# Patient Record
Sex: Female | Born: 1940 | Race: White | Hispanic: No | Marital: Married | Smoking: Former smoker
Health system: Southern US, Community
[De-identification: ages and names within clinical notes are randomized; demographics above are authoritative.]

## PROBLEM LIST (undated history)

## (undated) DIAGNOSIS — L723 Sebaceous cyst: Secondary | ICD-10-CM

## (undated) DIAGNOSIS — H353290 Exudative age-related macular degeneration, unspecified eye, stage unspecified: Secondary | ICD-10-CM

## (undated) DIAGNOSIS — K219 Gastro-esophageal reflux disease without esophagitis: Secondary | ICD-10-CM

## (undated) DIAGNOSIS — R32 Unspecified urinary incontinence: Secondary | ICD-10-CM

## (undated) DIAGNOSIS — J309 Allergic rhinitis, unspecified: Secondary | ICD-10-CM

## (undated) DIAGNOSIS — E559 Vitamin D deficiency, unspecified: Secondary | ICD-10-CM

## (undated) DIAGNOSIS — G4700 Insomnia, unspecified: Secondary | ICD-10-CM

## (undated) DIAGNOSIS — R010 Benign and innocent cardiac murmurs: Secondary | ICD-10-CM

## (undated) DIAGNOSIS — I10 Essential (primary) hypertension: Secondary | ICD-10-CM

## (undated) HISTORY — DX: Insomnia, unspecified: G47.00

## (undated) HISTORY — DX: Allergic rhinitis, unspecified: J30.9

## (undated) HISTORY — DX: Exudative age-related macular degeneration, unspecified eye, stage unspecified: H35.3290

## (undated) HISTORY — PX: CERVICAL SPINE SURGERY: SHX589

## (undated) HISTORY — DX: Unspecified urinary incontinence: R32

## (undated) HISTORY — PX: TUMOR REMOVAL: SHX12

## (undated) HISTORY — PX: APPENDECTOMY: SHX54

## (undated) HISTORY — DX: Gastro-esophageal reflux disease without esophagitis: K21.9

## (undated) HISTORY — DX: Essential (primary) hypertension: I10

## (undated) HISTORY — PX: BACK SURGERY: SHX140

## (undated) HISTORY — DX: Vitamin D deficiency, unspecified: E55.9

## (undated) HISTORY — DX: Benign and innocent cardiac murmurs: R01.0

## (undated) HISTORY — DX: Sebaceous cyst: L72.3

## (undated) HISTORY — PX: SHOULDER SURGERY: SHX246

---

## 1966-01-02 HISTORY — PX: PELVIC FRACTURE SURGERY: SHX119

## 1997-03-26 ENCOUNTER — Other Ambulatory Visit: Admission: RE | Admit: 1997-03-26 | Discharge: 1997-03-26 | Payer: Self-pay | Admitting: Gynecology

## 1998-05-03 ENCOUNTER — Other Ambulatory Visit: Admission: RE | Admit: 1998-05-03 | Discharge: 1998-05-03 | Payer: Self-pay | Admitting: Gynecology

## 1999-08-11 ENCOUNTER — Other Ambulatory Visit: Admission: RE | Admit: 1999-08-11 | Discharge: 1999-08-11 | Payer: Self-pay | Admitting: Gynecology

## 1999-12-06 ENCOUNTER — Other Ambulatory Visit: Admission: RE | Admit: 1999-12-06 | Discharge: 1999-12-06 | Payer: Self-pay | Admitting: Gynecology

## 2000-02-08 ENCOUNTER — Encounter: Admission: RE | Admit: 2000-02-08 | Discharge: 2000-03-02 | Payer: Self-pay | Admitting: Family Medicine

## 2000-09-10 ENCOUNTER — Other Ambulatory Visit: Admission: RE | Admit: 2000-09-10 | Discharge: 2000-09-10 | Payer: Self-pay | Admitting: Gynecology

## 2000-09-18 ENCOUNTER — Encounter: Admission: RE | Admit: 2000-09-18 | Discharge: 2000-09-18 | Payer: Self-pay | Admitting: Orthopaedic Surgery

## 2000-09-18 ENCOUNTER — Encounter: Payer: Self-pay | Admitting: Orthopaedic Surgery

## 2000-10-03 ENCOUNTER — Encounter: Admission: RE | Admit: 2000-10-03 | Discharge: 2000-10-03 | Payer: Self-pay | Admitting: Orthopaedic Surgery

## 2000-10-03 ENCOUNTER — Encounter: Payer: Self-pay | Admitting: Orthopaedic Surgery

## 2000-10-19 ENCOUNTER — Encounter: Payer: Self-pay | Admitting: Orthopaedic Surgery

## 2000-10-19 ENCOUNTER — Encounter: Admission: RE | Admit: 2000-10-19 | Discharge: 2000-10-19 | Payer: Self-pay | Admitting: Orthopaedic Surgery

## 2000-12-28 ENCOUNTER — Ambulatory Visit (HOSPITAL_COMMUNITY): Admission: RE | Admit: 2000-12-28 | Discharge: 2000-12-28 | Payer: Self-pay | Admitting: Neurological Surgery

## 2000-12-28 ENCOUNTER — Encounter: Payer: Self-pay | Admitting: Neurological Surgery

## 2001-01-08 ENCOUNTER — Encounter: Payer: Self-pay | Admitting: Neurological Surgery

## 2001-01-08 ENCOUNTER — Ambulatory Visit (HOSPITAL_COMMUNITY): Admission: RE | Admit: 2001-01-08 | Discharge: 2001-01-09 | Payer: Self-pay | Admitting: Neurological Surgery

## 2001-03-25 ENCOUNTER — Observation Stay (HOSPITAL_COMMUNITY): Admission: RE | Admit: 2001-03-25 | Discharge: 2001-03-26 | Payer: Self-pay | Admitting: Neurological Surgery

## 2001-03-25 ENCOUNTER — Encounter: Payer: Self-pay | Admitting: Neurological Surgery

## 2001-09-17 ENCOUNTER — Other Ambulatory Visit: Admission: RE | Admit: 2001-09-17 | Discharge: 2001-09-17 | Payer: Self-pay | Admitting: Gynecology

## 2002-03-21 ENCOUNTER — Encounter: Payer: Self-pay | Admitting: Orthopedic Surgery

## 2002-03-26 ENCOUNTER — Inpatient Hospital Stay (HOSPITAL_COMMUNITY): Admission: RE | Admit: 2002-03-26 | Discharge: 2002-03-29 | Payer: Self-pay | Admitting: Orthopedic Surgery

## 2002-11-17 ENCOUNTER — Other Ambulatory Visit: Admission: RE | Admit: 2002-11-17 | Discharge: 2002-11-17 | Payer: Self-pay | Admitting: Gynecology

## 2003-12-24 ENCOUNTER — Other Ambulatory Visit: Admission: RE | Admit: 2003-12-24 | Discharge: 2003-12-24 | Payer: Self-pay | Admitting: Gynecology

## 2004-08-12 ENCOUNTER — Ambulatory Visit (HOSPITAL_COMMUNITY): Admission: RE | Admit: 2004-08-12 | Discharge: 2004-08-12 | Payer: Self-pay | Admitting: *Deleted

## 2004-08-12 ENCOUNTER — Encounter (INDEPENDENT_AMBULATORY_CARE_PROVIDER_SITE_OTHER): Payer: Self-pay | Admitting: Specialist

## 2005-01-05 ENCOUNTER — Other Ambulatory Visit: Admission: RE | Admit: 2005-01-05 | Discharge: 2005-01-05 | Payer: Self-pay | Admitting: Gynecology

## 2006-02-05 ENCOUNTER — Other Ambulatory Visit: Admission: RE | Admit: 2006-02-05 | Discharge: 2006-02-05 | Payer: Self-pay | Admitting: Gynecology

## 2006-06-11 ENCOUNTER — Other Ambulatory Visit: Admission: RE | Admit: 2006-06-11 | Discharge: 2006-06-11 | Payer: Self-pay | Admitting: Gynecology

## 2006-11-26 ENCOUNTER — Other Ambulatory Visit: Admission: RE | Admit: 2006-11-26 | Discharge: 2006-11-26 | Payer: Self-pay | Admitting: Gynecology

## 2007-06-20 ENCOUNTER — Other Ambulatory Visit: Admission: RE | Admit: 2007-06-20 | Discharge: 2007-06-20 | Payer: Self-pay | Admitting: Gynecology

## 2008-02-03 ENCOUNTER — Inpatient Hospital Stay (HOSPITAL_COMMUNITY): Admission: EM | Admit: 2008-02-03 | Discharge: 2008-02-07 | Payer: Self-pay | Admitting: Emergency Medicine

## 2008-02-03 ENCOUNTER — Encounter: Admission: RE | Admit: 2008-02-03 | Discharge: 2008-02-03 | Payer: Self-pay | Admitting: Internal Medicine

## 2008-02-03 ENCOUNTER — Encounter (INDEPENDENT_AMBULATORY_CARE_PROVIDER_SITE_OTHER): Payer: Self-pay | Admitting: Surgery

## 2009-10-02 HISTORY — PX: COLONOSCOPY: SHX174

## 2010-02-02 HISTORY — PX: OTHER SURGICAL HISTORY: SHX169

## 2010-04-03 HISTORY — PX: DIAGNOSTIC MAMMOGRAM: HXRAD719

## 2010-04-19 LAB — COMPREHENSIVE METABOLIC PANEL
ALT: 15 U/L (ref 0–35)
AST: 23 U/L (ref 0–37)
Alkaline Phosphatase: 69 U/L (ref 39–117)
CO2: 27 mEq/L (ref 19–32)
Chloride: 92 mEq/L — ABNORMAL LOW (ref 96–112)
GFR calc Af Amer: >60 mL/min (ref >60)
GFR calc non Af Amer: >60 mL/min (ref >60)
Sodium: 131 mEq/L — ABNORMAL LOW (ref 135–145)
Total Bilirubin: 1.2 mg/dL (ref 0.3–1.2)

## 2010-04-19 LAB — CBC
Hemoglobin: 11.1 g/dL — ABNORMAL LOW (ref 12.0–15.0)
MCHC: 33.4 g/dL (ref 30.0–36.0)
MCV: 91.3 fL (ref 78.0–100.0)
Platelets: 136 10*3/uL — ABNORMAL LOW (ref 150–400)
RBC: 3.59 MIL/uL — ABNORMAL LOW (ref 3.87–5.11)
RBC: 4.55 MIL/uL (ref 3.87–5.11)
WBC: 12.1 10*3/uL — ABNORMAL HIGH (ref 4.0–10.5)
WBC: 7.7 10*3/uL (ref 4.0–10.5)
WBC: 8.6 10*3/uL (ref 4.0–10.5)

## 2010-04-19 LAB — DIFFERENTIAL
Basophils Absolute: 0 10*3/uL (ref 0.0–0.1)
Basophils Absolute: 0.1 10*3/uL (ref 0.0–0.1)
Eosinophils Absolute: 0 10*3/uL (ref 0.0–0.7)
Eosinophils Absolute: 0.1 10*3/uL (ref 0.0–0.7)
Eosinophils Relative: 1 % (ref 0–5)
Lymphocytes Relative: 10 % — ABNORMAL LOW (ref 12–46)
Lymphs Abs: 0.9 10*3/uL (ref 0.7–4.0)
Neutrophils Relative %: 84 % — ABNORMAL HIGH (ref 43–77)

## 2010-04-19 LAB — BASIC METABOLIC PANEL
CO2: 25 mEq/L (ref 19–32)
Calcium: 7.7 mg/dL — ABNORMAL LOW (ref 8.4–10.5)
GFR calc Af Amer: >60 mL/min (ref >60)
Sodium: 128 mEq/L — ABNORMAL LOW (ref 135–145)

## 2010-05-17 NOTE — H&P (Signed)
Brittney Stewart, Brittney Stewart             ACCOUNT NO.:  [id number]   MEDICAL RECORD NO.:  [id number]          PATIENT TYPE:  INP   LOCATION:  5118                         FACILITY:  [facility]   PHYSICIAN:  Thornton Park. Daphine Deutscher, MD  DATE OF BIRTH:  April 17, 1940   DATE OF ADMISSION:  02/03/2008  DATE OF DISCHARGE:                              HISTORY & PHYSICAL   ADMITTING PHYSICIAN:  Molli Hazard B. Daphine Deutscher, MD   PRIMARY CARE PHYSICIAN:  Dr. Ronne Binning.   CHIEF COMPLAINT:  Abdominal pain.   HISTORY OF PRESENT ILLNESS:  Brittney Stewart is a 67-year female patient with  significant past medical history for osteoarthritis and hypertension.  She began developing vague abdominal discomfort last Thursday.  By  Friday, she was having definite abdominal pain, diffuse in the  midabdomen, radiating out bilaterally.  On Friday evening, the patient  had a couple of episodes of nausea and vomiting and apparent high fever  greater than 101.  The patient thought she had a viral syndrome or food  poisoning, so did not seek immediate medical attention.  Unfortunately,  throughout the week, her pain increased in severity and she has  significant anorexia, although she was able to keep clear liquids down.  By this morning, her pain had moved more towards the right side, but it  was not definite right lower quadrant pain.  This prompted her to seek  attention from Dr. Ronne Binning who based on his clinical exam was concerned  she may have peritoneal signs and an acute abdomen, therefore sent her  for a CT scan.  This demonstrated an enlarged appendix, more centrally  located.  It did not appear to be perforated.  The patient was sent to  the ER.  Lab work revealed a white count of 12,100 without a left shift.  Surgical consultation has been requested for evaluate for acute  appendicitis.   REVIEW OF SYSTEMS:  As per the history of present illness, again the  patient's pain has been more centrally located in the abdomen.  She has  been able to keep down small amounts of clear liquids, no food.  She has  had significant anorexia and she had a couple of small BMs since the  onset of discomfort.   PAST MEDICAL HISTORY:  1. Hypertension.  2. Osteoarthritis.   PAST SURGICAL HISTORY:  1. Pelvic fracture with pinning in 1968 after she fell off a horse.  2. 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  3. Open lumbar laminectomy involving L5-L6.  4. Cervical laminectomy.  5. Humeral head replacement in 2000.   ALLERGIES:  PENICILLIN which causes hives.   CURRENT MEDICATIONS:  Benicar and Celebrex.   SOCIAL HISTORY:  No tobacco, limited social alcohol.  She continues to  work p.r.n. Psychologist, forensic.  She mainly helps care for her grandchild.   FAMILY MEDICAL HISTORY:  Noncontributory.   PHYSICAL EXAMINATION:  GENERAL:  A pleasant female patient complaining  of mid more localized today to the right side abdominal pain.  VITAL SIGNS:  Temperature 97.1, BP 110/67, pulse  76 and regular, and  respirations 16.  PSYCH:  The patient is alert and oriented x3.  Affect is appropriate to  current situation.  NEURO:  Cranial nerves II-XII are grossly intact.  She is moving all  extremities x4 without focal deficits.  Sclerae are noninjected,  nonicteric.  EARS, NOSE, AND THROAT:  Ears are symmetrical.  No otorrhea.  Nose is  midline.  No rhinorrhea.  Oral mucous membranes are pink, but dry.  No  palpable thyroid.  CHEST:  Bilateral lung sounds are clear to auscultation anteriorly.  Respiratory effort is nonlabored.  She is on room air with O2 sats 100%.  CARDIOVASCULAR:  Heart sounds are with a grade 1-2/6 systolic murmur,  best heard at the left sternal border, second intercostal space, does  not radiate up axilla.  Pulses are regular, not tachycardiac.  No JVD.  No peripheral edema.  ABDOMEN:  Soft.  Bowel sounds are present.  Abdomen is nondistended.  She is tender between the umbilicus  in the right lower quadrant.  She  has definite guarding with deep palpation.  No rebounding.  EXTREMITIES:  Symmetrical in appearance without cyanosis or clubbing.   LABORATORY DATA:  White count 12,100, hemoglobin 14.2, platelets  154,000; neutrophils 77%.   DIAGNOSTICS:  An EKG is pending.  Chest x-ray shows no acute process.  CT of the abdomen and pelvis as noted.   IMPRESSION:  Acute appendicitis.   PLAN:  1. Admit the patient, n.p.o. status, OR tonight with either Dr. Daphine Deutscher      or Dr. Ezzard Standing.  2. Treat symptoms and manage symptoms with IV fluid, IV morphine,      Phenergan, and Zofran.  3. Empiric antibiotics of Cipro and Flagyl due to the patient's      PENICILLIN allergy.  4. PAS hose, On call to OR for DVT prophylaxis.      Allison L. Gwyneth Sprout Daphine Deutscher, MD  Electronically Signed    ALE/MEDQ  D:  02/03/2008  T:  02/04/2008  Job:  [id number]   cc:   Dr. Ronne Binning

## 2010-05-17 NOTE — Op Note (Signed)
Brittney Stewart, Brittney Stewart             ACCOUNT NO.:  [id number]   MEDICAL RECORD NO.:  [id number]          PATIENT TYPE:  INP   LOCATION:  5118                         FACILITY:  [facility]   PHYSICIAN:  Sandria Bales. Ezzard Standing, M.D.  DATE OF BIRTH:  04/02/1940   DATE OF PROCEDURE:  02/03/2008  DATE OF DISCHARGE:                               OPERATIVE REPORT   Date of surgery ?   PREOPERATIVE DIAGNOSIS:  Acute appendicitis.   POSTOPERATIVE DIAGNOSIS:  Acute appendicitis with rupture and focal  abscess.   PROCEDURE:  Laparoscopic appendectomy.   SURGEON:  Sandria Bales. Ezzard Standing, MD   FIRST ASSISTANT:  None.   ANESTHESIA:  General endotracheal.   ESTIMATED BLOOD LOSS:  Minimal.   DESCRIPTION OF PROCEDURE:  Ms. Eblin is a 70 year old white female  patient of Dr. Thayer Headings, who presents with a 2-3-day history of  abdominal pain, which localized to her lower abdomen.  CT scan is  consistent with acute appendicitis.   I discussed with the patient about the indications and potential  complications of appendiceal surgery.  Potential complication include  bleeding, infection which I think she already has, the need for open  surgery, and possibility of bowel resection.   OPERATIVE NOTE:  The patient was placed in a supine position with a left  arm tucked, right arm out to side, Foley catheter in place, abdomen  prepped with CHG, and she was given cefoxitin at the initiation of the  procedure.   A time-out was held identifying the patient and procedure.  I accessed the abdominal cavity through an infraumbilical incision.  I  placed a 10-mm laparoscope through a 12-mm Hasson trocar and the Hasson  trocar was secured with a 0 Vicryl suture.  A 5-mm trocar was placed in  the right upper quadrant and an 11-mm trocar in the left lower quadrant  and abdominal exploration carried out.  Right and left lobes of liver  unremarkable.  Stomach unremarkable.  The bowel that I could see was  unremarkable  except in the right lower quadrant where she had an  inflamed mass.  I peeled omentum and small bowel back and stuck adjacent  to the right fallopian tube and ovary.  There was an inflammatory  mass/abscess, which was appendiceal abscess.   There was purulence around this.  I irrigated this area out.  I was able  to take the tip of the salpinx and tip of the mesentery down to the base  of the appendix.  I took a vascular load of the Endo-GIA 45 stapler  across the base of the appendix and divided the appendix.   I placed the appendix in the EndoCatch bag and delivered through the  umbilicus.  The midsegment of the appendix had ruptured.  There was a  phlebolith, which I found which was free floating.  I retrieved this  using the stone scooping device.   I then irrigated the abdomen with 2.5 L and tried to irrigate as much of  the purulence as I could as possible.  I saw no loculations.  Again, she  had a lot  of inflammation around her right tubo-ovarian complex and the  distal ileum.   I then removed the trocars.  In turn, the umbilical port was closed with  0 Vicryl suture.  The skin edge was closed with 5-0 Vicryl suture,  painted with tincture of benzoin and Steri-stripped.  The patient  tolerated the procedure well and was transported to recovery room in  good condition.  Sponge and needle counts were correct at the end of the  case.      Sandria Bales. Ezzard Standing, M.D.  Electronically Signed     DHN/MEDQ  D:  02/03/2008  T:  02/04/2008  Job:  [id number]   cc:   Thayer Headings, M.D.  Leatha Gilding. Mezer, M.D.  Courtney Paris, M.D.  Griffith Citron, M.D.

## 2010-05-17 NOTE — Discharge Summary (Signed)
Brittney Stewart, Brittney Stewart             ACCOUNT NO.:  [id number]   MEDICAL RECORD NO.:  [id number]          PATIENT TYPE:  INP   LOCATION:  5149                         FACILITY:  [facility]   PHYSICIAN:  Thornton Park. Daphine Deutscher, MD  DATE OF BIRTH:  05/11/1940   DATE OF ADMISSION:  02/03/2008  DATE OF DISCHARGE:  02/07/2008                               DISCHARGE SUMMARY   ADMITTING PHYSICIAN:  Sandria Bales. Ezzard Standing, MD   DISCHARGING PHYSICIAN:  Dr. Daphine Stewart.   CHIEF COMPLAINT AND REASON FOR ADMISSION:  Brittney Stewart is a 70 year old  female patient who began developing vague abdominal pain on Thursday  prior to presentation.  By the next day, she was having significant pain  associated with nausea and vomiting and fevers greater than 101.  The  patient told she had a viral syndrome, so did not seek immediate medical  attention.  Unfortunately, her symptoms had worsened and had become more  focalized to the right side.  By the date of admission, she saw her  family doctor, Dr. Gillermina Phy, who felt the patient may have an acute  abdomen plus appendicitis, so she was sent for an outpatient CT.  This  demonstrated an enlarged appendix, more centrally located, with a  phlegmonous change around it but did not appear to be perforated on the  CT scan.  Her white count was 12,100 from the doctor's office.  She was  sent to the ER for evaluation by Surgery for acute appendicitis.   On exam, the patient's vital signs were stable.  Her abdomen revealed a  soft abdomen that was nondistended with bowel sounds present, but she  was tender focally with guarding between the umbilicus and the right  lower quadrant.  No rebounding.   ADMITTING DIAGNOSIS:  Acute appendicitis.   HOSPITAL COURSE:  The patient was admitted, placed on n.p.o. status,  started on IV fluids, and given empiric Cipro and Flagyl due to having  the PENICILLIN allergy.  She was later evaluated by Dr. Ezzard Standing who  agreed that the patient had acute  appendicitis, and she was subsequently  taken to the OR on the day of admission where she underwent laparoscopic  appendectomy for ruptured appendicitis with focal abscess.  Pictures  were taken, are in the chart.  She was irrigated with 2-1/2 liters of  saline, tolerated the procedure well, and was sent back to the general  floor to recover.  Over the next several days, the patient progressed  nicely.  Her white count decreased to 8.6.  She was troubled initially  with a subtle ileus and high fevers up to 103 for the first 2 days, but  she did have bowel sounds present.  On postop day 1, she was actually  started on clear liquids and Toradol was added to help with her pain  management.  She was placed on cefoxitin in the postoperative period and  was continued up until discharge.   By postoperative day 3, the patient's diet was advanced to a solid.  She  was started on Vicodin and IV fluids were decreased.  Plans were to  possibly discharge home on postop day 4 with no problems.   On postop day 4, the patient was stable.  Vital signs were present.  She  had defervesced with no further fevers in over 24 hours.  The other  issue she had was, on postop day 3, she was complaining of a focal  papular rash that looked more like a small pimple/folliculitis on the  upper back.  This was very itchy in nature but did not appear to be  consistent with a drug reaction.  From a surgical standpoint, she was  tolerating a solid diet.  Her trocar sites were clean and dry.  There  was no redness, and plans were to discharge the patient home.   FINAL DISCHARGE DIAGNOSES:  1. Acute abdomen secondary to perforated appendicitis with      peritonitis.  2. Status post laparoscopic appendectomy on February 03, 2008, by Dr.      Ovidio Kin.  3. Mild upper back folliculitis.   DISCHARGE MEDICATIONS:  The patient will resume the following home  medications:  1. Benicar 40 mg daily.  2. Celebrex 200 mg  daily.  3. Aspirin 81 mg daily.  4. Allegra daily.  5. Vitamin C daily.  6. Vitamin D daily.  7. Vitamin B12 daily.  8. Calcium daily.  9. Zinc daily.  10.Co-Q10 daily.  11.Fish oil daily.  12.Simcor daily.   NEW MEDICATIONS:  1. Cipro 500 mg b.i.d. for the next 4 days.  2. Flagyl 500 mg t.i.d. for the next 4 days.  3. Vicodin 5/325 one to two tablets every 4 hours as needed for pain.  4. Over-the-counter ibuprofen 1-3 tablets every 8 hours as needed for      pain with instructions to not to take ibuprofen if she is taking      her Celebrex regularly.   ACTIVITY AND WOUND CARE:  Please refer to the home care instructions for  laparoscopic procedures from [facility].  She is to not return  to work and usual activities for 2 weeks.  No lifting greater than 10  pounds for 2 weeks.  She is to call the physician's office if she has  redness, drainage, or swelling in her wounds, temperature greater than  101 degrees Fahrenheit, or pain that is not relieved by prescribed  products.   FOLLOWUP:  She is to see [facility] at [facility] on February 18, 2008, at 3:15 p.m.  In addition, per Dr. Allene Pyo request, I have  given the patient a copy of her pathology report.  Her pathology shows  acute appendicitis without evidence of malignancy.      Brittney L. Gwyneth Sprout Daphine Deutscher, MD  Electronically Signed    ALE/MEDQ  D:  02/07/2008  T:  02/07/2008  Job:  [id number]   cc:   Sandria Bales. Ezzard Standing, M.D.  Dr. Gillermina Phy

## 2010-05-20 NOTE — Discharge Summary (Signed)
NAMEVESTAL, CRANDALL                         ACCOUNT NO.:  [id number]   MEDICAL RECORD NO.:  [id number]                   PATIENT TYPE:  INP   LOCATION:  5028                                 FACILITY:  [facility]   PHYSICIAN:  Dyke Brackett, M.D.                 DATE OF BIRTH:  Apr 07, 1940   DATE OF ADMISSION:  03/26/2002  DATE OF DISCHARGE:  03/29/2002                                 DISCHARGE SUMMARY   ADMISSION DIAGNOSES:  1. End-stage osteoarthritis, left shoulder.  2. Hypertension.  3. Hypercholesterolemia.   DISCHARGE DIAGNOSES:  1. Left glenohumeral hemiarthroplasty.  2. Left rotator cuff tear repair.  3. Hypertension.  4. Hypercholesterolemia.   HISTORY OF PRESENT ILLNESS:  A 70 year old white female who presents with  progressively worsening left shoulder pain over the past 10 years or so.  She does have a remote history of falling off a horse in 1968; however, she  is unsure if she injured her shoulder at that time.  She has had no other  surgery or injury to her left shoulder.   She describes her shoulder pain as a constant, dull sensation, located  mostly over the anterior aspect of the deltoid with radiation into the  pectoralis major, and the pain also radiates down into left arm.  The pain  is increased with activity and certain motion.  Also increased if she rides  in a car for a long period of time or if her shoulder __________.  Her pain  improves with analgesic rubbing cream to the shoulder and hot showers.  She  is currently taking Vicodin for the pain, and this gives her a moderate  amount of pain relief.  Left shoulder seems to be locking in certain  positions and popping and grinding.  The pain keeps her up at night.  She  has tried a cortisone injection in the past that provided her with moderate  relief; however, her last cortisone injection into the shoulder did not give  her any relief.   ALLERGIES:  PENICILLIN causes hives and edema.   CURRENT  MEDICATIONS:  1. Lotrel 5/10 mg one tab p.o. daily.  2. Maxzide 25 mg one tab p.o. daily.  3. Tri-Chlor 160 mg one tab p.o. daily.  4. Vioxx 25 mg one tab p.o. daily.  5. Allegra 180 mg one tab p.o. daily p.r.n.  6. Ambien 10 mg one tab p.o. q.h.s. p.r.n.  7. Vicodin 5/500 mg one tab p.o. q.h.s. p.r.n. pain.  8. Humibid L.A. 600 mg one tab p.o. daily p.r.n.  9. Flonase spray one squirt to each naris p.r.n. congestion.  10.      Vitamin B-100 Complex one tab p.o. daily.  11.      Vitamin C 1000 mg p.o. daily.  12.      Vitamin D 400 International Units p.o. daily.  13.      Vitamin E 1000  International Units p.o. daily.  14.      Glucosamine sulfate 15/1200 mg one tablet p.o. daily.  15.      Aspirin 81 mg one tablet p.o. daily.  16.      Calcium 600 mg one tab p.o. daily.  17.      Zinc 50 mg one tab p.o. daily.   SURGICAL PROCEDURE:  Patient was taken to the OR by Dr. Frederico Hamman,  assisted by Dr. Lewayne Bunting and Madilyn Fireman, P.A.-C.  Patient was placed  under general anesthesia, and a left glenohumeral hemiarthroplasty and  repair of rotator cuff tear were performed.  Patient tolerated the procedure  well, returned to the postoperative care unit in good, stable condition.   CONSULTS:  PT, OT, case management consults were all obtained during the  patient's hospital stay.   HOSPITAL COURSE:  Patient remained stable and afebrile postop.  She had no  complications during her stay.  She did develop postoperative anemia  secondary to surgery with H&H 9.3/27.8 on postoperative day one; however,  she was asymptomatic by the second postop day, her H&H was 9.8/28.7.  Patient did have a positive UA with 20 to 50 white blood cells and a  moderate amount of leukocyte esterase, and also positive for nitrites  preoperatively and was placed on antibiotics preoperatively.  UA was checked  while patient was in the hospital, and it came back negative.  On  postoperative day three, patient  was discharged home in good, stable  condition.   LABORATORY DATA:  CBC preoperatively, white blood count 5.2, hemoglobin  12.6, hematocrit 36.5, platelets 196.  Preoperative routine chemistries,  sodium 137, potassium 4.5, chloride 104, bicarb 27, glucose 95, BUN 24,  creatinine 0.8.  Preoperative PT was 13.1, INR was 0.9, and PTT was 29.   Preoperative UA on 03/21/2002 was positive for nitrites, leukocyte esterase  moderate, white blood cells 20 to 50.   Preoperative chest x-ray, no active disease, mild scarring noted at the  right lung base.  Preoperative EKG, sinus bradycardia; otherwise normal EKG, 56 beats per  minute, P-R intervals of 170 msec, P, R, and T axes of 71/61/55.   MEDICATIONS ON FLOOR:  1. Percocet 5 mg one to two tabs p.o. q.4-6h. p.r.n. pain.  2. Norvasc 5 mg p.o. daily.  3. Lotensin 10 mg p.o. daily.  4. Maxzide/Diazide one tab p.o. daily.  5. Tri-Chlor 160 mg p.o. q.18h.  6. Vioxx 25 mg p.o. daily.  7. Allegra 180 mg p.o. daily.  8. Guaifenesin 600 mg p.o. daily.  9. Cleocin 600 mg IV times three days.  10.      Flonase one squirt to each naris p.r.n. congestion.   DISCHARGE MEDICATIONS:  Patient is to resume home meds and to add the  following:  1. Aspirin 81 mg, enteric-coated, one daily.  2. OxyContin 10 mg SR one tablet every 12 hours as needed for pain.  3. Percocet 5 mg one to two tabs every four to six hours as needed for pain.   ACTIVITY:  No overhead movements with left arm.   DIET:  No restrictions.   CONDITION ON DISCHARGE:  Patient is discharged in good condition to home.   FOLLOW UP:  Patient is to follow up with Dr. Madelon Lips in approximately 10  days for staple removal; patient is to call office for appointment, [phone].   WOUND CARE:  1. Keep wound clean and dry and change dressings daily.  2.  Check wound daily for signs of infection.  Call Dr. Candise Bowens office if    temperature greater than 100.5, chills, pain not controlled with  meds,     foul-smelling discharge from the wound, or if swelling occurs.  3. Patient may shower after two days with no drainage from the wound site.   SPECIAL INSTRUCTIONS:  Patient is to perform pendulum exercises three times  per day.  She may take her arm out of the sling to move her elbow.     Richardean Canal, Arnetha Courser, M.D.    GC/MEDQ  D:  04/29/2002  T:  04/29/2002  Job:  [id number]

## 2010-05-20 NOTE — Op Note (Signed)
Brittney Stewart, Brittney Stewart                         ACCOUNT NO.:  [id number]   MEDICAL RECORD NO.:  [id number]                   PATIENT TYPE:  INP   LOCATION:  2899                                 FACILITY:  [facility]   PHYSICIAN:  Thera Flake., M.D.             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   POSTOPERATIVE DIAGNOSIS:  1. 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  2. Small tear, rotator cuff.   OPERATION PERFORMED:  1. Left glenohumeral hemiarthroplasty, global shoulder with 40 x 21 head and     size 10 stem.  2. Repair of rotator cuff tear.   SURGEON:  Dyke Brackett, M.D.   ASSISTANT:  Claude Manges. Cleophas Dunker, M.D.   ESTIMATED BLOOD LOSS:  Approximately 150 ml.   DESCRIPTION OF PROCEDURE:  Extended deltopectoral approach was made with  partial release and later repair of the pectoralis muscle.  0 degrees of  external rotation was present, extreme amount of osteophytes, particularly  along the anterior edge of the humerus.  A severe head deformity was present  as well.  Glenoid fortunately appeared to be in reasonably good shape with  only mild fibrillation of the posterior one third of the glenoid.  Decision  made at that point to proceed with a hemiarthroplasty.  Small rent in the  rotator cuff was repaired on closing the shoulder with nonabsorbable  sutures.  Again, a deltopectoral approach was made with splitting of the  clavipectoral fascia, retraction of conjoined tendon medially, dissection of  the capsule away from the subscapularis muscle with tagging of the  subscapularis.  The head was cut resecting a very minimal amount of bone,  given the severe head deformity, about 45 degrees relative to the shaft and  about 25 to 30 degrees of  retroversion.  This made for essentially an  anatomic position of the humeral head and once it was reduced with trial  reduction with the arm at neutral.  The canal was progressively sized with a  hand reamer up to a size 10 stem, followed by placement of broaches and the  trial of the same size.  Again the top of the head was slightly above the  edge of the cuff.  There was good bone fit relative to the head coverage of  the cut as well as appropriate version.  Final prosthesis was inserted.  Initially the middle thickness head was used.  In point of fact, the  prosthesis itself may have impacted slightly down compared to the trial.  For this reason, the 21 head had to be used instead of the 17 head; however,  with the arm at neutral, the head could be only 50% subluxed.  There was no  overstuffing  of the joint present.  The prosthesis did not dislocate  posteriorly with the arm internally rotated and with the subscapularis  muscle trial apposed to the bone, there was good anterior stability.  Prior  to placement of the prosthesis, loops of suture were placed through drill  holes in order  to later reattach the subscapularis muscle to bone holes as well as to  direct suture techniques to the tendon.  A small cuff tear was repaired and  the wound prior to this was irrigated.  2-0 Vicryl, 0 Vicryl in the  subcutaneous tissue, skin staples were used on the skin.  Marcaine without  epinephrine to the skin and shoulder immobilizer was applied.                                                Thera Flake., M.D.    WDC/MEDQ  D:  03/26/2002  T:  03/26/2002  Job:  [id number]

## 2010-05-20 NOTE — Op Note (Signed)
Brittney Stewart, COSTANTINO             ACCOUNT NO.:  [id number]   MEDICAL RECORD NO.:  [id number]          PATIENT TYPE:  AMB   LOCATION:  ENDO                         FACILITY:  [facility]   PHYSICIAN:  Georgiana Spinner, M.D.    DATE OF BIRTH:  12-19-1940   DATE OF PROCEDURE:  08/12/2004  DATE OF DISCHARGE:                                 OPERATIVE REPORT   PROCEDURE:  Upper endoscopy with biopsy.   INDICATIONS:  Gastroesophageal reflux disease.   ANESTHESIA:  Demerol 40, Versed 5 mg.   DESCRIPTION OF PROCEDURE:  With the patient mildly sedated in the left  lateral decubitus position, the Olympus videoscopic endoscope was inserted  in the mouth, passed under direct vision through the esophagus which  appeared normal except the question of Barrett's seen in the distal  esophagus which was photographed and subsequently biopsied. We then entered  into the stomach, fundus, body, antrum, duodenal bulb, second portion of  duodenum were visualized. From this point, the endoscope was slowly  withdrawn taking circumferential views of the duodenal mucosa until the  endoscope had been pulled back into the stomach, placed in retroflexion to  view the stomach from below. The endoscope was straightened and withdrawn  taking circumferential views of the remaining gastric and esophageal mucosa  stopping in the antrum to biopsy an erythematous folds. The patient's vital  signs and pulse oximeter remained stable. The patient tolerated the  procedure well without apparent complications.   FINDINGS:  Erythematous antral fold biopsied, question of Barrett's  esophagus with widely patent GE junction indicating laxity of the  gastroesophageal sphincter.   PLAN:  Await biopsy report. The patient will call me for results and follow-  up with me as an outpatient. Proceed to colonoscopy.       GMO/MEDQ  D:  08/12/2004  T:  08/12/2004  Job:  [id number]

## 2010-05-20 NOTE — Op Note (Signed)
[place]  Patient:    Brittney Stewart, Brittney Stewart Visit Number: [id number] MRN: [id number]          Service Type: DSU Location: 3000 3019 [id number] Attending Physician:  Jonne Ply Dictated by:   Brittney Stewart, M.D. Proc. 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  OPERATION PERFORMED:  Anterior cervical diskectomy and arthrodesis, C5-6, C6-7, structural allograft, Synthes plate fixation.  SURGEON:  Brittney Stewart, M.D.  ASSISTANT:  Dr. Newell Coral.  ANESTHESIA:  General endotracheal.  INDICATIONS FOR PROCEDURE:  The patient is a 70 year old individual who has had significant neck, shoulder and arm pain particularly with weakness in the left shoulder.  She has profound cord compression at the C5-6 level and she has weakness in the deltoid and biceps and supraspinatus muscle on that left side.  She is advised regarding surgical decompression at C5-6 and  C6-7 where spondylitic process was particularly severe.  DESCRIPTION OF PROCEDURE:  The patient was brought to the operating room and placed on the table in supine position.  After smooth induction of general endotracheal anesthesia she was placed in five pounds of halter traction.  The neck was prepped with DuraPrep and draped in a sterile fashion.  A transverse incision was made on the left side of the neck and carried down to the platysma.  The plane between the sternocleidomastoid and strap muscles was dissected bluntly until the prevertebral space was reached.  The first identifiable disk space was noted to be that of C5-6 on a radiograph. Dissection was then carried down to C6-7 and then by placing Caspar retractors under the longus  colli muscle, the C5-6 disk space was opened.  Ventral osteophytes were removed with a Leksell rongeur.  The disk space was evacuated of a significant quantity of markedly degenerated disk material.  The decompression was then obtained by removing large bony osteophytes from the inferior margin of the body of C5 and the uncinate process particularly on th4 left side at the C5-6 level.  Both sides were cleared and subligamentous disk space was cleared also.  Once the posterior longitudinal ligament was identified and hemostasis was achieved in this region, the space was checked for its smoothness on either surface and a 7 mm tricortical graft was placed with the cortical surface facing dorsally.  The ventral aspects of the bone graft were trimmed.  C6-7 was treated in a similar fashion.  Here significant osteophytic process was again noted more prominent on the left side than on the right side.  Once again, plates were cleaned completely.  Again, a 7 mm tricortical graft was placed in an inverted fashion with the cortical surface facing dorsally.  After ventral aspects were prepared, a 40 mm standard size Synthes plate was contoured to the appropriate size and shape and fixed with six locking 4 x 14 mm screws which were drilled and tapped individually. Hemostasis in the soft tissue was then obtained.  A localizing radiograph identified good position of the construct.  The area was checked again for hemostasis and the wound was closed with 3-0 Vicryl in the platysma and 3-0 Vicryl in the subcuticular tissues.  The patient  tolerated the procedure well and was returned to the recovery room in stable condition. Dictated by:   Brittney Stewart, M.D. Attending Physician:  Jonne Ply DD:  01/08/01 TD:  01/08/01 Job: [id number]

## 2010-05-20 NOTE — Op Note (Signed)
Brittney Stewart, Brittney Stewart             ACCOUNT NO.:  [id number]   MEDICAL RECORD NO.:  [id number]          PATIENT TYPE:  AMB   LOCATION:  ENDO                         [facility]ACILITY:  [facility]   PHYSICIAN:  Georgiana Spinner, M.D.    DATE OF BIRTH:  1940-10-25   DATE OF PROCEDURE:  08/12/2004  DATE OF DISCHARGE:                                 OPERATIVE REPORT   PROCEDURE:  Colonoscopy.   INDICATIONS:  Colon cancer screening.   ANESTHESIA:  Versed 2 mg.   DESCRIPTION OF PROCEDURE:  With the patient mildly sedated in the left  lateral decubitus position, the Olympus videoscopic colonoscope was inserted  in the rectum and passed under direct vision to cecum identified by the  ileocecal valve and appendiceal orifice both of which were photographed.  From this point, the colonoscope was slowly withdrawn taking circumferential  views of colonic mucosa after first entering into the terminal ileum which  was photographed and appeared normal. The endoscope was then as stated  withdrawn all the way to the rectum which appeared normal on direct and  retroflexed view. The endoscope was straightened and withdrawn. The  patient's vital signs and pulse oximeter remained stable. The patient  tolerated the procedure well without apparent complication.   FINDINGS:  Unremarkable examination including terminal ileum.   PLAN:  Consider repeat examination in five years       GMO/MEDQ  D:  08/12/2004  T:  08/12/2004  Job:  [id number]

## 2010-05-20 NOTE — H&P (Signed)
NAME:  BETSAIDA, [place]                       ACCOUNT NO.:  [id number]   MEDICAL RECORD NO.:  [id number]                   PATIENT TYPE:  INP   LOCATION:  NA                                   FACILITY:  [facility]   PHYSICIAN:  Marcie Mowers, M.D.             DATE OF BIRTH:  05/07/1940   DATE OF ADMISSION:  03/26/2002  DATE OF DISCHARGE:                                HISTORY & PHYSICAL   CHIEF COMPLAINT:  Left shoulder pain for the last ten years.   HISTORY OF PRESENT ILLNESS:  This 70 year old white female patient presents  to Dr. Madelon Lips with a history of gradual onset of progressively worsening  left shoulder pain over the last ten or so years.  She does have a remote  history of falling off of a horse in 1968, and she is not sure if she  injured her shoulder at that time, but she has had no other surgery or other  injury to her shoulder.   At this point, the pain in her shoulder is described as a constant dull  sensation located mostly over the anterior aspect of the deltoid with  radiation into the pectoris major and then also distally down her arm into  her left hand.  The pain increases with activities and certain motions and  then also if she rides in a car for a long period of time or if the shoulder  gets cold.  The pain does decrease with a hot shower and also rubbing  analgesic cream to the shoulder.  She is currently taking Vicodin for pain  and that provides a moderate amount of relief even though she is only using  that at bedtime.  She does complain of popping and grinding of the shoulder  and also of the shoulder locking in a certain position at times where she  has to move it to get it moving again.  The pain also keeps her up at night.  She has had cortisone injections in the past in that shoulder and they  provided a moderate amount of relief until the last time when it really did  not help at all.  She does play piano and she has had difficulty with that  because of the shoulder.   ALLERGIES:  PENICILLIN causes hives and edema.   CURRENT MEDICATIONS:  1. Lotrel 5/10 mg one tablet p.o. daily.  2. Maxzide 25 mg one tablet p.o. daily.  3. TriCor 160 mg one tablet p.o. daily.  4. Vioxx 25 mg one tablet p.o. daily.  5. Allegra 180 mg one tablet p.o. daily p.r.n.  6. Ambien 10 mg one tablet p.o. q. bedtime p.r.n.  7. Vicodin 5/500 mg one tablet p.o. q. bedtime p.r.n. pain.  8. Humibid LA 600 mg one tablet p.o. daily p.r.n.  9. Flonase nasal spray one squirt to each naris p.r.n. congestion.  10.      Vitamin B  100 complex one tablet p.o. daily.  11.      Vitamin C 1000 mg p.o. daily.  12.      Vitamin D 400 international units p.o. daily.  13.      Vitamin E 1000 international units p.o. daily.  14.      Chondroitin and glucosamine sulfate 1500/1200 mg one tablet p.o.     daily.  15.      Aspirin 81 mg one tablet p.o. daily.  Last dose prior to March 18, 2002.  16.      Calcium 600 mg one tablet p.o. daily.  17.      Zinc 50 mg one tablet p.o. daily.   PAST MEDICAL HISTORY:  She was diagnosed with hypertension 15 years ago.  She has mild hypercholesterolemia which has been improved with her  medications.  She denies any history of diabetes mellitus, thyroid disease,  hiatal hernia, peptic ulcer disease, reflux disease, asthma, or any other  chronic medical condition other than noted previously.   PAST SURGICAL HISTORY:  1. Tonsillectomy in 1950.  2. Extraction of wisdom teeth in 1967.  3. Open reduction of pelvic fractures in 1968, by Dr. Delford Field and Dr. Fannie Knee.  4. Natural childbirth in 1969, by Dr. Arletha Grippe.  5. 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     __________.  6. Repair of a deviated septum by Dr. Shon Hough in 1982.  7. C4-5 cervical fusion by Dr. Barnett Abu in January 2003.  8. Bilateral lumbar decompression of L4-5 and S1 by Dr. Barnett Abu.  9. Removal of left eye cataract with lens implant  in 1998, by Dr. Emmit Pomfret.  10.      Excision of a bone spur on the right toes by Dr. Charlsie Merles.   She denies any complications from the above-mentioned procedures.   SOCIAL HISTORY:  She has about a five pack-year history of cigarette smoking  which she quit in 1968.  She drinks alcohol about two glasses a week.  She  does not use any drugs.  She is married and has one son.  She and her  husband live in a one story house in [place] and a two story house in  [place] with a couple of steps into the main entrance.  She is a retired  Technical sales engineer and Engineer, agricultural.  Her medical doctor is Dr. Aggie Cosier, and his  phone number is [phone].   FAMILY HISTORY:  Her mother is alive at age 16 with osteoarthritis,  hypertension, and hypercholesterolemia.  Her father passed away at age 70  with a stroke and hypertension.  She has three sisters, ages 56, 75, and 37,  and they are healthy.  Her son is age 33 and he is healthy.   REVIEW OF SYSTEMS:  She did have a problem with blurred vision due to  Claritin.  She has seasonal allergies in the spring and fall.  She does have  a history of pneumonia but nothing recent.  She complains of occasional  constipation treated easily with over-the-counters.  She does complain of  occasional urinary frequency and stress incontinence.  She does wear  glasses.  She does not have a living will nor a power-of-attorney.  All  other systems are negative and noncontributory.   PHYSICAL EXAMINATION:  GENERAL:  This is a well-developed, well-nourished,  thin white female in no acute distress.  She talks easily with the examiner.  She walks with a normal gait.  Mood and affect are appropriate.  VITAL SIGNS;  Height 5 feet 8 inches, weight 170 pounds, body mass index 25.  Temperature 95.9 degrees Fahrenheit, pulse 64, respirations 18, and BP  114/52.  HEENT:  Normocephalic and atraumatic without frontal or maxillary sinus tenderness to palpation.  Conjunctivae pink.   Sclerae anicteric.  PERRLA.  EOMs intact.  No visible external ear deformities.  Hearing grossly intact.  Right ear canal occluded with cerumen.  Left tympanic membrane pearly gray.  Nose and nasal septum midline.  Nasal mucosa pink and moist without exudates  or polyps noted.  Buccal mucosa pink and moist.  Good dentition.  Pharynx  without erythema or exudates.  Tongue and uvula midline.  Tongue without  fasciculations.  Uvula rises equally with phonation.  NECK:  She has a well-healed, left-sided, very faint scar from the base of  her neck.  Trachea midline.  No palpable lymphadenopathy nor thyromegaly.  Carotids +2 bilaterally without bruits.  Full range of motion.  Nontender to  palpation along the cervical spine.  CARDIOVASCULAR:  Heart rate and rhythm regular.  S1 and S2 present without  rubs, clicks, or murmurs noted.  RESPIRATORY:  Respirations even and unlabored.  Breath sounds clear to  auscultation bilaterally without rales or wheezes noted.  ABDOMEN:  Rounded abdominal contour.  Bowel sounds present x4 quadrants.  Soft and nontender to palpation without hepatosplenomegaly or CVA  tenderness.  Femoral pulses +2 bilaterally.  BACK:  Nontender to palpation along the entire length of the vertebral  column.  BREASTS:  Deferred at this time.  GENITOURINARY:  Deferred at this time.  RECTAL:  Deferred at this time.  PELVIC:  Deferred at this time.  MUSCULOSKELETAL:  No obvious deformities of the bilateral upper extremities.  She has full range of motion of her right shoulder and arm without pain.  She has full range of motion of the left elbow, wrists, and fingers.  The  radial pulses are +2.  No pain with palpation about the shoulders  bilaterally.  She does have some crepitance with range of motion of the left  shoulder.  She only has active forward flexion of that left shoulder to 30  degrees.  Passively I can get it to 90.  Abduction stops on that left  shoulder at about 30  degrees and she has minimal to no internal or external  rotation.  It is intact over the shoulder and no real pain with palpation  about the shoulder at this time.  She has full range of motion of her hips,  knees, ankles, and toes bilaterally.  DP and PT pulses are +2.  No lower  extremity edema.  NEUROLOGICAL:  Alert and oriented x3.  Cranial nerves II-XII are grossly  intact.  Strength 5/5 in the bilateral upper and lower extremities.  Rapid  alternating movements intact.  Deep tendon reflexes 2+ in the bilateral  upper and lower extremities.  Sensation intact to light touch.   RADIOLOGICAL DATA:  X-rays taken in September 2002, of her left shoulder  showed considerable osteoarthritis of the humeral head with a large inferior  osteophyte and sclerosis in addition to flattening of the head.  Repeat  x-  ray and MRI in June 2003, showed an end-stage osteoarthritic shoulder with  large osteophytes and significant restriction of motion.  IMPRESSION:  1. End-stage osteoarthritis, left shoulder.  2. Hypertension.  3. Hypercholesterolemia.   PLAN:  The patient will  be admitted to [facility] on March 26, 2002, where she will undergo a left total versus hemishoulder arthroplasty  by Dr. Lacretia Nicks. Frederico Hamman.  She will undergo all of the routine preoperative  laboratory tests and studies prior to this procedure.     Legrand Pitts Duffy, P.A.-C                    Marcie Mowers, M.D.    KED/MEDQ  D:  03/18/2002  T:  03/18/2002  Job:  [id number]

## 2010-05-20 NOTE — Op Note (Signed)
[place]  Patient:    Brittney Stewart, Brittney Stewart Visit Number: [id number] MRN: [id number]          Service Type: SUR Location: 3000 3032 01 Attending Physician:  Jonne Ply Dictated by:   Stefani Dama, M.D. Proc. 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  PROCEDURE:  Lumbar laminectomy, bilateral foraminotomies with Met-Rx and microscope using microdissection technique, L4-L5.  SURGEON:  Stefani Dama, M.D.  FIRST ASSISTANT:  Kyle L. Franky Macho, M.D.  ANESTHESIA:  General endotracheal.  INDICATIONS:  The patient is a 70 year old individual who has had significant back and bilateral leg pain.  She has also had some incipient weakness of the lower extremities.  She has a very stenosis at the L4-L5 level.  She has been advised as to surgical intervention.  DESCRIPTION OF PROCEDURE:  The patient was brought to the operating room, supine on the stretcher.  After the onset of general endotracheal anesthesia, she was turned prone.  The back was shaved, prepped with DuraPrep, and draped in a sterile fashion.  Using fluoroscopic guidance, then L4-L5 was localized, first in the PA plane and then in the lateral plane.  The central area of the back was then infiltrated with 1% lidocaine with epinephrine for a total of 4 cc.  A linear incision was created in this region, measuring approximately 25 mm in length.  Subcutaneous dissection was then obtained, and hemostasis in the subcutaneous tissues was obtained.  Then by dissecting to the right side and then to the left side, the paraspinous musculature was opened, and a K-wire was placed down to the laminar arch of L4.  Using a winding technique, a series  of dilators were then placed, first on the left side at L4-5, and an 18 mm x 5 cm deep endoscopic cannula was placed and fixed to the operating table.  Microscope was then used with microdissection technique.  The laminar arch of L4 was cleared and using a high-speed air drill with 2.3 mm dissecting tool, the inferior arch of the lamina of L4 was removed out to the mesial wall of the facet.  Thickened, redundant, yellow ligament was encountered in this area, and this was taken up with a 2 mm and a 3 mm Kerrison punch.  Common dural tube was ultimately identified and then after removing further ligamentous overgrowth, the common dural tube was relieved of a significant epidural compression.  Lateral recess was identified, and the takeoff of the L5 nerve root was similarly identified and decompressed of thickened, overgrown, ilioligamentus material.  Once the dissection was completed both cephalad and inferiorly, the soft tissues were checked for hemostasis, endoscopic cannula was removed, and a similar procedure was then carried out on the right side.  Again, microdissection technique was used to remove the laminar arch, the redundant, yellow ligament.  This ligament was noted to be thicker and much more involved in causing compression on the right side than it was on the left side.  In the end, similar decompression was carried out. Once this was achieved, the endoscopic cannula was removed.  A singular 3-0 stitch was placed in each side of the fascia, and then the  subcutaneous tissue were closed with 3-0 Vicryl in interrupted fashion, and 3-0 Vicryl was used to close the subcuticular skin.  The patient tolerated the procedure well and was returned to the recovery room in stable condition. Dictated by:   Stefani Dama, M.D. Attending Physician:  Jonne Ply DD:  03/25/01 TD:  03/26/01 Job: [id number]

## 2011-11-27 ENCOUNTER — Encounter (INDEPENDENT_AMBULATORY_CARE_PROVIDER_SITE_OTHER): Payer: Self-pay | Admitting: General Surgery

## 2011-12-04 ENCOUNTER — Ambulatory Visit (INDEPENDENT_AMBULATORY_CARE_PROVIDER_SITE_OTHER): Payer: BC Managed Care – PPO | Admitting: General Surgery

## 2011-12-04 ENCOUNTER — Encounter (INDEPENDENT_AMBULATORY_CARE_PROVIDER_SITE_OTHER): Payer: Self-pay | Admitting: General Surgery

## 2011-12-04 VITALS — BP 136/70 | HR 60 | Temp 97.0°F | Resp 18 | Ht 67.0 in | Wt 180.8 lb

## 2011-12-04 DIAGNOSIS — J309 Allergic rhinitis, unspecified: Secondary | ICD-10-CM

## 2011-12-04 DIAGNOSIS — L723 Sebaceous cyst: Secondary | ICD-10-CM

## 2011-12-04 DIAGNOSIS — K219 Gastro-esophageal reflux disease without esophagitis: Secondary | ICD-10-CM

## 2011-12-04 DIAGNOSIS — E785 Hyperlipidemia, unspecified: Secondary | ICD-10-CM

## 2011-12-04 DIAGNOSIS — I10 Essential (primary) hypertension: Secondary | ICD-10-CM

## 2011-12-04 NOTE — Patient Instructions (Signed)
The small lump on your left shoulder is probably an epidermoid cyst or sebaceous cyst. It sounds like this has been intermittently infected.  You will be scheduled for excision of this area under local anesthesia in the near future.

## 2011-12-04 NOTE — Progress Notes (Signed)
Patient ID: Brittney Stewart, female   DOB: 1940-06-25, 71 y.o.   MRN: [id number]  Chief Complaint  Patient presents with  . Routine Post Op    Left shoulder cyst    HPI Brittney Stewart is a 71 y.o. female.  She is referred by Dr. Thayer Headings   at [facility] for evaluation and management of a chronically inflamed cyst of the left shoulder.  This very pleasant woman states that she has had a lump on her left shoulder for many years. This will intermittently become enlarged greater than 2 cm. Intermittently will drain fluid or sebaceous material and then subside.   She has never had any surgical procedure or drainage procedure.. It is not acutely infected today, but she would like to have something done because it is under her strap area.  Past history is significant for hypertension, hyperlipidemia, GERD, left neck fusion surgery, left shoulder reconstruction, laparoscopic appendectomy by Dr. Ezzard Standing HPI  Past Medical History  Diagnosis Date  . GERD (gastroesophageal reflux disease)   . Hypertension   . Vitamin D deficiency   . Urine incontinence   . Benign heart murmur   . Inflamed sebaceous cyst   . Allergic rhinitis, cause unspecified   . Insomnia, unspecified     Past Surgical History  Procedure Date  . Colonoscopy 10/11  . Diagnostic mammogram 04/2010  . Dexa scan 02/2010    No family history on file.  Social History History  Substance Use Topics  . Smoking status: Former Smoker    Quit date: 11/27/1967  . Smokeless tobacco: Not on file  . Alcohol Use:     Allergies  Allergen Reactions  . Penicillin V Potassium     Current Outpatient Prescriptions  Medication Sig Dispense Refill  . Ascorbic Acid (VITAMIN C PO) Take by mouth daily.      . Aspirin 81 MG EC tablet Take 81 mg by mouth daily.      Marland Kitchen CALCIUM PO Take by mouth daily.      . celecoxib (CELEBREX) 200 MG capsule Take 200 mg by mouth daily.      . Cholecalciferol (VITAMIN D3 SUPER STRENGTH) 2000  UNITS TABS Take by mouth daily.      . Cinnamon 500 MG capsule Take 500 mg by mouth daily.      . Coenzyme Q10 (CO Q 10 PO) Take by mouth daily.      . fexofenadine (ALLEGRA) 180 MG tablet Take 180 mg by mouth daily.      . fluticasone (FLONASE) 50 MCG/ACT nasal spray Place 2 sprays into the nose as needed.      . Glucosamine-Chondroit-Vit C-Mn (GLUCOSAMINE CHONDR 1500 COMPLX PO) Take by mouth 2 (two) times daily.       . Magnesium Oxide 250 MG TABS Take by mouth daily.      . niacin-simvastatin (SIMCOR) 500-20 MG 24 hr tablet Take 1 tablet by mouth at bedtime.      Marland Kitchen olmesartan-hydrochlorothiazide (BENICAR HCT) 40-12.5 MG per tablet Take 1 tablet by mouth daily.      . Omega-3 Fatty Acids (FISH OIL) 1200 MG CAPS Take by mouth 2 (two) times daily.      Marland Kitchen omeprazole (PRILOSEC) 20 MG capsule Take 20 mg by mouth 2 (two) times daily.      . potassium chloride (K-DUR,KLOR-CON) 10 MEQ tablet Take 10 mEq by mouth 2 (two) times daily.      . vitamin B-12 (CYANOCOBALAMIN) 1000 MCG tablet Take 1,000 mcg  by mouth daily.      Marland Kitchen zolpidem (AMBIEN) 10 MG tablet Take 10 mg by mouth at bedtime as needed. 1/2 Prn      . Multiple Vitamins-Minerals (ZINC PO) Take by mouth daily.        Review of Systems Review of Systems  Constitutional: Negative for fever, chills and unexpected weight change.  HENT: Negative for hearing loss, congestion, sore throat, trouble swallowing and voice change.   Eyes: Negative for visual disturbance.  Respiratory: Negative for cough and wheezing.   Cardiovascular: Negative for chest pain, palpitations and leg swelling.  Gastrointestinal: Negative for nausea, vomiting, abdominal pain, diarrhea, constipation, blood in stool, abdominal distention and anal bleeding.  Genitourinary: Negative for hematuria, vaginal bleeding and difficulty urinating.  Musculoskeletal: Negative for arthralgias.  Skin: Positive for wound. Negative for rash.  Neurological: Negative for seizures, syncope and  headaches.  Hematological: Negative for adenopathy. Does not bruise/bleed easily.  Psychiatric/Behavioral: Negative for confusion.    Blood pressure 136/70, pulse 60, temperature 97 F (36.1 C), temperature source Temporal, resp. rate 18, height 5\' 7"  (1.702 m), weight 180 lb 12.8 oz (82.01 kg).  Physical Exam Physical Exam  Constitutional: She is oriented to person, place, and time. She appears well-developed and well-nourished. No distress.  HENT:  Head: Normocephalic and atraumatic.  Eyes: Conjunctivae normal and EOM are normal. Pupils are equal, round, and reactive to light. Left eye exhibits no discharge. No scleral icterus.  Neck: Neck supple. No JVD present. No tracheal deviation present. No thyromegaly present.       Left anterior neck scar  Cardiovascular: Normal rate, regular rhythm, normal heart sounds and intact distal pulses.   No murmur heard. Pulmonary/Chest: Effort normal and breath sounds normal. No respiratory distress. She has no wheezes. She has no rales. She exhibits no tenderness.  Musculoskeletal: She exhibits no edema and no tenderness.       Scar anterior to the left shoulder and deltoid  area well healed.  Lymphadenopathy:    She has no cervical adenopathy.  Neurological: She is alert and oriented to person, place, and time. She exhibits normal muscle tone. Coordination normal.  Skin: Skin is warm. No rash noted. She is not diaphoretic. No erythema. No pallor.       1 cm subcutaneous mass left shoulder, superiorly located, chronic discoloration, nontender, no acute infection or inflammation noted.  Psychiatric: She has a normal mood and affect. Her behavior is normal. Judgment and thought content normal.    Data Reviewed Dr. Zebedee Iba office notes  Assessment    Chronic sebaceous cyst left shoulder. History of suggest intermittent bacterial infection. Elective excision of this soft tissue mass it is reasonable to high likelihood of recurrent infection in  the future and location under [employer]  Hypertension  Hyperlipidemia  GERD  History left shoulder rotator cuff repair, open  history left neck fusion surgery  History laparoscopic appendectomy    Plan    Scheduled for excision of soft tissue mass left shoulder, 1 cm, in the future. I think this can be done under local anesthesia. Possibly we'll be able to schedule this in the office  I discussed the indications, details, techniques, and numerous risks of the surgery with her. She understands the differential diagnosis. She understands all these issues. Her questions were answered. She agrees with this plan.       Angelia Mould. Derrell Lolling, M.D., [facility] General and Minimally invasive Surgery Breast and Colorectal Surgery Office:  [phone] Pager:   [phone]  12/04/2011, 10:22 AM

## 2012-01-09 ENCOUNTER — Other Ambulatory Visit (INDEPENDENT_AMBULATORY_CARE_PROVIDER_SITE_OTHER): Payer: Self-pay | Admitting: General Surgery

## 2012-01-09 ENCOUNTER — Encounter (INDEPENDENT_AMBULATORY_CARE_PROVIDER_SITE_OTHER): Payer: Self-pay | Admitting: General Surgery

## 2012-01-09 ENCOUNTER — Ambulatory Visit (INDEPENDENT_AMBULATORY_CARE_PROVIDER_SITE_OTHER): Payer: BC Managed Care – PPO | Admitting: General Surgery

## 2012-01-09 VITALS — BP 142/76 | HR 74 | Temp 98.1°F | Resp 18 | Ht 67.0 in | Wt 182.5 lb

## 2012-01-09 DIAGNOSIS — M7989 Other specified soft tissue disorders: Secondary | ICD-10-CM

## 2012-01-09 DIAGNOSIS — L723 Sebaceous cyst: Secondary | ICD-10-CM

## 2012-01-09 NOTE — Progress Notes (Signed)
Patient ID: Brittney Stewart, female   DOB: 05-25-1940, 72 y.o.   MRN: [id number]  The patient returns to the office for excision of the chronically inflamed cyst on the top of her left shoulder.  Procedure note: Patient taken to room 11. Surgical time out performed. Position on operating table. Left shoulder prepped and draped in sterile fashion. 1% Xylocaine with epinephrine used as local anesthetic. Transverse elliptical incision in Langer's lines performed. Excise skin and subcutaneous mass. Sent to pathology. Hemostasis excellent. Subcutaneous tissue closed with 3-0 Vicryl and skin closed with running subcuticular 4-0 Vicryl and Dermabond. Tolerated well.  Assessment: Chronically inflamed sebaceous cyst left shoulder, 1.5 cm  Plan: Excision performed today Wound care discussed and written instructions given Call in 3 days for pathology report. Expect benign.   call if signs of bleeding swelling or infection. Tylenol or Advil for pain. Otherwise RTO as needed.   Angelia Mould. Derrell Lolling, M.D., [facility] General and Minimally invasive Surgery Breast and Colorectal Surgery Office:   [phone] Pager:   [phone]

## 2012-01-09 NOTE — Patient Instructions (Signed)
Call Dr. Jacinto Halim office on Thursday to get the final pathology report.  You may shower in 24 hours. No swimming pool or hot tub for 3 weeks.  Avoid sports, impacts, or strenuous activities for about 2 weeks. You may otherwise perform  normal daily activities.  Call Dr. Derrell Lolling if there is any evidence of redness, drainage, swelling, bleeding, or infection.  Take Advil or Tylenol for the pain. The pain should not be bad.

## 2012-01-10 ENCOUNTER — Telehealth (INDEPENDENT_AMBULATORY_CARE_PROVIDER_SITE_OTHER): Payer: Self-pay | Admitting: General Surgery

## 2012-01-10 NOTE — Progress Notes (Signed)
Quick Note:  Inform patient of Pathology report,. "benign"... ______

## 2012-01-10 NOTE — Telephone Encounter (Signed)
Called patient per Dr. Derrell Lolling and advised the results were benign. Patient advised to please call if she displays any signs of infection or complications. Patient agreed.

## 2013-01-23 ENCOUNTER — Other Ambulatory Visit: Payer: Self-pay | Admitting: Rheumatology

## 2013-01-23 DIAGNOSIS — M549 Dorsalgia, unspecified: Secondary | ICD-10-CM

## 2013-01-27 ENCOUNTER — Ambulatory Visit
Admission: RE | Admit: 2013-01-27 | Discharge: 2013-01-27 | Disposition: A | Discharge status: Home / Self Care | Payer: BC Managed Care – PPO | Source: Ambulatory Visit | Attending: Rheumatology | Admitting: Rheumatology

## 2013-01-27 DIAGNOSIS — M549 Dorsalgia, unspecified: Secondary | ICD-10-CM

## 2013-01-27 MED ORDER — METHYLPREDNISOLONE ACETATE 40 MG/ML INJ SUSP (RADIOLOG
120.0000 mg | Freq: Once | INTRAMUSCULAR | Status: AC
Start: 2013-01-27 — End: 2013-01-27
  Administered 2013-01-27: 120 mg via INTRA_ARTICULAR

## 2013-01-27 MED ORDER — IOHEXOL 180 MG/ML  SOLN
1.0000 mL | Freq: Once | INTRAMUSCULAR | Status: AC | PRN
  Administered 2013-01-27: 1 mL via INTRA_ARTICULAR

## 2013-01-27 NOTE — Discharge Instructions (Signed)

## 2014-02-12 ENCOUNTER — Other Ambulatory Visit: Payer: Self-pay | Admitting: Gynecology

## 2014-02-16 LAB — CYTOLOGY - PAP

## 2014-10-21 ENCOUNTER — Emergency Department (HOSPITAL_COMMUNITY)
Admission: EM | Admit: 2014-10-21 | Discharge: 2014-10-21 | Disposition: A | Discharge status: Home / Self Care | Payer: BLUE CROSS/BLUE SHIELD | Attending: Emergency Medicine | Admitting: Emergency Medicine

## 2014-10-21 ENCOUNTER — Encounter (HOSPITAL_COMMUNITY): Payer: Self-pay | Admitting: *Deleted

## 2014-10-21 DIAGNOSIS — R011 Cardiac murmur, unspecified: Secondary | ICD-10-CM | POA: Insufficient documentation

## 2014-10-21 DIAGNOSIS — Z79899 Other long term (current) drug therapy: Secondary | ICD-10-CM | POA: Insufficient documentation

## 2014-10-21 DIAGNOSIS — Z7951 Long term (current) use of inhaled steroids: Secondary | ICD-10-CM | POA: Insufficient documentation

## 2014-10-21 DIAGNOSIS — Z7982 Long term (current) use of aspirin: Secondary | ICD-10-CM | POA: Diagnosis not present

## 2014-10-21 DIAGNOSIS — Z112 Encounter for screening for other bacterial diseases: Secondary | ICD-10-CM | POA: Insufficient documentation

## 2014-10-21 DIAGNOSIS — K219 Gastro-esophageal reflux disease without esophagitis: Secondary | ICD-10-CM | POA: Insufficient documentation

## 2014-10-21 DIAGNOSIS — Z87891 Personal history of nicotine dependence: Secondary | ICD-10-CM | POA: Diagnosis not present

## 2014-10-21 DIAGNOSIS — I10 Essential (primary) hypertension: Secondary | ICD-10-CM | POA: Insufficient documentation

## 2014-10-21 LAB — CBC WITH DIFFERENTIAL/PLATELET
BASOS ABS: 0 10*3/uL (ref 0.0–0.1)
Basophils Relative: 0 %
Eosinophils Absolute: 0.2 10*3/uL (ref 0.0–0.7)
Eosinophils Relative: 3 %
HEMATOCRIT: 43.3 % (ref 36.0–46.0)
Hemoglobin: 14.9 g/dL (ref 12.0–15.0)
LYMPHS PCT: 31 %
Lymphs Abs: 1.6 10*3/uL (ref 0.7–4.0)
MCH: 30.4 pg (ref 26.0–34.0)
MCHC: 34.4 g/dL (ref 30.0–36.0)
MCV: 88.4 fL (ref 78.0–100.0)
Monocytes Absolute: 0.4 10*3/uL (ref 0.1–1.0)
Monocytes Relative: 8 %
NEUTROS ABS: 3 10*3/uL (ref 1.7–7.7)
Neutrophils Relative %: 58 %
Platelets: 136 10*3/uL — ABNORMAL LOW (ref 150–400)
RBC: 4.9 MIL/uL (ref 3.87–5.11)
RDW: 12.4 % (ref 11.5–15.5)
WBC: 5.1 10*3/uL (ref 4.0–10.5)

## 2014-10-21 LAB — URINE MICROSCOPIC-ADD ON

## 2014-10-21 LAB — BASIC METABOLIC PANEL
ANION GAP: 8 (ref 5–15)
BUN: 14 mg/dL (ref 6–20)
CHLORIDE: 102 mmol/L (ref 101–111)
CO2: 28 mmol/L (ref 22–32)
Calcium: 9.3 mg/dL (ref 8.9–10.3)
Creatinine, Ser: 0.56 mg/dL (ref 0.44–1.00)
GFR calc Af Amer: >60 mL/min (ref >60)
GLUCOSE: 99 mg/dL (ref 65–99)
POTASSIUM: 3.6 mmol/L (ref 3.5–5.1)
SODIUM: 138 mmol/L (ref 135–145)

## 2014-10-21 LAB — URINALYSIS, ROUTINE W REFLEX MICROSCOPIC
Bilirubin Urine: NEGATIVE
Glucose, UA: NEGATIVE mg/dL
Hgb urine dipstick: NEGATIVE
Ketones, ur: NEGATIVE mg/dL
LEUKOCYTES UA: NEGATIVE
NITRITE: NEGATIVE
PH: 7 (ref 5.0–8.0)
Protein, ur: 30 mg/dL — AB
SPECIFIC GRAVITY, URINE: 1.005 (ref 1.005–1.030)
Urobilinogen, UA: 0.2 mg/dL (ref 0.0–1.0)

## 2014-10-21 MED ORDER — HYDRALAZINE HCL 20 MG/ML IJ SOLN
10.0000 mg | Freq: Once | INTRAMUSCULAR | Status: AC
  Administered 2014-10-21: 10 mg via INTRAVENOUS
  Filled 2014-10-21: qty 1

## 2014-10-21 MED ORDER — IRBESARTAN 300 MG PO TABS
300.0000 mg | ORAL_TABLET | Freq: Every day | ORAL | Status: DC
  Administered 2014-10-21: 300 mg via ORAL
  Filled 2014-10-21: qty 1

## 2014-10-21 MED ORDER — HYDROCHLOROTHIAZIDE 25 MG PO TABS
25.0000 mg | ORAL_TABLET | Freq: Every day | ORAL | Status: DC
Start: 2014-10-21 — End: 2014-10-21
  Administered 2014-10-21: 25 mg via ORAL
  Filled 2014-10-21: qty 1

## 2014-10-21 MED ORDER — HYDROCHLOROTHIAZIDE 12.5 MG PO TABS: 12.5000 mg | ORAL_TABLET | Freq: Every day | ORAL | Status: DC

## 2014-10-21 NOTE — ED Provider Notes (Signed)
CSN: [id number]     Arrival date & time 10/21/14  1257 History   First MD Initiated Contact with Patient 10/21/14 1259     Chief Complaint  Patient presents with  . Hypertension     (Consider location/radiation/quality/duration/timing/severity/associated sxs/prior Treatment) HPI Comments: Patient presents to the emergency department for evaluation of elevated blood pressure. Patient reports that she has noticed that she has been experiencing a slight headache for the last 4 or 5 days. She took her blood pressure today because of the headache and noted that it was very elevated. Patient reports that her blood pressure is normally well controlled on her blood pressure medication. She has not had any blurred vision. There is no chest pain, heart palpitations, shortness of breath.  Patient is a 74 y.o. female presenting with hypertension.  Hypertension Associated symptoms include headaches.    Past Medical History  Diagnosis Date  . GERD (gastroesophageal reflux disease)   . Hypertension   . Vitamin D deficiency   . Urine incontinence   . Benign heart murmur   . Inflamed sebaceous cyst   . Allergic rhinitis, cause unspecified   . Insomnia, unspecified    Past Surgical History  Procedure Laterality Date  . Colonoscopy  10/11  . Diagnostic mammogram  04/2010  . Dexa scan  02/2010   No family history on file. Social History  Substance Use Topics  . Smoking status: Former Smoker    Quit date: 11/27/1967  . Smokeless tobacco: None  . Alcohol Use: None   OB History    No data available     Review of Systems  Neurological: Positive for headaches.  All other systems reviewed and are negative.     Allergies  Penicillin v potassium  Home Medications   Prior to Admission medications   Medication Sig Start Date End Date Taking? Authorizing Provider  Ascorbic Acid (VITAMIN C PO) Take by mouth daily.   Yes Historical Provider, MD  Aspirin 81 MG EC tablet Take 81 mg by mouth  daily.   Yes Historical Provider, MD  CALCIUM PO Take 500 mg by mouth daily.    Yes Historical Provider, MD  celecoxib (CELEBREX) 200 MG capsule Take 200 mg by mouth daily.   Yes Historical Provider, MD  Cholecalciferol (VITAMIN D3 SUPER STRENGTH) 2000 UNITS TABS Take by mouth daily.   Yes Historical Provider, MD  Cinnamon 500 MG capsule Take 500 mg by mouth daily.   Yes Historical Provider, MD  Coenzyme Q10 (CO Q 10 PO) Take by mouth daily.   Yes Historical Provider, MD  cyclobenzaprine (FLEXERIL) 10 MG tablet Take 10 mg by mouth 3 (three) times daily as needed for muscle spasms.   Yes Historical Provider, MD  fexofenadine (ALLEGRA) 180 MG tablet Take 180 mg by mouth daily as needed for allergies.    Yes Historical Provider, MD  fluticasone (FLONASE) 50 MCG/ACT nasal spray Place 2 sprays into the nose as needed for allergies.    Yes Historical Provider, MD  Glucosamine-Chondroit-Vit C-Mn (GLUCOSAMINE CHONDR 1500 COMPLX PO) Take by mouth 2 (two) times daily.    Yes Historical Provider, MD  Magnesium Oxide 250 MG TABS Take 500 mg by mouth daily.    Yes Historical Provider, MD  montelukast (SINGULAIR) 10 MG tablet Take 10 mg by mouth at bedtime.   Yes Historical Provider, MD  Multiple Vitamins-Minerals (PRESERVISION AREDS 2 PO) Take 2 capsules by mouth daily.   Yes Historical Provider, MD  Multiple Vitamins-Minerals (ZINC PO) Take  by mouth daily.   Yes Historical Provider, MD  olmesartan-hydrochlorothiazide (BENICAR HCT) 40-12.5 MG per tablet Take 1 tablet by mouth daily.   Yes Historical Provider, MD  Omega-3 Fatty Acids (FISH OIL) 1200 MG CAPS Take 1 capsule by mouth 2 (two) times daily.    Yes Historical Provider, MD  omeprazole (PRILOSEC) 20 MG capsule Take 20 mg by mouth 2 (two) times daily.   Yes Historical Provider, MD  potassium chloride (K-DUR,KLOR-CON) 10 MEQ tablet Take 10 mEq by mouth daily.    Yes Historical Provider, MD  vitamin B-12 (CYANOCOBALAMIN) 1000 MCG tablet Take 1,000 mcg by  mouth daily.   Yes Historical Provider, MD  zolpidem (AMBIEN) 10 MG tablet Take 10 mg by mouth at bedtime as needed for sleep.    Yes Historical Provider, MD   BP 202/84 mmHg  Pulse 61  Temp(Src) 98.2 F (36.8 C) (Oral)  Resp 17  Ht 5\' 7"  (1.702 m)  Wt 174 lb (78.926 kg)  BMI 27.25 kg/m2  SpO2 98% Physical Exam  Constitutional: She is oriented to person, place, and time. She appears well-developed and well-nourished. No distress.  HENT:  Head: Normocephalic and atraumatic.  Right Ear: Hearing normal.  Left Ear: Hearing normal.  Nose: Nose normal.  Mouth/Throat: Oropharynx is clear and moist and mucous membranes are normal.  Eyes: Conjunctivae and EOM are normal. Pupils are equal, round, and reactive to light.  Neck: Normal range of motion. Neck supple.  Cardiovascular: Regular rhythm, S1 normal and S2 normal.  Exam reveals no gallop and no friction rub.   No murmur heard. Pulmonary/Chest: Effort normal and breath sounds normal. No respiratory distress. She exhibits no tenderness.  Abdominal: Soft. Normal appearance and bowel sounds are normal. There is no hepatosplenomegaly. There is no tenderness. There is no rebound, no guarding, no tenderness at McBurney's point and negative Murphy's sign. No hernia.  Musculoskeletal: Normal range of motion.  Neurological: She is alert and oriented to person, place, and time. She has normal strength. No cranial nerve deficit or sensory deficit. Coordination normal. GCS eye subscore is 4. GCS verbal subscore is 5. GCS motor subscore is 6.  Skin: Skin is warm, dry and intact. No rash noted. No cyanosis.  Psychiatric: She has a normal mood and affect. Her speech is normal and behavior is normal. Thought content normal.  Nursing note and vitals reviewed.   ED Course  Procedures (including critical care time) Labs Review Labs Reviewed  BASIC METABOLIC PANEL  CBC WITH DIFFERENTIAL/PLATELET  URINALYSIS, ROUTINE W REFLEX MICROSCOPIC (NOT AT [facility])     Imaging Review No results found. I have personally reviewed and evaluated these images and lab results as part of my medical decision-making.   EKG Interpretation None      MDM   Final diagnoses:  None   hypertension  Patient presented to the ER for evaluation of elevated blood pressure. Patient did have blood pressure of 238/89 at arrival to the ER. She has a normal neurologic exam. Basic labs ordered to evaluate for renal function. Workup has been unremarkable. Patient administered IV hydralazine with significant improvement of her blood pressure. Will refer her back to her primary care doctor for repeat blood pressure check. She is currently on Benicar HCT 40/12.5. Will add additional 12.5 mg of hydrochlorothiazide.    Orpah Greek, MD 10/21/14 [id number]

## 2014-10-21 NOTE — Discharge Instructions (Signed)
Hypertension Hypertension, commonly called high blood pressure, is when the force of blood pumping through your arteries is too strong. Your arteries are the blood vessels that carry blood from your heart throughout your body. A blood pressure reading consists of a higher number over a lower number, such as 110/72. The higher number (systolic) is the pressure inside your arteries when your heart pumps. The lower number (diastolic) is the pressure inside your arteries when your heart relaxes. Ideally you want your blood pressure below 120/80. Hypertension forces your heart to work harder to pump blood. Your arteries may become narrow or stiff. Having untreated or uncontrolled hypertension can cause heart attack, stroke, kidney disease, and other problems. RISK FACTORS Some risk factors for high blood pressure are controllable. Others are not.  Risk factors you cannot control include:   Race. You may be at higher risk if you are African American.  Age. Risk increases with age.  Gender. Men are at higher risk than women before age 45 years. After age 65, women are at higher risk than men. Risk factors you can control include:  Not getting enough exercise or physical activity.  Being overweight.  Getting too much fat, sugar, calories, or salt in your diet.  Drinking too much alcohol. SIGNS AND SYMPTOMS Hypertension does not usually cause signs or symptoms. Extremely high blood pressure (hypertensive crisis) may cause headache, anxiety, shortness of breath, and nosebleed. DIAGNOSIS To check if you have hypertension, your health care provider will measure your blood pressure while you are seated, with your arm held at the level of your heart. It should be measured at least twice using the same arm. Certain conditions can cause a difference in blood pressure between your right and left arms. A blood pressure reading that is higher than normal on one occasion does not mean that you need treatment. If  it is not clear whether you have high blood pressure, you may be asked to return on a different day to have your blood pressure checked again. Or, you may be asked to monitor your blood pressure at home for 1 or more weeks. TREATMENT Treating high blood pressure includes making lifestyle changes and possibly taking medicine. Living a healthy lifestyle can help lower high blood pressure. You may need to change some of your habits. Lifestyle changes may include:  Following the DASH diet. This diet is high in fruits, vegetables, and whole grains. It is low in salt, red meat, and added sugars.  Keep your sodium intake below 2,300 mg per day.  Getting at least 30-45 minutes of aerobic exercise at least 4 times per week.  Losing weight if necessary.  Not smoking.  Limiting alcoholic beverages.  Learning ways to reduce stress. Your health care provider may prescribe medicine if lifestyle changes are not enough to get your blood pressure under control, and if one of the following is true:  You are 18-59 years of age and your systolic blood pressure is above 140.  You are 60 years of age or older, and your systolic blood pressure is above 150.  Your diastolic blood pressure is above 90.  You have diabetes, and your systolic blood pressure is over 140 or your diastolic blood pressure is over 90.  You have kidney disease and your blood pressure is above 140/90.  You have heart disease and your blood pressure is above 140/90. Your personal target blood pressure may vary depending on your medical conditions, your age, and other factors. HOME CARE INSTRUCTIONS    Have your blood pressure rechecked as directed by your health care provider.   Take medicines only as directed by your health care provider. Follow the directions carefully. Blood pressure medicines must be taken as prescribed. The medicine does not work as well when you skip doses. Skipping doses also puts you at risk for  problems.  Do not smoke.   Monitor your blood pressure at home as directed by your health care provider. SEEK MEDICAL CARE IF:   You think you are having a reaction to medicines taken.  You have recurrent headaches or feel dizzy.  You have swelling in your ankles.  You have trouble with your vision. SEEK IMMEDIATE MEDICAL CARE IF:  You develop a severe headache or confusion.  You have unusual weakness, numbness, or feel faint.  You have severe chest or abdominal pain.  You vomit repeatedly.  You have trouble breathing. MAKE SURE YOU:   Understand these instructions.  Will watch your condition.  Will get help right away if you are not doing well or get worse.   This information is not intended to replace advice given to you by your health care provider. Make sure you discuss any questions you have with your health care provider.   Document Released: 12/19/2004 Document Revised: 05/05/2014 Document Reviewed: 10/11/2012 Elsevier Interactive Patient Education 2016 [employer]  

## 2014-10-21 NOTE — ED Notes (Signed)
Pt reports elevated BP and headache since Friday. Pt states that she is unsure if she has taken her medications today.

## 2014-12-25 ENCOUNTER — Encounter (HOSPITAL_COMMUNITY): Payer: Self-pay | Admitting: Emergency Medicine

## 2014-12-25 ENCOUNTER — Emergency Department (HOSPITAL_COMMUNITY)
Admission: EM | Admit: 2014-12-25 | Discharge: 2014-12-26 | Disposition: A | Discharge status: Home / Self Care | Payer: BLUE CROSS/BLUE SHIELD | Attending: Emergency Medicine | Admitting: Emergency Medicine

## 2014-12-25 DIAGNOSIS — G4700 Insomnia, unspecified: Secondary | ICD-10-CM | POA: Insufficient documentation

## 2014-12-25 DIAGNOSIS — Z872 Personal history of diseases of the skin and subcutaneous tissue: Secondary | ICD-10-CM | POA: Insufficient documentation

## 2014-12-25 DIAGNOSIS — R609 Edema, unspecified: Secondary | ICD-10-CM | POA: Insufficient documentation

## 2014-12-25 DIAGNOSIS — M25511 Pain in right shoulder: Secondary | ICD-10-CM | POA: Diagnosis not present

## 2014-12-25 DIAGNOSIS — E559 Vitamin D deficiency, unspecified: Secondary | ICD-10-CM | POA: Diagnosis not present

## 2014-12-25 DIAGNOSIS — Z79899 Other long term (current) drug therapy: Secondary | ICD-10-CM | POA: Insufficient documentation

## 2014-12-25 DIAGNOSIS — Z880 Allergy status to penicillin: Secondary | ICD-10-CM | POA: Insufficient documentation

## 2014-12-25 DIAGNOSIS — Z7982 Long term (current) use of aspirin: Secondary | ICD-10-CM | POA: Diagnosis not present

## 2014-12-25 DIAGNOSIS — Z8709 Personal history of other diseases of the respiratory system: Secondary | ICD-10-CM | POA: Insufficient documentation

## 2014-12-25 DIAGNOSIS — I10 Essential (primary) hypertension: Secondary | ICD-10-CM | POA: Insufficient documentation

## 2014-12-25 DIAGNOSIS — Z87891 Personal history of nicotine dependence: Secondary | ICD-10-CM | POA: Insufficient documentation

## 2014-12-25 DIAGNOSIS — R010 Benign and innocent cardiac murmurs: Secondary | ICD-10-CM | POA: Insufficient documentation

## 2014-12-25 DIAGNOSIS — K219 Gastro-esophageal reflux disease without esophagitis: Secondary | ICD-10-CM | POA: Insufficient documentation

## 2014-12-25 LAB — BASIC METABOLIC PANEL
Anion gap: 9 (ref 5–15)
BUN: 16 mg/dL (ref 6–20)
CO2: 30 mmol/L (ref 22–32)
Calcium: 10.1 mg/dL (ref 8.9–10.3)
Chloride: 102 mmol/L (ref 101–111)
Creatinine, Ser: 0.68 mg/dL (ref 0.44–1.00)
Glucose, Bld: 126 mg/dL — ABNORMAL HIGH (ref 65–99)
POTASSIUM: 4 mmol/L (ref 3.5–5.1)
SODIUM: 141 mmol/L (ref 135–145)

## 2014-12-25 LAB — CBC
HEMATOCRIT: 44.7 % (ref 36.0–46.0)
Hemoglobin: 15.1 g/dL — ABNORMAL HIGH (ref 12.0–15.0)
MCH: 30.5 pg (ref 26.0–34.0)
MCHC: 33.8 g/dL (ref 30.0–36.0)
MCV: 90.3 fL (ref 78.0–100.0)
Platelets: 150 10*3/uL (ref 150–400)
RBC: 4.95 MIL/uL (ref 3.87–5.11)
RDW: 12.8 % (ref 11.5–15.5)
WBC: 3.5 10*3/uL — AB (ref 4.0–10.5)

## 2014-12-25 LAB — I-STAT TROPONIN, ED: TROPONIN I, POC: 0 ng/mL (ref 0.00–0.08)

## 2014-12-25 MED ORDER — TRAMADOL HCL 50 MG PO TABS: 50.0000 mg | ORAL_TABLET | Freq: Two times a day (BID) | ORAL | Status: DC | PRN

## 2014-12-25 MED ORDER — TRAMADOL HCL 50 MG PO TABS
50.0000 mg | ORAL_TABLET | Freq: Once | ORAL | Status: AC
  Administered 2014-12-25: 50 mg via ORAL
  Filled 2014-12-25: qty 1

## 2014-12-25 NOTE — Discharge Instructions (Signed)
How to Take Your Blood Pressure Ms. Charette, your blood work, EKG today were normal.  Continue to take your pain medication and high blood pressure medication as instructed.  Take tramadol for severe pain.  See your doctor within 3-5 days for close follow up.  If symptoms worsen, come back to the ED immediately. Thank you. HOW DO I GET A BLOOD PRESSURE MACHINE?  You can buy an electronic home blood pressure machine at your local pharmacy. Insurance will sometimes cover the cost if you have a prescription.  Ask your doctor what type of machine is best for you. There are different machines for your arm and your wrist.  If you decide to buy a machine to check your blood pressure on your arm, first check the size of your arm so you can buy the right size cuff. To check the size of your arm:   Use a measuring tape that shows both inches and centimeters.   Wrap the measuring tape around the upper-middle part of your arm. You may need someone to help you measure.   Write down your arm measurement in both inches and centimeters.   To measure your blood pressure correctly, it is important to have the right size cuff.   If your arm is up to 13 inches (up to 34 centimeters), get an adult cuff size.  If your arm is 13 to 17 inches (35 to 44 centimeters), get a large adult cuff size.    If your arm is 17 to 20 inches (45 to 52 centimeters), get an adult thigh cuff.  WHAT DO THE NUMBERS MEAN?   There are two numbers that make up your blood pressure. For example: 120/80.  The first number (120 in our example) is called the "systolic pressure." It is a measure of the pressure in your blood vessels when your heart is pumping blood.  The second number (80 in our example) is called the "diastolic pressure." It is a measure of the pressure in your blood vessels when your heart is resting between beats.  Your doctor will tell you what your blood pressure should be. WHAT SHOULD I DO BEFORE I CHECK MY  BLOOD PRESSURE?   Try to rest or relax for at least 30 minutes before you check your blood pressure.  Do not smoke.  Do not have any drinks with caffeine, such as:  Soda.  Coffee.  Tea.  Check your blood pressure in a quiet room.  Sit down and stretch out your arm on a table. Keep your arm at about the level of your heart. Let your arm relax.  Make sure that your legs are not crossed. HOW DO I CHECK MY BLOOD PRESSURE?  Follow the directions that came with your machine.  Make sure you remove any tight-fitting clothing from your arm or wrist. Wrap the cuff around your upper arm or wrist. You should be able to fit a finger between the cuff and your arm. If you cannot fit a finger between the cuff and your arm, it is too tight and should be removed and rewrapped.  Some units require you to manually pump up the arm cuff.  Automatic units inflate the cuff when you press a button.  Cuff deflation is automatic in both models.  After the cuff is inflated, the unit measures your blood pressure and pulse. The readings are shown on a monitor. Hold still and breathe normally while the cuff is inflated.  Getting a reading takes less than  a minute.  Some models store readings in a memory. Some provide a printout of readings. If your machine does not store your readings, keep a written record.  Take readings with you to your next visit with your doctor.   This information is not intended to replace advice given to you by your health care provider. Make sure you discuss any questions you have with your health care provider.   Document Released: 12/02/2007 Document Revised: 01/09/2014 Document Reviewed: 02/13/2013 Elsevier Interactive Patient Education 2016 [employer] Joint Pain Joint pain can be caused by many things. The joint can be bruised, infected, weak from aging, or sore from exercise. The pain will probably go away if you follow your doctor's instructions for home care. If your  joint pain continues, more tests may be needed to help find the cause of your condition. HOME CARE Watch your condition for any changes. Follow these instructions as told to lessen the pain that you are feeling:  Take medicines only as told by your doctor.  Rest the sore joint for as long as told by your doctor. If your doctor tells you to, raise (elevate) the painful joint above the level of your heart while you are sitting or lying down.  Do not do things that cause pain or make the pain worse.  If told, put ice on the painful area:  Put ice in a plastic bag.  Place a towel between your skin and the bag.  Leave the ice on for 20 minutes, 2-3 times per day.  Wear an elastic bandage, splint, or sling as told by your doctor. Loosen the bandage or splint if your fingers or toes lose feeling (become numb) and tingle, or if they turn cold and blue.  Begin exercising or stretching the joint as told by your doctor. Ask your doctor what types of exercise are safe for you.  Keep all follow-up visits as told by your doctor. This is important. GET HELP IF:  Your pain gets worse and medicine does not help it.  Your joint pain does not get better in 3 days.  You have more bruising or swelling.  You have a fever.  You lose 10 pounds (4.5 kg) or more without trying. GET HELP RIGHT AWAY IF:  You are not able to move the joint.  Your fingers or toes become numb or they turn cold and blue.   This information is not intended to replace advice given to you by your health care provider. Make sure you discuss any questions you have with your health care provider.   Document Released: 12/07/2008 Document Revised: 01/09/2014 Document Reviewed: 09/30/2013 Elsevier Interactive Patient Education [employer].

## 2014-12-25 NOTE — ED Provider Notes (Signed)
CSN: [id number]     Arrival date & time 12/25/14  2138 History  By signing my name below, I, Brittney Stewart, attest that this documentation has been prepared under the direction and in the presence of [name], MD . Electronically Signed: [name], Scribe. 12/25/2014. 11:26 PM.    Chief Complaint  Patient presents with  . Hypertension    The history is provided by the patient. No language interpreter was used.   HPI Comments:  Brittney Stewart is a 74 y.o. female with a history of HTN, who presents to the Emergency Department complaining of elevated BP x 1 day. She states she's checked her BP multiple times today and it seemed to be continuously increasing. She reports a systolic pressure of [id number]; states she called the pharmacy who advised she take an extra dose of her bencar hctz ~ 1950. Pharmacist also advised pt to come to ED if BP did not improve. Pt denies CP, HA, and SOB. At this time her only complaint is right sided shoulder  pain; states she's she has taken cyclobenzaprine and tylenol for her pain with mild relief.   Pt has an appointment with PCP on 12/28/14  Past Medical History  Diagnosis Date  . GERD (gastroesophageal reflux disease)   . Hypertension   . Vitamin D deficiency   . Urine incontinence   . Benign heart murmur   . Inflamed sebaceous cyst   . Allergic rhinitis, cause unspecified   . Insomnia, unspecified    Past Surgical History  Procedure Laterality Date  . Colonoscopy  10/11  . Diagnostic mammogram  04/2010  . Dexa scan  02/2010  . Appendectomy    . Shoulder surgery     No family history on file. Social History  Substance Use Topics  . Smoking status: Former Smoker    Quit date: 11/27/1967  . Smokeless tobacco: None  . Alcohol Use: Yes   OB History    No data available     Review of Systems  10 systems reviewed and all are negative for acute change except as noted in the HPI.  Allergies  Penicillin v potassium  Home Medications    Prior to Admission medications   Medication Sig Start Date End Date Taking? Authorizing Provider  Ascorbic Acid (VITAMIN C PO) Take by mouth daily.   Yes Historical Provider, MD  Aspirin 81 MG EC tablet Take 81 mg by mouth daily.   Yes Historical Provider, MD  CALCIUM PO Take 500 mg by mouth daily.    Yes Historical Provider, MD  celecoxib (CELEBREX) 200 MG capsule Take 200 mg by mouth daily.   Yes Historical Provider, MD  Cholecalciferol (VITAMIN D3 SUPER STRENGTH) 2000 UNITS TABS Take by mouth daily.   Yes Historical Provider, MD  Cinnamon 500 MG capsule Take 500 mg by mouth daily.   Yes Historical Provider, MD  Coenzyme Q10 (CO Q 10 PO) Take by mouth daily.   Yes Historical Provider, MD  cyclobenzaprine (FLEXERIL) 10 MG tablet Take 10 mg by mouth 3 (three) times daily as needed for muscle spasms.   Yes Historical Provider, MD  fexofenadine (ALLEGRA) 180 MG tablet Take 180 mg by mouth daily as needed for allergies.    Yes Historical Provider, MD  fluticasone (FLONASE) 50 MCG/ACT nasal spray Place 2 sprays into the nose as needed for allergies.    Yes Historical Provider, MD  Glucosamine-Chondroit-Vit C-Mn (GLUCOSAMINE CHONDR 1500 COMPLX PO) Take by mouth 2 (two) times daily.  Yes Historical Provider, MD  Magnesium Oxide 250 MG TABS Take 500 mg by mouth daily.    Yes Historical Provider, MD  montelukast (SINGULAIR) 10 MG tablet Take 10 mg by mouth at bedtime.   Yes Historical Provider, MD  Multiple Vitamins-Minerals (PRESERVISION AREDS 2 PO) Take 2 capsules by mouth daily.   Yes Historical Provider, MD  Multiple Vitamins-Minerals (ZINC PO) Take by mouth daily.   Yes Historical Provider, MD  olmesartan-hydrochlorothiazide (BENICAR HCT) 40-12.5 MG per tablet Take 1 tablet by mouth daily.   Yes Historical Provider, MD  Omega-3 Fatty Acids (FISH OIL) 1200 MG CAPS Take 1 capsule by mouth 2 (two) times daily.    Yes Historical Provider, MD  omeprazole (PRILOSEC) 20 MG capsule Take 20 mg by mouth  2 (two) times daily.   Yes Historical Provider, MD  potassium chloride (K-DUR,KLOR-CON) 10 MEQ tablet Take 10 mEq by mouth daily.    Yes Historical Provider, MD  vitamin B-12 (CYANOCOBALAMIN) 1000 MCG tablet Take 1,000 mcg by mouth daily.   Yes Historical Provider, MD  zolpidem (AMBIEN) 10 MG tablet Take 10 mg by mouth at bedtime as needed for sleep.    Yes Historical Provider, MD   BP 171/74 mmHg  Pulse 65  Temp(Src) 98.1 F (36.7 C) (Oral)  Resp 14  SpO2 100% Physical Exam  Constitutional: She is oriented to person, place, and time. She appears well-developed and well-nourished. No distress.  HENT:  Head: Normocephalic and atraumatic.  Nose: Nose normal.  Mouth/Throat: Oropharynx is clear and moist. No oropharyngeal exudate.  Eyes: Conjunctivae and EOM are normal. Pupils are equal, round, and reactive to light. No scleral icterus.  Neck: Normal range of motion. Neck supple. No JVD present. No tracheal deviation present. No thyromegaly present.  Cardiovascular: Normal rate, regular rhythm and normal heart sounds.  Exam reveals no gallop and no friction rub.   No murmur heard. Pulmonary/Chest: Effort normal and breath sounds normal. No respiratory distress. She has no wheezes. She exhibits no tenderness.  Abdominal: Soft. Bowel sounds are normal. She exhibits no distension and no mass. There is no tenderness. There is no rebound and no guarding.  Musculoskeletal: Normal range of motion. She exhibits edema (1+, bilaterally). She exhibits no tenderness.  Lymphadenopathy:    She has no cervical adenopathy.  Neurological: She is alert and oriented to person, place, and time. No cranial nerve deficit. She exhibits normal muscle tone.  Skin: Skin is warm and dry. No rash noted. No erythema. No pallor.  Nursing note and vitals reviewed.   ED Course  Procedures   DIAGNOSTIC STUDIES:  Oxygen Saturation is 100% on RA, normal by my interpretation.    COORDINATION OF CARE:  11:14 PM  Discussed treatment plan with pt at bedside and pt agreed to plan.  Labs Review Labs Reviewed  BASIC METABOLIC PANEL - Abnormal; Notable for the following:    Glucose, Bld 126 (*)    All other components within normal limits  CBC - Abnormal; Notable for the following:    WBC 3.5 (*)    Hemoglobin 15.1 (*)    All other components within normal limits  I-STAT TROPOININ, ED    Imaging Review No results found. I have personally reviewed and evaluated these images and lab results as part of my medical decision-making.   EKG Interpretation   Date/Time:  Friday December 25 2014 22:01:08 EST Ventricular Rate:  70 PR Interval:  154 QRS Duration: 96 QT Interval:  376 QTC Calculation: 406  R Axis:   82 Text Interpretation:  Normal sinus rhythm Possible Left atrial enlargement  Borderline ECG No significant change since last tracing Confirmed by Glynn Octave [phone]) on 12/25/2014 10:59:30 PM      MDM   Final diagnoses:  Shoulder pain, acute, right  Essential hypertension     Patient presents for HTN.  Her BP in the room is now 139/87 without any intervention.  She states she was walking around prior to taking her BP at home, she was advised on how to take her BP accurately.  I think pain is also contributing, will start tramadol PRN.  Troponin not clinically indicated and was ordered by triage.  She appears well and in NAD.  She appears well and in NAD.  No evidence of end organ damage.  Patient is safe for DC with close follow up with PCP.    I personally performed the services described in this documentation, which was scribed in my presence. The recorded information has been reviewed and is accurate.      [name], MD 12/25/14 2328

## 2014-12-25 NOTE — ED Notes (Signed)
Pt checked BP today at home and it was elevated.  Called pharmacy and took extra BP medication.  After extra dose BP was still elevated- pt advised to come to ED if BP not lower after 2nd dose.  Reports R shoulder pain since last night that is worse with movement.  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

## 2015-06-02 ENCOUNTER — Other Ambulatory Visit: Payer: Self-pay | Admitting: Dermatology

## 2016-06-12 ENCOUNTER — Other Ambulatory Visit: Payer: Self-pay | Admitting: Orthopedic Surgery

## 2016-06-12 DIAGNOSIS — M25512 Pain in left shoulder: Secondary | ICD-10-CM

## 2016-06-14 ENCOUNTER — Ambulatory Visit
Admission: RE | Admit: 2016-06-14 | Discharge: 2016-06-14 | Disposition: A | Discharge status: Home / Self Care | Payer: BLUE CROSS/BLUE SHIELD | Source: Ambulatory Visit | Attending: Orthopedic Surgery | Admitting: Orthopedic Surgery

## 2016-06-14 DIAGNOSIS — M25512 Pain in left shoulder: Secondary | ICD-10-CM

## 2016-06-14 LAB — SYNOVIAL CELL COUNT + DIFF, W/ CRYSTALS
BASOPHILS, %: 0 %
EOSINOPHILS-SYNOVIAL: 0 % (ref 0–2)
Lymphocytes-Synovial Fld: 29 % (ref 0–74)
MONOCYTE/MACROPHAGE: 18 % (ref 0–69)
Neutrophil, Synovial: 53 % — ABNORMAL HIGH (ref 0–24)
SYNOVIOCYTES, %: 0 % (ref 0–15)
WBC, Synovial: 56 cells/uL (ref <150)

## 2016-06-15 ENCOUNTER — Encounter: Payer: Self-pay | Admitting: Rheumatology

## 2016-06-26 LAB — BODY FLUID CULTURE
Gram Stain: NONE SEEN
Gram Stain: NONE SEEN
ORGANISM ID, BACTERIA: NO GROWTH

## 2016-06-27 LAB — ANAEROBIC CULTURE
Gram Stain: NONE SEEN
Gram Stain: NONE SEEN

## 2016-07-02 ENCOUNTER — Encounter (HOSPITAL_COMMUNITY): Payer: Self-pay | Admitting: *Deleted

## 2016-07-02 ENCOUNTER — Emergency Department (HOSPITAL_COMMUNITY)
Admission: EM | Admit: 2016-07-02 | Discharge: 2016-07-02 | Disposition: A | Discharge status: Home / Self Care | Payer: BLUE CROSS/BLUE SHIELD | Attending: Emergency Medicine | Admitting: Emergency Medicine

## 2016-07-02 DIAGNOSIS — Z87891 Personal history of nicotine dependence: Secondary | ICD-10-CM | POA: Diagnosis not present

## 2016-07-02 DIAGNOSIS — I10 Essential (primary) hypertension: Secondary | ICD-10-CM

## 2016-07-02 LAB — BASIC METABOLIC PANEL
ANION GAP: 7 (ref 5–15)
BUN: 18 mg/dL (ref 6–20)
CALCIUM: 9.6 mg/dL (ref 8.9–10.3)
CHLORIDE: 102 mmol/L (ref 101–111)
CO2: 28 mmol/L (ref 22–32)
Creatinine, Ser: 0.79 mg/dL (ref 0.44–1.00)
GFR calc non Af Amer: >60 mL/min (ref >60)
GLUCOSE: 98 mg/dL (ref 65–99)
Potassium: 4.8 mmol/L (ref 3.5–5.1)
Sodium: 137 mmol/L (ref 135–145)

## 2016-07-02 LAB — URINALYSIS, ROUTINE W REFLEX MICROSCOPIC
BILIRUBIN URINE: NEGATIVE
GLUCOSE, UA: NEGATIVE mg/dL
HGB URINE DIPSTICK: NEGATIVE
KETONES UR: NEGATIVE mg/dL
Leukocytes, UA: NEGATIVE
Nitrite: NEGATIVE
PH: 7 (ref 5.0–8.0)
Protein, ur: NEGATIVE mg/dL
SPECIFIC GRAVITY, URINE: 1.006 (ref 1.005–1.030)

## 2016-07-02 LAB — CBC WITH DIFFERENTIAL/PLATELET
Basophils Absolute: 0 10*3/uL (ref 0.0–0.1)
Basophils Relative: 0 %
Eosinophils Absolute: 0.2 10*3/uL (ref 0.0–0.7)
Eosinophils Relative: 4 %
HEMATOCRIT: 41.6 % (ref 36.0–46.0)
HEMOGLOBIN: 13.6 g/dL (ref 12.0–15.0)
LYMPHS ABS: 1.6 10*3/uL (ref 0.7–4.0)
Lymphocytes Relative: 34 %
MCH: 29.9 pg (ref 26.0–34.0)
MCHC: 32.7 g/dL (ref 30.0–36.0)
MCV: 91.4 fL (ref 78.0–100.0)
MONO ABS: 0.3 10*3/uL (ref 0.1–1.0)
Monocytes Relative: 6 %
NEUTROS ABS: 2.6 10*3/uL (ref 1.7–7.7)
NEUTROS PCT: 56 %
Platelets: 147 10*3/uL — ABNORMAL LOW (ref 150–400)
RBC: 4.55 MIL/uL (ref 3.87–5.11)
RDW: 12.9 % (ref 11.5–15.5)
WBC: 4.6 10*3/uL (ref 4.0–10.5)

## 2016-07-02 NOTE — Discharge Instructions (Signed)

## 2016-07-02 NOTE — ED Notes (Signed)
Family at bedside and patient resting.

## 2016-07-02 NOTE — ED Notes (Signed)
Patient is resting comfortably. 

## 2016-07-02 NOTE — ED Triage Notes (Signed)
Pt reports having a cyst removed from left shoulder on Thursday. Has been taking tramadol for pain. States that her bp was elevated today despite taking her bp meds. bp is 181/87 at triage.

## 2016-07-02 NOTE — ED Provider Notes (Signed)
Emergency Department Provider Note   I have reviewed the triage vital signs and the nursing notes.   HISTORY  Chief Complaint Hypertension   HPI Brittney Stewart is a 76 y.o. female with PMH of HTN and GERD presents to the emergency department for evaluation of elevated blood pressures. Patient had a cyst removed on her left shoulder last week and since that time has had elevated blood pressures at home. She denies any changes to her blood pressure medication. She's been compliant with this medication. No chest pain or difficulty breathing. She initially had significant discomfort with her cyst removal but took tramadol and improved. She describes her pain currently is mild. No blurry vision, numbness, weakness.    Past Medical History:  Diagnosis Date  . Allergic rhinitis, cause unspecified   . Benign heart murmur   . GERD (gastroesophageal reflux disease)   . Hypertension   . Inflamed sebaceous cyst   . Insomnia, unspecified   . Urine incontinence   . Vitamin D deficiency     Patient Active Problem List   Diagnosis Date Noted  . Inflamed sebaceous cyst 12/04/2011  . Hypertension 12/04/2011  . GERD (gastroesophageal reflux disease) 12/04/2011  . Allergic rhinitis 12/04/2011  . Hyperlipidemia 12/04/2011    Past Surgical History:  Procedure Laterality Date  . APPENDECTOMY    . COLONOSCOPY  10/11  . dexa scan  02/2010  . DIAGNOSTIC MAMMOGRAM  04/2010  . SHOULDER SURGERY      Current Outpatient Rx  . Order #: [id number] Class: Historical Med  . Order #: [id number] Class: Historical Med  . Order #: [id number] Class: Historical Med  . Order #: [id number] Class: Historical Med  . Order #: [id number] Class: Historical Med  . Order #: [id number] Class: Historical Med  . Order #: [id number] Class: Historical Med  . Order #: [id number] Class: Historical Med  . Order #: [id number] Class: Historical Med  . Order #: [id number] Class: Historical Med  . Order #: [id number] Class: Historical Med  . Order  #: [id number] Class: Historical Med  . Order #: [id number] Class: Historical Med  . Order #: [id number] Class: Historical Med  . Order #: [id number] Class: Print  . Order #: [id number] Class: Historical Med  . Order #: [id number] Class: Historical Med    Allergies Penicillin v potassium  History reviewed. No pertinent family history.  Social History Social History  Substance Use Topics  . Smoking status: Former Smoker    Quit date: 11/27/1967  . Smokeless tobacco: Not on file  . Alcohol use Yes    Review of Systems  Constitutional: No fever/chills Eyes: No visual changes. ENT: No sore throat. Cardiovascular: Denies chest pain. Positive HTN.  Respiratory: Denies shortness of breath. Gastrointestinal: No abdominal pain.  No nausea, no vomiting.  No diarrhea.  No constipation. Genitourinary: Negative for dysuria. Musculoskeletal: Negative for back pain. Skin: Negative for rash. Mild pain over cyst removal site.  Neurological: Negative for headaches, focal weakness or numbness.  10-point ROS otherwise negative.  ____________________________________________   PHYSICAL EXAM:  VITAL SIGNS: ED Triage Vitals  Enc Vitals Group     BP 07/02/16 0921 (!) 181/87     Pulse Rate 07/02/16 0921 (!) 55     Resp 07/02/16 0921 18     Temp 07/02/16 0921 98.1 F (36.7 C)     Temp Source 07/02/16 0921 Oral     SpO2 07/02/16 0921 98 %     Pain Score 07/02/16 0919 0   Constitutional: Alert and oriented. Well appearing and in no acute distress. Eyes: Conjunctivae are normal.  Head: Atraumatic. Nose: No congestion/rhinnorhea. Mouth/Throat: Mucous membranes are moist.  Neck: No stridor.   Cardiovascular: Normal rate, regular rhythm. Good peripheral circulation. Grossly normal heart sounds.   Respiratory: Normal respiratory effort.  No retractions. Lungs CTAB. Gastrointestinal: Soft and nontender. No distention.  Musculoskeletal: No lower extremity tenderness nor edema. No gross deformities of  extremities. Neurologic:  Normal speech and language. No gross focal neurologic deficits are appreciated.  Skin:  Skin is warm and dry. Clean and dry dressing over left shoulder with no surrounding erythema or induration/fluctuance.    ____________________________________________   LABS (all labs ordered are listed, but only abnormal results are displayed)  Labs Reviewed  CBC WITH DIFFERENTIAL/PLATELET - Abnormal; Notable for the following:       Result Value   Platelets 147 (*)    All other components within normal limits  URINALYSIS, ROUTINE W REFLEX MICROSCOPIC - Abnormal; Notable for the following:    Color, Urine STRAW (*)    All other components within normal limits  BASIC METABOLIC PANEL   ____________________________________________  RADIOLOGY  None ____________________________________________   PROCEDURES  Procedure(s) performed:   Procedures  None ____________________________________________   INITIAL IMPRESSION / ASSESSMENT AND PLAN / ED COURSE  Pertinent labs & imaging results that were available during my care of the patient were reviewed by me and considered in my medical decision making (see chart for details).  Patient resents to the emergency room in for evaluation of asymptomatic elevated blood pressure in the setting of recent outpatient cyst removal. No significant pain currently. The incision is well-appearing with no erythema or drainage. She has no exam findings consistent with stroke. No historical details consistent with hypertension emergency.   Labs are unremarkable. Advised continue use of Tylenol for pain control with Tramadol for breakthrough pain. Will continue HTN medication and follow with PCP regarding any changes. No changes made in the ED.   At this time, I do not feel there is any life-threatening condition present. I have reviewed and discussed all results (EKG, imaging, lab, urine as appropriate), exam findings with patient. I have  reviewed nursing notes and appropriate previous records.  I feel the patient is safe to be discharged home without further emergent workup. Discussed usual and customary return precautions. Patient and family (if present) verbalize understanding and are comfortable with this plan.  Patient will follow-up with their primary care provider. If they do not have a primary care provider, information for follow-up has been provided to them. All questions have been answered.  ____________________________________________  FINAL CLINICAL IMPRESSION(S) / ED DIAGNOSES  Final diagnoses:  Essential hypertension     MEDICATIONS GIVEN DURING THIS VISIT:  None  NEW OUTPATIENT MEDICATIONS STARTED DURING THIS VISIT:  None   Note:  This document was prepared using Dragon voice recognition software and may include unintentional dictation errors.  Nanda Quinton, MD Emergency Medicine   Jhon Mallozzi, [facility], MD 07/03/16 [phone]

## 2016-07-10 ENCOUNTER — Other Ambulatory Visit: Payer: Self-pay | Admitting: Cardiology

## 2016-07-10 DIAGNOSIS — R079 Chest pain, unspecified: Secondary | ICD-10-CM

## 2016-07-14 ENCOUNTER — Encounter (HOSPITAL_COMMUNITY)
Admission: RE | Admit: 2016-07-14 | Discharge: 2016-07-14 | Disposition: A | Discharge status: Home / Self Care | Payer: BLUE CROSS/BLUE SHIELD | Source: Ambulatory Visit | Attending: Cardiology | Admitting: Cardiology

## 2016-07-14 DIAGNOSIS — R079 Chest pain, unspecified: Secondary | ICD-10-CM | POA: Diagnosis present

## 2016-07-14 MED ORDER — REGADENOSON 0.4 MG/5ML IV SOLN
INTRAVENOUS | Status: AC
  Administered 2016-07-14: 0.4 mg
  Filled 2016-07-14: qty 5

## 2016-07-14 MED ORDER — TECHNETIUM TC 99M TETROFOSMIN IV KIT
10.0000 | PACK | Freq: Once | INTRAVENOUS | Status: AC | PRN
  Administered 2016-07-14: 10 via INTRAVENOUS

## 2016-07-14 MED ORDER — TECHNETIUM TC 99M TETROFOSMIN IV KIT
30.0000 | PACK | Freq: Once | INTRAVENOUS | Status: AC | PRN
  Administered 2016-07-14: 30 via INTRAVENOUS

## 2016-07-14 NOTE — Progress Notes (Signed)
Pre Lexiscan VS

## 2016-07-14 NOTE — Progress Notes (Signed)
Post lexiscan

## 2016-09-05 ENCOUNTER — Other Ambulatory Visit: Payer: Self-pay | Admitting: Dermatology

## 2017-01-04 DIAGNOSIS — Z86010 Personal history of colonic polyps: Secondary | ICD-10-CM | POA: Diagnosis not present

## 2017-01-04 DIAGNOSIS — R0789 Other chest pain: Secondary | ICD-10-CM | POA: Diagnosis not present

## 2017-01-04 DIAGNOSIS — K219 Gastro-esophageal reflux disease without esophagitis: Secondary | ICD-10-CM | POA: Diagnosis not present

## 2017-01-17 DIAGNOSIS — E782 Mixed hyperlipidemia: Secondary | ICD-10-CM | POA: Diagnosis not present

## 2017-01-17 DIAGNOSIS — I10 Essential (primary) hypertension: Secondary | ICD-10-CM | POA: Diagnosis not present

## 2017-01-17 DIAGNOSIS — J302 Other seasonal allergic rhinitis: Secondary | ICD-10-CM | POA: Diagnosis not present

## 2017-02-05 DIAGNOSIS — I10 Essential (primary) hypertension: Secondary | ICD-10-CM | POA: Diagnosis not present

## 2017-02-05 DIAGNOSIS — R7309 Other abnormal glucose: Secondary | ICD-10-CM | POA: Diagnosis not present

## 2017-02-05 DIAGNOSIS — M1990 Unspecified osteoarthritis, unspecified site: Secondary | ICD-10-CM | POA: Diagnosis not present

## 2017-02-05 DIAGNOSIS — K219 Gastro-esophageal reflux disease without esophagitis: Secondary | ICD-10-CM | POA: Diagnosis not present

## 2017-02-05 DIAGNOSIS — R0789 Other chest pain: Secondary | ICD-10-CM | POA: Diagnosis not present

## 2017-02-05 DIAGNOSIS — E785 Hyperlipidemia, unspecified: Secondary | ICD-10-CM | POA: Diagnosis not present

## 2017-02-14 ENCOUNTER — Other Ambulatory Visit: Payer: Self-pay | Admitting: Dermatology

## 2017-02-14 DIAGNOSIS — D485 Neoplasm of uncertain behavior of skin: Secondary | ICD-10-CM | POA: Diagnosis not present

## 2017-02-14 DIAGNOSIS — D229 Melanocytic nevi, unspecified: Secondary | ICD-10-CM | POA: Diagnosis not present

## 2017-02-14 DIAGNOSIS — D2239 Melanocytic nevi of other parts of face: Secondary | ICD-10-CM | POA: Diagnosis not present

## 2017-02-14 DIAGNOSIS — L720 Epidermal cyst: Secondary | ICD-10-CM | POA: Diagnosis not present

## 2017-03-28 DIAGNOSIS — J00 Acute nasopharyngitis [common cold]: Secondary | ICD-10-CM | POA: Diagnosis not present

## 2017-03-28 DIAGNOSIS — R070 Pain in throat: Secondary | ICD-10-CM | POA: Diagnosis not present

## 2017-03-28 DIAGNOSIS — E782 Mixed hyperlipidemia: Secondary | ICD-10-CM | POA: Diagnosis not present

## 2017-05-14 DIAGNOSIS — I10 Essential (primary) hypertension: Secondary | ICD-10-CM | POA: Diagnosis not present

## 2017-05-14 DIAGNOSIS — F5101 Primary insomnia: Secondary | ICD-10-CM | POA: Diagnosis not present

## 2017-05-14 DIAGNOSIS — E782 Mixed hyperlipidemia: Secondary | ICD-10-CM | POA: Diagnosis not present

## 2017-05-15 ENCOUNTER — Encounter: Payer: Self-pay | Admitting: Rheumatology

## 2017-06-04 DIAGNOSIS — K219 Gastro-esophageal reflux disease without esophagitis: Secondary | ICD-10-CM | POA: Diagnosis not present

## 2017-06-04 DIAGNOSIS — M1990 Unspecified osteoarthritis, unspecified site: Secondary | ICD-10-CM | POA: Diagnosis not present

## 2017-06-04 DIAGNOSIS — R7309 Other abnormal glucose: Secondary | ICD-10-CM | POA: Diagnosis not present

## 2017-06-04 DIAGNOSIS — I10 Essential (primary) hypertension: Secondary | ICD-10-CM | POA: Diagnosis not present

## 2017-06-04 DIAGNOSIS — E785 Hyperlipidemia, unspecified: Secondary | ICD-10-CM | POA: Diagnosis not present

## 2017-06-06 DIAGNOSIS — Z961 Presence of intraocular lens: Secondary | ICD-10-CM | POA: Diagnosis not present

## 2017-06-06 DIAGNOSIS — H353131 Nonexudative age-related macular degeneration, bilateral, early dry stage: Secondary | ICD-10-CM | POA: Diagnosis not present

## 2017-06-20 DIAGNOSIS — J00 Acute nasopharyngitis [common cold]: Secondary | ICD-10-CM | POA: Diagnosis not present

## 2017-06-20 DIAGNOSIS — J019 Acute sinusitis, unspecified: Secondary | ICD-10-CM | POA: Diagnosis not present

## 2017-06-20 DIAGNOSIS — E782 Mixed hyperlipidemia: Secondary | ICD-10-CM | POA: Diagnosis not present

## 2017-08-13 DIAGNOSIS — I10 Essential (primary) hypertension: Secondary | ICD-10-CM | POA: Diagnosis not present

## 2017-08-13 DIAGNOSIS — M1990 Unspecified osteoarthritis, unspecified site: Secondary | ICD-10-CM | POA: Diagnosis not present

## 2017-08-13 DIAGNOSIS — E559 Vitamin D deficiency, unspecified: Secondary | ICD-10-CM | POA: Diagnosis not present

## 2017-08-13 DIAGNOSIS — Z23 Encounter for immunization: Secondary | ICD-10-CM | POA: Diagnosis not present

## 2017-08-13 DIAGNOSIS — E782 Mixed hyperlipidemia: Secondary | ICD-10-CM | POA: Diagnosis not present

## 2017-09-18 DIAGNOSIS — E559 Vitamin D deficiency, unspecified: Secondary | ICD-10-CM | POA: Diagnosis not present

## 2017-09-18 DIAGNOSIS — E782 Mixed hyperlipidemia: Secondary | ICD-10-CM | POA: Diagnosis not present

## 2017-09-21 ENCOUNTER — Encounter: Payer: Self-pay | Admitting: Rheumatology

## 2017-09-21 ENCOUNTER — Ambulatory Visit (INDEPENDENT_AMBULATORY_CARE_PROVIDER_SITE_OTHER): Payer: Medicare Other | Admitting: Rheumatology

## 2017-09-21 ENCOUNTER — Ambulatory Visit (INDEPENDENT_AMBULATORY_CARE_PROVIDER_SITE_OTHER): Payer: Medicare Other

## 2017-09-21 ENCOUNTER — Ambulatory Visit (INDEPENDENT_AMBULATORY_CARE_PROVIDER_SITE_OTHER): Payer: Self-pay

## 2017-09-21 VITALS — BP 128/80 | HR 70 | Resp 14 | Ht 67.0 in | Wt 179.0 lb

## 2017-09-21 DIAGNOSIS — M5030 Other cervical disc degeneration, unspecified cervical region: Secondary | ICD-10-CM

## 2017-09-21 DIAGNOSIS — Z8639 Personal history of other endocrine, nutritional and metabolic disease: Secondary | ICD-10-CM

## 2017-09-21 DIAGNOSIS — Z8719 Personal history of other diseases of the digestive system: Secondary | ICD-10-CM | POA: Diagnosis not present

## 2017-09-21 DIAGNOSIS — G8929 Other chronic pain: Secondary | ICD-10-CM

## 2017-09-21 DIAGNOSIS — M25562 Pain in left knee: Secondary | ICD-10-CM

## 2017-09-21 DIAGNOSIS — M19041 Primary osteoarthritis, right hand: Secondary | ICD-10-CM | POA: Diagnosis not present

## 2017-09-21 DIAGNOSIS — Z889 Allergy status to unspecified drugs, medicaments and biological substances status: Secondary | ICD-10-CM | POA: Diagnosis not present

## 2017-09-21 DIAGNOSIS — M51369 Other intervertebral disc degeneration, lumbar region without mention of lumbar back pain or lower extremity pain: Secondary | ICD-10-CM | POA: Insufficient documentation

## 2017-09-21 DIAGNOSIS — M720 Palmar fascial fibromatosis [Dupuytren]: Secondary | ICD-10-CM

## 2017-09-21 DIAGNOSIS — Z96611 Presence of right artificial shoulder joint: Secondary | ICD-10-CM

## 2017-09-21 DIAGNOSIS — M19042 Primary osteoarthritis, left hand: Secondary | ICD-10-CM

## 2017-09-21 DIAGNOSIS — M2141 Flat foot [pes planus] (acquired), right foot: Secondary | ICD-10-CM

## 2017-09-21 DIAGNOSIS — Z96612 Presence of left artificial shoulder joint: Secondary | ICD-10-CM

## 2017-09-21 DIAGNOSIS — M5136 Other intervertebral disc degeneration, lumbar region: Secondary | ICD-10-CM | POA: Insufficient documentation

## 2017-09-21 DIAGNOSIS — Z8679 Personal history of other diseases of the circulatory system: Secondary | ICD-10-CM

## 2017-09-21 DIAGNOSIS — M25561 Pain in right knee: Secondary | ICD-10-CM

## 2017-09-21 DIAGNOSIS — M2142 Flat foot [pes planus] (acquired), left foot: Secondary | ICD-10-CM

## 2017-09-21 MED ORDER — TRIAMCINOLONE ACETONIDE 40 MG/ML IJ SUSP
40.0000 mg | INTRAMUSCULAR | Status: AC | PRN
  Administered 2017-09-21: 40 mg via INTRA_ARTICULAR

## 2017-09-21 MED ORDER — LIDOCAINE HCL 1 % IJ SOLN
1.5000 mL | INTRAMUSCULAR | Status: AC | PRN
  Administered 2017-09-21: 1.5 mL

## 2017-09-21 MED ORDER — DICLOFENAC SODIUM 1 % TD GEL: TRANSDERMAL | 3 refills | Status: DC

## 2017-09-21 NOTE — Patient Instructions (Signed)

## 2017-09-21 NOTE — Progress Notes (Addendum)
Office Visit Note  Patient: Brittney Stewart             Date of Birth: [birth year]           MRN: [id number]             PCP: Audley Hose, MD Referring: Juanita Craver, MD Visit Date: 09/21/2017 Occupation: @GUAROCC @  Subjective:  Pain in both knees.   History of Present Illness: Brittney Stewart is a 77 y.o. female self-referred herself for evaluation of her symptoms.  According to patient she has had pain in her bilateral knee joints for at least last 12 years.  She notices increased pain and stiffness after prolonged sitting.  She has been also having difficulty climbing the stairs.  She has never had cortisone injections to her knees.  She states Dr. Mardelle Matte did x-ray of her bilateral knee joints about 2 years ago but did not recommend any surgery.  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  She also had left partial shoulder replacement 17 years ago by Dr. French Ana.  She complains of pain in her bilateral hands especially in her [facility] joints for which she is seeing Dr. Amedeo Plenty.  She was also told that she has right fourth Dupuytren's contracture and no surgery was offered.  She occasionally had discomfort in her feet especially when she is wearing improper shoes.  She has nocturnal pain in her feet at times.  She denies any history of joint swelling.  There is no family history of autoimmune disease.  Activities of Daily Living:  Patient reports morning stiffness for 10 minutes.   Patient Reports nocturnal pain.  Difficulty dressing/grooming: Denies Difficulty climbing stairs: Reports Difficulty getting out of chair: Reports Difficulty using hands for taps, buttons, cutlery, and/or writing: Denies  Review of Systems  Constitutional: Negative for fatigue, night sweats, weight gain and weight loss.  HENT: Negative for mouth sores, trouble swallowing, trouble swallowing, mouth dryness and nose dryness.   Eyes: Negative for pain, redness, visual  disturbance and dryness.  Respiratory: Negative for cough, shortness of breath and difficulty breathing.   Cardiovascular: Negative for chest pain, palpitations, hypertension, irregular heartbeat and swelling in legs/feet.  Gastrointestinal: Negative for blood in stool, constipation and diarrhea.  Endocrine: Negative for increased urination.  Genitourinary: Negative for vaginal dryness.  Musculoskeletal: Positive for arthralgias, joint pain and morning stiffness. Negative for joint swelling, myalgias, muscle weakness, muscle tenderness and myalgias.  Skin: Negative for color change, rash, hair loss, skin tightness, ulcers and sensitivity to sunlight.  Allergic/Immunologic: Negative for susceptible to infections.  Neurological: Negative for dizziness, memory loss, night sweats and weakness.  Hematological: Negative for swollen glands.  Psychiatric/Behavioral: Negative for depressed mood and sleep disturbance. The patient is not nervous/anxious.     PMFS History:  Patient Active Problem List   Diagnosis Date Noted  . DDD (degenerative disc disease), cervical 09/21/2017  . DDD (degenerative disc disease), lumbar 09/21/2017  . Inflamed sebaceous cyst 12/04/2011  . Hypertension 12/04/2011  . GERD (gastroesophageal reflux disease) 12/04/2011  . Allergic rhinitis 12/04/2011  . Hyperlipidemia 12/04/2011    Past Medical History:  Diagnosis Date  . Allergic rhinitis, cause unspecified   . Benign heart murmur   . GERD (gastroesophageal reflux disease)   . Hypertension   . Inflamed sebaceous cyst   . Insomnia, unspecified   . Urine incontinence   . Vitamin D deficiency     Family History  Problem Relation Age of Onset  .  Lung cancer Mother   . Arthritis Mother   . Hypertension Mother   . Hypertension Father   . Stroke Father   . Diabetes Father   . Diabetes Sister   . Hypertension Sister   . Hypertension Son    Past Surgical History:  Procedure Laterality Date  . APPENDECTOMY      . BACK SURGERY    . CERVICAL SPINE SURGERY    . COLONOSCOPY  10/11  . dexa scan  02/2010  . DIAGNOSTIC MAMMOGRAM  04/2010  . PELVIC FRACTURE SURGERY  1968  . SHOULDER SURGERY     complete right, partial left  . TUMOR REMOVAL     Social History   Social History Narrative  . Not on file    Objective: Vital Signs: BP 128/80 (BP Location: Right Arm, Patient Position: Sitting, Cuff Size: Normal)   Pulse 70   Resp 14   Ht 5\' 7"  (1.702 m)   Wt 179 lb (81.2 kg)   BMI 28.04 kg/m    Physical Exam  Constitutional: She is oriented to person, place, and time. She appears well-developed and well-nourished.  HENT:  Head: Normocephalic and atraumatic.  Eyes: Conjunctivae and EOM are normal.  Neck: Normal range of motion.  Cardiovascular: Normal rate, regular rhythm, normal heart sounds and intact distal pulses.  Pulmonary/Chest: Effort normal and breath sounds normal.  Abdominal: Soft. Bowel sounds are normal.  Lymphadenopathy:    She has no cervical adenopathy.  Neurological: She is alert and oriented to person, place, and time.  Skin: Skin is warm and dry. Capillary refill takes less than 2 seconds.  Psychiatric: She has a normal mood and affect. Her behavior is normal.  Nursing note and vitals reviewed.    Musculoskeletal Exam: C-spine limited range of motion.  Lumbar spine good range of motion with's postsurgical scar.  Right shoulder joint which is totally replaced at good range of motion.  Left shoulder joint has some limitation of range of motion and discomfort which is partially replaced.  Elbow joints were in good range of motion.  She had bilateral CMC arthritis more prominent in her right hand.  She has right fourth Dupuytren's contracture.  Hip joints were in good range of motion.  She has some warmth on palpation of her right knee joint.  She had discomfort range of motion of bilateral knee joints.  Ankle joints were in good range of motion.  She has pes planus bilaterally.  No  warmth swelling or inflammation was noted in her feet.  CDAI Exam: CDAI Score: Not documented Patient Global Assessment: Not documented; Provider Global Assessment: Not documented Swollen: Not documented; Tender: Not documented Joint Exam   Not documented   There is currently no information documented on the homunculus. Go to the Rheumatology activity and complete the homunculus joint exam.  Investigation: Findings:  December 18, 2017 CBC normal, CMP normal, lipid panel triglycerides 194, LDL 70, vitamin D 29.7 low, magnesium 1.8 normal   Imaging: Xr Knee 3 View Left  Result Date: 09/21/2017 Mild medial compartment narrowing was noted.  No chondrocalcinosis was noted.  Intercondylar osteophytes were noted.  Moderate patellofemoral narrowing was noted. Impression: These findings are consistent with mild osteoarthritis and moderate chondromalacia patella.  Xr Knee 3 View Right  Result Date: 09/21/2017 Moderate medial compartment narrowing was noted.  Intercondylar osteophytes was noted.  Moderate patellofemoral narrowing was noted. Impression: These findings are consistent with moderate osteoarthritis and moderate chondromalacia patella.   Recent Labs: Lab Results  Component Value Date   WBC 4.6 07/02/2016   HGB 13.6 07/02/2016   PLT 147 (L) 07/02/2016   NA 137 07/02/2016   K 4.8 07/02/2016   CL 102 07/02/2016   CO2 28 07/02/2016   GLUCOSE 98 07/02/2016   BUN 18 07/02/2016   CREATININE 0.79 07/02/2016   BILITOT 1.2 02/03/2008   ALKPHOS 69 02/03/2008   AST 23 02/03/2008   ALT 15 02/03/2008   PROT 7.3 02/03/2008   ALBUMIN 3.7 02/03/2008   CALCIUM 9.6 07/02/2016   GFRAA >60 07/02/2016    Speciality Comments: No specialty comments available.  Procedures:  Large Joint Inj: R knee on 09/21/2017 9:05 AM Indications: pain Details: 27 G 1.5 in needle, medial approach  Arthrogram: No  Medications: 40 mg triamcinolone acetonide 40 MG/ML; 1.5 mL lidocaine 1 % Aspirate: 0  mL Outcome: tolerated well, no immediate complications Procedure, treatment alternatives, risks and benefits explained, specific risks discussed. Consent was given by the patient. Immediately prior to procedure a time out was called to verify the correct patient, procedure, equipment, support staff and site/side marked as required. Patient was prepped and draped in the usual sterile fashion.     Allergies: Penicillin v potassium   Assessment / Plan:     Visit Diagnoses: Chronic pain of both knees -patient has been experiencing pain in her bilateral knee joints more so on her right knee.  She has some warmth on palpation of her right knee today.  Plan: XR KNEE 3 VIEW RIGHT, XR KNEE 3 VIEW LEFT.  The x-rays were consistent with mild to moderate osteoarthritis and moderate chondromalacia patella.  A handout on knee joint exercises was given.  A prescription for diclofenac gel was given.  Side effects were reviewed.  A list of natural anti-inflammatories was given.  Weight loss diet and exercise was encouraged.  H/O total shoulder replacement, right - 2017 by Dr. Mardelle Matte.  Doing well she has good range of motion.  History of left shoulder replacement partial 2003 by Dr. French Ana.  She states she has some discomfort.  She has some limitation of range of motion.  She would like to have revision in future.  Primary osteoarthritis of both hands-arthritis is mostly in her CMC joints.  She has right CMC discomfort.  She is a Therapist, nutritional and plays multiple instruments.  I have given her prescription for right CMC brace.  Dupuytren's contracture of right hand-currently she has good range of motion without any interference.  Pes planus of both feet-she has chronic discomfort in her feet without any synovitis.  DDD (degenerative disc disease), cervical - Status post fusion.  She has fairly good range of motion.  DDD (degenerative disc disease), lumbar - Status post discectomy she had good range of motion without  much discomfort.  History of hypertension-her blood pressure is well controlled.   History of hyperlipidemia-triglycerides are still elevated.  History of gastroesophageal reflux (GERD)  History of seasonal allergies   Orders: Orders Placed This Encounter  Procedures  . Large Joint Inj  . XR KNEE 3 VIEW RIGHT  . XR KNEE 3 VIEW LEFT  . Rheumatoid factor  . Sedimentation rate  . Cyclic citrul peptide antibody, IgG  . Uric acid   Meds ordered this encounter  Medications  . diclofenac sodium (VOLTAREN) 1 % GEL    Sig: 3 grams to 3 large joints up to 3 times daily    Dispense:  3 Tube    Refill:  3    Face-to-face  time spent with patient was 50 minutes. Greater than 50% of time was spent in counseling and coordination of care.  Follow-Up Instructions: Return in about 6 months (around 03/22/2018) for Osteoarthritis.   Bo Merino, MD  Note - This record has been created using Editor, commissioning.  Chart creation errors have been sought, but may not always  have been located. Such creation errors do not reflect on  the standard of medical care.

## 2017-09-24 ENCOUNTER — Telehealth: Payer: Self-pay | Admitting: Rheumatology

## 2017-09-24 LAB — URIC ACID: Uric Acid, Serum: 5.7 mg/dL (ref 2.5–7.0)

## 2017-09-24 LAB — SEDIMENTATION RATE: Sed Rate: 17 mm/h (ref 0–30)

## 2017-09-24 LAB — CYCLIC CITRUL PEPTIDE ANTIBODY, IGG

## 2017-09-24 LAB — RHEUMATOID FACTOR: Rhuematoid fact SerPl-aCnc: 57 IU/mL — ABNORMAL HIGH (ref <14)

## 2017-09-24 NOTE — Telephone Encounter (Signed)
Patient advised RF is elevated. Patient advised to notify the office if she develops any swelling. Patient advised we will monitor for now. Patient verbalized understanding.

## 2017-09-24 NOTE — Telephone Encounter (Signed)
Patient called stating she was returning a call to our office regarding her labwork.

## 2017-09-25 NOTE — Progress Notes (Signed)
I discussed lab results with patient.  At this point she does not have any joint swelling.  She had good response to cortisone injection.  If she develops any new symptoms or joint swelling she will notify me.

## 2017-10-03 ENCOUNTER — Telehealth: Payer: Self-pay | Admitting: Rheumatology

## 2017-10-03 NOTE — Telephone Encounter (Signed)
Patient advised a prior authorization has not been received from the pharmacy. Advised patient I have completed the prior authorization once I received her message today. Submitted via cover my meds. Prior Authorization has been approved and patient advised. Patient will contact pharmacy.

## 2017-10-03 NOTE — Telephone Encounter (Signed)
Patient called stating the pharmacist at [facility] in [place] told her she needs a prior authorization before they will be able to fill the prescription of Diclofenac Sodium 1% Gel.

## 2017-10-08 DIAGNOSIS — I10 Essential (primary) hypertension: Secondary | ICD-10-CM | POA: Diagnosis not present

## 2017-10-08 DIAGNOSIS — K219 Gastro-esophageal reflux disease without esophagitis: Secondary | ICD-10-CM | POA: Diagnosis not present

## 2017-10-08 DIAGNOSIS — E559 Vitamin D deficiency, unspecified: Secondary | ICD-10-CM | POA: Diagnosis not present

## 2017-10-08 DIAGNOSIS — M1990 Unspecified osteoarthritis, unspecified site: Secondary | ICD-10-CM | POA: Diagnosis not present

## 2017-10-08 DIAGNOSIS — R7303 Prediabetes: Secondary | ICD-10-CM | POA: Diagnosis not present

## 2017-10-08 DIAGNOSIS — E785 Hyperlipidemia, unspecified: Secondary | ICD-10-CM | POA: Diagnosis not present

## 2017-10-30 DIAGNOSIS — R5381 Other malaise: Secondary | ICD-10-CM | POA: Diagnosis not present

## 2017-10-30 DIAGNOSIS — E782 Mixed hyperlipidemia: Secondary | ICD-10-CM | POA: Diagnosis not present

## 2017-10-30 DIAGNOSIS — I10 Essential (primary) hypertension: Secondary | ICD-10-CM | POA: Diagnosis not present

## 2017-10-30 DIAGNOSIS — B349 Viral infection, unspecified: Secondary | ICD-10-CM | POA: Diagnosis not present

## 2017-12-03 DIAGNOSIS — Z1239 Encounter for other screening for malignant neoplasm of breast: Secondary | ICD-10-CM | POA: Diagnosis not present

## 2017-12-03 DIAGNOSIS — E782 Mixed hyperlipidemia: Secondary | ICD-10-CM | POA: Diagnosis not present

## 2017-12-03 DIAGNOSIS — Z1211 Encounter for screening for malignant neoplasm of colon: Secondary | ICD-10-CM | POA: Diagnosis not present

## 2017-12-03 DIAGNOSIS — M1990 Unspecified osteoarthritis, unspecified site: Secondary | ICD-10-CM | POA: Diagnosis not present

## 2017-12-03 DIAGNOSIS — Z0001 Encounter for general adult medical examination with abnormal findings: Secondary | ICD-10-CM | POA: Diagnosis not present

## 2017-12-03 DIAGNOSIS — Z13820 Encounter for screening for osteoporosis: Secondary | ICD-10-CM | POA: Diagnosis not present

## 2017-12-03 DIAGNOSIS — Z23 Encounter for immunization: Secondary | ICD-10-CM | POA: Diagnosis not present

## 2017-12-03 DIAGNOSIS — J309 Allergic rhinitis, unspecified: Secondary | ICD-10-CM | POA: Diagnosis not present

## 2017-12-03 DIAGNOSIS — I10 Essential (primary) hypertension: Secondary | ICD-10-CM | POA: Diagnosis not present

## 2017-12-28 DIAGNOSIS — Z23 Encounter for immunization: Secondary | ICD-10-CM | POA: Diagnosis not present

## 2018-01-11 DIAGNOSIS — E782 Mixed hyperlipidemia: Secondary | ICD-10-CM | POA: Diagnosis not present

## 2018-01-11 DIAGNOSIS — I10 Essential (primary) hypertension: Secondary | ICD-10-CM | POA: Diagnosis not present

## 2018-01-11 DIAGNOSIS — E559 Vitamin D deficiency, unspecified: Secondary | ICD-10-CM | POA: Diagnosis not present

## 2018-01-14 DIAGNOSIS — N3281 Overactive bladder: Secondary | ICD-10-CM | POA: Diagnosis not present

## 2018-01-14 DIAGNOSIS — Z6827 Body mass index (BMI) 27.0-27.9, adult: Secondary | ICD-10-CM | POA: Diagnosis not present

## 2018-01-14 DIAGNOSIS — Z01419 Encounter for gynecological examination (general) (routine) without abnormal findings: Secondary | ICD-10-CM | POA: Diagnosis not present

## 2018-01-14 DIAGNOSIS — Z1231 Encounter for screening mammogram for malignant neoplasm of breast: Secondary | ICD-10-CM | POA: Diagnosis not present

## 2018-02-04 DIAGNOSIS — E782 Mixed hyperlipidemia: Secondary | ICD-10-CM | POA: Diagnosis not present

## 2018-02-04 DIAGNOSIS — H00019 Hordeolum externum unspecified eye, unspecified eyelid: Secondary | ICD-10-CM | POA: Diagnosis not present

## 2018-02-11 DIAGNOSIS — K219 Gastro-esophageal reflux disease without esophagitis: Secondary | ICD-10-CM | POA: Diagnosis not present

## 2018-02-11 DIAGNOSIS — I10 Essential (primary) hypertension: Secondary | ICD-10-CM | POA: Diagnosis not present

## 2018-02-11 DIAGNOSIS — R7303 Prediabetes: Secondary | ICD-10-CM | POA: Diagnosis not present

## 2018-02-11 DIAGNOSIS — M1990 Unspecified osteoarthritis, unspecified site: Secondary | ICD-10-CM | POA: Diagnosis not present

## 2018-02-11 DIAGNOSIS — E559 Vitamin D deficiency, unspecified: Secondary | ICD-10-CM | POA: Diagnosis not present

## 2018-02-11 DIAGNOSIS — E785 Hyperlipidemia, unspecified: Secondary | ICD-10-CM | POA: Diagnosis not present

## 2018-02-27 DIAGNOSIS — M791 Myalgia, unspecified site: Secondary | ICD-10-CM | POA: Diagnosis not present

## 2018-03-06 ENCOUNTER — Ambulatory Visit
Admission: RE | Admit: 2018-03-06 | Discharge: 2018-03-06 | Disposition: A | Discharge status: Home / Self Care | Payer: Medicare Other | Source: Ambulatory Visit | Attending: Internal Medicine | Admitting: Internal Medicine

## 2018-03-06 ENCOUNTER — Other Ambulatory Visit: Payer: Self-pay | Admitting: Internal Medicine

## 2018-03-06 DIAGNOSIS — M545 Low back pain, unspecified: Secondary | ICD-10-CM

## 2018-03-06 DIAGNOSIS — S3992XA Unspecified injury of lower back, initial encounter: Secondary | ICD-10-CM | POA: Diagnosis not present

## 2018-03-08 NOTE — Progress Notes (Deleted)
Office Visit Note  Patient: Brittney Stewart             Date of Birth: [birth year]           MRN: [id number]             PCP: Audley Hose, MD Referring: Audley Hose, MD Visit Date: 03/22/2018 Occupation: @GUAROCC @  Subjective:  No chief complaint on file.   History of Present Illness: Brittney Stewart is a 78 y.o. female ***   Activities of Daily Living:  Patient reports morning stiffness for *** {minute/hour:19697}.   Patient {ACTIONS;DENIES/REPORTS:21021675::"Denies"} nocturnal pain.  Difficulty dressing/grooming: {ACTIONS;DENIES/REPORTS:21021675::"Denies"} Difficulty climbing stairs: {ACTIONS;DENIES/REPORTS:21021675::"Denies"} Difficulty getting out of chair: {ACTIONS;DENIES/REPORTS:21021675::"Denies"} Difficulty using hands for taps, buttons, cutlery, and/or writing: {ACTIONS;DENIES/REPORTS:21021675::"Denies"}  No Rheumatology ROS completed.   PMFS History:  Patient Active Problem List   Diagnosis Date Noted  . DDD (degenerative disc disease), cervical 09/21/2017  . DDD (degenerative disc disease), lumbar 09/21/2017  . Inflamed sebaceous cyst 12/04/2011  . Hypertension 12/04/2011  . GERD (gastroesophageal reflux disease) 12/04/2011  . Allergic rhinitis 12/04/2011  . Hyperlipidemia 12/04/2011    Past Medical History:  Diagnosis Date  . Allergic rhinitis, cause unspecified   . Benign heart murmur   . GERD (gastroesophageal reflux disease)   . Hypertension   . Inflamed sebaceous cyst   . Insomnia, unspecified   . Urine incontinence   . Vitamin D deficiency     Family History  Problem Relation Age of Onset  . Lung cancer Mother   . Arthritis Mother   . Hypertension Mother   . Hypertension Father   . Stroke Father   . Diabetes Father   . Diabetes Sister   . Hypertension Sister   . Hypertension Son    Past Surgical History:  Procedure Laterality Date  . APPENDECTOMY    . BACK SURGERY    . CERVICAL SPINE SURGERY    . COLONOSCOPY  10/11  .  dexa scan  02/2010  . DIAGNOSTIC MAMMOGRAM  04/2010  . PELVIC FRACTURE SURGERY  1968  . SHOULDER SURGERY     complete right, partial left  . TUMOR REMOVAL     Social History   Social History Narrative  . Not on file    There is no immunization history on file for this patient.   Objective: Vital Signs: There were no vitals taken for this visit.   Physical Exam   Musculoskeletal Exam: ***  CDAI Exam: CDAI Score: Not documented Patient Global Assessment: Not documented; Provider Global Assessment: Not documented Swollen: Not documented; Tender: Not documented Joint Exam   Not documented   There is currently no information documented on the homunculus. Go to the Rheumatology activity and complete the homunculus joint exam.  Investigation: No additional findings.  Imaging: Dg Thoracolumabar Spine  Result Date: 03/06/2018 CLINICAL DATA:  Mid to lower back pain following a fall 1-2 weeks ago, greater on the right. EXAM: THORACOLUMBAR SPINE 1V COMPARISON:  None. FINDINGS: AP and lateral views of the lumbar and lower thoracic spine demonstrate mild levoconvex lumbar rotary scoliosis. There is mild to moderate lateral spur formation at multiple levels of the lumbar lower thoracic spine. There is marked disc space narrowing on the right at the L2-3 level with discogenic sclerosis and lateral spur formation. There are minimal superior endplate compression deformities of the L2 and L3 vertebral bodies with no visible acute fracture lines or bony retropulsion. Facet degenerative changes and marked disc space narrowing with discogenic sclerosis  are demonstrated at the L4-5 level, with associated grade 2 anterolisthesis. No visible pars defects. L5-S1 facet degenerative changes are also noted. Atheromatous arterial calcifications. IMPRESSION: 1. Minimal superior endplate compression deformities of the L2 and L3 vertebral bodies, age indeterminate. 2. Degenerative changes, as described above.  Electronically Signed   By: Claudie Revering M.D.   On: 03/06/2018 22:09    Recent Labs: Lab Results  Component Value Date   WBC 4.6 07/02/2016   HGB 13.6 07/02/2016   PLT 147 (L) 07/02/2016   NA 137 07/02/2016   K 4.8 07/02/2016   CL 102 07/02/2016   CO2 28 07/02/2016   GLUCOSE 98 07/02/2016   BUN 18 07/02/2016   CREATININE 0.79 07/02/2016   BILITOT 1.2 02/03/2008   ALKPHOS 69 02/03/2008   AST 23 02/03/2008   ALT 15 02/03/2008   PROT 7.3 02/03/2008   ALBUMIN 3.7 02/03/2008   CALCIUM 9.6 07/02/2016   GFRAA >60 07/02/2016    Speciality Comments: No specialty comments available.  Procedures:  No procedures performed Allergies: Penicillin v potassium   Assessment / Plan:     Visit Diagnoses: No diagnosis found.   Orders: No orders of the defined types were placed in this encounter.  No orders of the defined types were placed in this encounter.   Face-to-face time spent with patient was *** minutes. Greater than 50% of time was spent in counseling and coordination of care.  Follow-Up Instructions: No follow-ups on file.   Earnestine Mealing, CMA  Note - This record has been created using Editor, commissioning.  Chart creation errors have been sought, but may not always  have been located. Such creation errors do not reflect on  the standard of medical care.

## 2018-03-22 ENCOUNTER — Ambulatory Visit: Payer: Medicare Other | Admitting: Rheumatology

## 2018-04-24 ENCOUNTER — Ambulatory Visit: Payer: Medicare Other | Admitting: Rheumatology

## 2018-06-10 DIAGNOSIS — K219 Gastro-esophageal reflux disease without esophagitis: Secondary | ICD-10-CM | POA: Diagnosis not present

## 2018-06-10 DIAGNOSIS — E559 Vitamin D deficiency, unspecified: Secondary | ICD-10-CM | POA: Diagnosis not present

## 2018-06-10 DIAGNOSIS — R7303 Prediabetes: Secondary | ICD-10-CM | POA: Diagnosis not present

## 2018-06-10 DIAGNOSIS — E785 Hyperlipidemia, unspecified: Secondary | ICD-10-CM | POA: Diagnosis not present

## 2018-06-10 DIAGNOSIS — I10 Essential (primary) hypertension: Secondary | ICD-10-CM | POA: Diagnosis not present

## 2018-06-10 DIAGNOSIS — M1990 Unspecified osteoarthritis, unspecified site: Secondary | ICD-10-CM | POA: Diagnosis not present

## 2018-06-14 DIAGNOSIS — U071 COVID-19: Secondary | ICD-10-CM | POA: Diagnosis not present

## 2018-06-19 NOTE — Progress Notes (Signed)
Office Visit Note  Patient: Brittney Stewart             Date of Birth: Feb 06, 1940           MRN: [id number]             PCP: Audley Hose, MD Referring: Audley Hose, MD Visit Date: 07/02/2018 Occupation: @GUAROCC @  Subjective:  Lower back pain    History of Present Illness: Brittney Stewart is a 78 y.o. female with history of osteoarthritis, rheumatoid factor positive, and DDD.  She denies any joint swelling.  She continues to have intermittent pain in both hands.  She states about 3 months ago she fell and landed on her back. She states she had a x-ray on 03/06/18 which did not reveal any acute findings or fractures.  She continues to work on stretching and back exercises.   She has some discomfort in the left 2nd toe.  She denies any joint swelling.  She has tried using a toe spacer and has tried wearing different shoes.  Loss of fat pad on plantar aspect of both feet.  Her PCP has been monitoring her DEXA.   Activities of Daily Living:  Patient reports morning stiffness for 30 minutes.   Patient Reports nocturnal pain.  Difficulty dressing/grooming: Denies Difficulty climbing stairs: Denies Difficulty getting out of chair: Denies Difficulty using hands for taps, buttons, cutlery, and/or writing: Reports  Review of Systems  Constitutional: Negative for fatigue.  HENT: Negative for mouth sores, mouth dryness and nose dryness.   Eyes: Negative for pain, itching, visual disturbance and dryness.  Respiratory: Negative for cough, hemoptysis, shortness of breath and difficulty breathing.   Cardiovascular: Negative for chest pain, palpitations, hypertension and swelling in legs/feet.  Gastrointestinal: Negative for blood in stool, constipation and diarrhea.  Endocrine: Negative for increased urination.  Genitourinary: Negative for painful urination and pelvic pain.  Musculoskeletal: Positive for arthralgias, joint pain, joint swelling and morning stiffness. Negative for  myalgias, muscle weakness, muscle tenderness and myalgias.  Skin: Negative for color change, pallor, rash, hair loss, nodules/bumps, redness, skin tightness, ulcers and sensitivity to sunlight.  Allergic/Immunologic: Negative for susceptible to infections.  Neurological: Negative for dizziness, light-headedness, numbness, headaches, memory loss and weakness.  Hematological: Negative for swollen glands.  Psychiatric/Behavioral: Negative for depressed mood, confusion and sleep disturbance. The patient is not nervous/anxious.     PMFS History:  Patient Active Problem List   Diagnosis Date Noted   DDD (degenerative disc disease), cervical 09/21/2017   DDD (degenerative disc disease), lumbar 09/21/2017   Inflamed sebaceous cyst 12/04/2011   Hypertension 12/04/2011   GERD (gastroesophageal reflux disease) 12/04/2011   Allergic rhinitis 12/04/2011   Hyperlipidemia 12/04/2011    Past Medical History:  Diagnosis Date   Allergic rhinitis, cause unspecified    Benign heart murmur    GERD (gastroesophageal reflux disease)    Hypertension    Inflamed sebaceous cyst    Insomnia, unspecified    Urine incontinence    Vitamin D deficiency     Family History  Problem Relation Age of Onset   Lung cancer Mother    Arthritis Mother    Hypertension Mother    Hypertension Father    Stroke Father    Diabetes Father    Diabetes Sister    Hypertension Sister    Hypertension Son    Past Surgical History:  Procedure Laterality Date   APPENDECTOMY     BACK SURGERY     CERVICAL SPINE SURGERY  COLONOSCOPY  10/11   dexa scan  02/2010   DIAGNOSTIC MAMMOGRAM  04/2010   PELVIC FRACTURE SURGERY  1968   SHOULDER SURGERY     complete right, partial left   TUMOR REMOVAL     Social History   Social History Narrative   Not on file    There is no immunization history on file for this patient.   Objective: Vital Signs: BP 116/68 (BP Location: Left Arm, Patient  Position: Sitting, Cuff Size: Normal)    Pulse 64    Resp 12    Ht 5' 6.5" (1.689 m)    Wt 176 lb 6.4 oz (80 kg)    BMI 28.05 kg/m    Physical Exam Vitals signs and nursing note reviewed.  Constitutional:      Appearance: She is well-developed.  HENT:     Head: Normocephalic and atraumatic.  Eyes:     Conjunctiva/sclera: Conjunctivae normal.  Neck:     Musculoskeletal: Normal range of motion.  Cardiovascular:     Rate and Rhythm: Normal rate and regular rhythm.     Heart sounds: Normal heart sounds.  Pulmonary:     Effort: Pulmonary effort is normal.     Breath sounds: Normal breath sounds.  Abdominal:     General: Bowel sounds are normal.     Palpations: Abdomen is soft.  Lymphadenopathy:     Cervical: No cervical adenopathy.  Skin:    General: Skin is warm and dry.     Capillary Refill: Capillary refill takes less than 2 seconds.  Neurological:     Mental Status: She is alert and oriented to person, place, and time.  Psychiatric:        Behavior: Behavior normal.      Musculoskeletal Exam: C-spine, thoracic spine, and lumbar spine good ROM.  No midline spinal tenderness.  No SI joint tenderness.  Shoulder joints, elbow joints, wrist joints, MCPs, PIPs, and DIPs good ROM with no synovitis. CMC joint synovial thickening bilaterally.  PIP and DIP synovial thickening consistent with osteoarthritis.  Dupuytren's contracture of right ring finger.  Hip joints, knee joints, ankle joints, MTPs, PIPs, and DIPs good ROM with no synovitis.  No warmth or effusion of knee joints.  No tenderness or swelling of ankle joints.    CDAI Exam: CDAI Score: -- Patient Global: --; Provider Global: -- Swollen: --; Tender: -- Joint Exam   No joint exam has been documented for this visit   There is currently no information documented on the homunculus. Go to the Rheumatology activity and complete the homunculus joint exam.  Investigation: No additional findings.  Imaging: No results  found.  Recent Labs: Lab Results  Component Value Date   WBC 4.6 07/02/2016   HGB 13.6 07/02/2016   PLT 147 (L) 07/02/2016   NA 137 07/02/2016   K 4.8 07/02/2016   CL 102 07/02/2016   CO2 28 07/02/2016   GLUCOSE 98 07/02/2016   BUN 18 07/02/2016   CREATININE 0.79 07/02/2016   BILITOT 1.2 02/03/2008   ALKPHOS 69 02/03/2008   AST 23 02/03/2008   ALT 15 02/03/2008   PROT 7.3 02/03/2008   ALBUMIN 3.7 02/03/2008   CALCIUM 9.6 07/02/2016   GFRAA >60 07/02/2016    Speciality Comments: No specialty comments available.  Procedures:  No procedures performed Allergies: Penicillin v potassium   Assessment / Plan:     Visit Diagnoses: Primary osteoarthritis of both hands - RF+-She has PIP and DIP synovial thickening consistent with  osteoarthritis.  Bilateral CMC joint synovial thickening.  No synovitis or tenderness noted. Complete fist formation bilaterally.  Joint protection and muscle strengthening were discussed. She has no difficulty with ADLs.  She was advised to notify us if she develops increased joint pain or joint swelling.  She will follow up in 1 year.   Rheumatoid factor positive - She has no synovitis on exam.  She was advised to notify us if she develops increased joint pain or joint swelling.   Dupuytren's contracture of right hand - Chronic. She has no tenderness at this time. She continues to play the piano and has no difficulty with ADLs.   H/O total shoulder replacement, right - Doing well.  She has no discomfort at this time.   History of left shoulder replacement - Doing well.  She has good ROM with no discomfort.   Pes planus of both feet - Loss of fat pad on plantar surface of both feet.  We discussed the importance of wearing proper fitting shoes with padded socks.  She has been having discomfort in the left 2nd toe but no tenderness or synovitis was noted.  We discussed trying to wear toe spacers.   DDD (degenerative disc disease), cervical - She has good ROM  with no discomfort.  She has no symptoms of radiculopathy at this time.   DDD (degenerative disc disease), lumbar - She fell on her back about 3 months ago and landed on her back. She has had discomfort and stiffness intermittently since then.  She had a thoracolumbar spine x-ray on 03/06/18 that did not reveal any acute findings.  X-ray revealed minimal superior endplate compression deformities of L2 and L3 vertebral bodies.  Degenerative changes noted.  She declined physical therapy at this time.  She was given a handout of back exercises.  She was encouraged to continue to perform stretching exercises daily.   Other medical conditions are listed as follows:   History of hypertension   History of hyperlipidemia   History of gastroesophageal reflux (GERD)   History of seasonal allergies   Orders: No orders of the defined types were placed in this encounter.  No orders of the defined types were placed in this encounter.   Follow-Up Instructions: Return in 1 year (on 07/02/2019) for Osteoarthritis, DDD.   Ofilia Neas, PA-C   I examined and evaluated the patient with Hazel Sams PA.  Patient had no synovitis on my examination.  She continues to have some discomfort due to underlying osteoarthritis.  She has been also experiencing some lower back pain.  Have given her a handout on back exercises.  The plan of care was discussed as noted above.  Bo Merino, MD  Note - This record has been created using Editor, commissioning.  Chart creation errors have been sought, but may not always  have been located. Such creation errors do not reflect on  the standard of medical care.

## 2018-07-02 ENCOUNTER — Encounter: Payer: Self-pay | Admitting: Rheumatology

## 2018-07-02 ENCOUNTER — Other Ambulatory Visit: Payer: Self-pay

## 2018-07-02 ENCOUNTER — Ambulatory Visit (INDEPENDENT_AMBULATORY_CARE_PROVIDER_SITE_OTHER): Payer: Medicare Other | Admitting: Rheumatology

## 2018-07-02 VITALS — BP 116/68 | HR 64 | Resp 12 | Ht 66.5 in | Wt 176.4 lb

## 2018-07-02 DIAGNOSIS — Z889 Allergy status to unspecified drugs, medicaments and biological substances status: Secondary | ICD-10-CM | POA: Diagnosis not present

## 2018-07-02 DIAGNOSIS — Z8679 Personal history of other diseases of the circulatory system: Secondary | ICD-10-CM | POA: Diagnosis not present

## 2018-07-02 DIAGNOSIS — Z8719 Personal history of other diseases of the digestive system: Secondary | ICD-10-CM

## 2018-07-02 DIAGNOSIS — M5136 Other intervertebral disc degeneration, lumbar region: Secondary | ICD-10-CM

## 2018-07-02 DIAGNOSIS — Z96612 Presence of left artificial shoulder joint: Secondary | ICD-10-CM | POA: Diagnosis not present

## 2018-07-02 DIAGNOSIS — M5030 Other cervical disc degeneration, unspecified cervical region: Secondary | ICD-10-CM

## 2018-07-02 DIAGNOSIS — M2141 Flat foot [pes planus] (acquired), right foot: Secondary | ICD-10-CM

## 2018-07-02 DIAGNOSIS — M19041 Primary osteoarthritis, right hand: Secondary | ICD-10-CM

## 2018-07-02 DIAGNOSIS — R768 Other specified abnormal immunological findings in serum: Secondary | ICD-10-CM | POA: Diagnosis not present

## 2018-07-02 DIAGNOSIS — Z96611 Presence of right artificial shoulder joint: Secondary | ICD-10-CM | POA: Diagnosis not present

## 2018-07-02 DIAGNOSIS — Z8639 Personal history of other endocrine, nutritional and metabolic disease: Secondary | ICD-10-CM | POA: Diagnosis not present

## 2018-07-02 DIAGNOSIS — M2142 Flat foot [pes planus] (acquired), left foot: Secondary | ICD-10-CM

## 2018-07-02 DIAGNOSIS — M19042 Primary osteoarthritis, left hand: Secondary | ICD-10-CM

## 2018-07-02 DIAGNOSIS — M51369 Other intervertebral disc degeneration, lumbar region without mention of lumbar back pain or lower extremity pain: Secondary | ICD-10-CM

## 2018-07-02 DIAGNOSIS — M720 Palmar fascial fibromatosis [Dupuytren]: Secondary | ICD-10-CM

## 2018-07-02 NOTE — Patient Instructions (Signed)

## 2018-07-29 DIAGNOSIS — Z961 Presence of intraocular lens: Secondary | ICD-10-CM | POA: Diagnosis not present

## 2018-07-30 DIAGNOSIS — M9901 Segmental and somatic dysfunction of cervical region: Secondary | ICD-10-CM | POA: Diagnosis not present

## 2018-07-30 DIAGNOSIS — M62830 Muscle spasm of back: Secondary | ICD-10-CM | POA: Diagnosis not present

## 2018-07-30 DIAGNOSIS — M47812 Spondylosis without myelopathy or radiculopathy, cervical region: Secondary | ICD-10-CM | POA: Diagnosis not present

## 2018-07-30 DIAGNOSIS — M47816 Spondylosis without myelopathy or radiculopathy, lumbar region: Secondary | ICD-10-CM | POA: Diagnosis not present

## 2018-07-30 DIAGNOSIS — M9903 Segmental and somatic dysfunction of lumbar region: Secondary | ICD-10-CM | POA: Diagnosis not present

## 2018-07-30 DIAGNOSIS — M9902 Segmental and somatic dysfunction of thoracic region: Secondary | ICD-10-CM | POA: Diagnosis not present

## 2018-07-31 DIAGNOSIS — M9901 Segmental and somatic dysfunction of cervical region: Secondary | ICD-10-CM | POA: Diagnosis not present

## 2018-07-31 DIAGNOSIS — M62830 Muscle spasm of back: Secondary | ICD-10-CM | POA: Diagnosis not present

## 2018-07-31 DIAGNOSIS — M47816 Spondylosis without myelopathy or radiculopathy, lumbar region: Secondary | ICD-10-CM | POA: Diagnosis not present

## 2018-07-31 DIAGNOSIS — M9902 Segmental and somatic dysfunction of thoracic region: Secondary | ICD-10-CM | POA: Diagnosis not present

## 2018-07-31 DIAGNOSIS — M9903 Segmental and somatic dysfunction of lumbar region: Secondary | ICD-10-CM | POA: Diagnosis not present

## 2018-07-31 DIAGNOSIS — M47812 Spondylosis without myelopathy or radiculopathy, cervical region: Secondary | ICD-10-CM | POA: Diagnosis not present

## 2018-08-01 ENCOUNTER — Other Ambulatory Visit: Payer: Self-pay

## 2018-08-05 DIAGNOSIS — M47816 Spondylosis without myelopathy or radiculopathy, lumbar region: Secondary | ICD-10-CM | POA: Diagnosis not present

## 2018-08-05 DIAGNOSIS — M9902 Segmental and somatic dysfunction of thoracic region: Secondary | ICD-10-CM | POA: Diagnosis not present

## 2018-08-05 DIAGNOSIS — M47812 Spondylosis without myelopathy or radiculopathy, cervical region: Secondary | ICD-10-CM | POA: Diagnosis not present

## 2018-08-05 DIAGNOSIS — M9901 Segmental and somatic dysfunction of cervical region: Secondary | ICD-10-CM | POA: Diagnosis not present

## 2018-08-05 DIAGNOSIS — M9903 Segmental and somatic dysfunction of lumbar region: Secondary | ICD-10-CM | POA: Diagnosis not present

## 2018-08-05 DIAGNOSIS — M62830 Muscle spasm of back: Secondary | ICD-10-CM | POA: Diagnosis not present

## 2018-08-06 DIAGNOSIS — M47816 Spondylosis without myelopathy or radiculopathy, lumbar region: Secondary | ICD-10-CM | POA: Diagnosis not present

## 2018-08-06 DIAGNOSIS — M9901 Segmental and somatic dysfunction of cervical region: Secondary | ICD-10-CM | POA: Diagnosis not present

## 2018-08-06 DIAGNOSIS — M9903 Segmental and somatic dysfunction of lumbar region: Secondary | ICD-10-CM | POA: Diagnosis not present

## 2018-08-06 DIAGNOSIS — M47812 Spondylosis without myelopathy or radiculopathy, cervical region: Secondary | ICD-10-CM | POA: Diagnosis not present

## 2018-08-06 DIAGNOSIS — M62830 Muscle spasm of back: Secondary | ICD-10-CM | POA: Diagnosis not present

## 2018-08-06 DIAGNOSIS — M9902 Segmental and somatic dysfunction of thoracic region: Secondary | ICD-10-CM | POA: Diagnosis not present

## 2018-08-07 DIAGNOSIS — M9901 Segmental and somatic dysfunction of cervical region: Secondary | ICD-10-CM | POA: Diagnosis not present

## 2018-08-07 DIAGNOSIS — M47816 Spondylosis without myelopathy or radiculopathy, lumbar region: Secondary | ICD-10-CM | POA: Diagnosis not present

## 2018-08-07 DIAGNOSIS — M62830 Muscle spasm of back: Secondary | ICD-10-CM | POA: Diagnosis not present

## 2018-08-07 DIAGNOSIS — M47812 Spondylosis without myelopathy or radiculopathy, cervical region: Secondary | ICD-10-CM | POA: Diagnosis not present

## 2018-08-07 DIAGNOSIS — M9903 Segmental and somatic dysfunction of lumbar region: Secondary | ICD-10-CM | POA: Diagnosis not present

## 2018-08-07 DIAGNOSIS — M9902 Segmental and somatic dysfunction of thoracic region: Secondary | ICD-10-CM | POA: Diagnosis not present

## 2018-08-12 DIAGNOSIS — M9903 Segmental and somatic dysfunction of lumbar region: Secondary | ICD-10-CM | POA: Diagnosis not present

## 2018-08-12 DIAGNOSIS — M9902 Segmental and somatic dysfunction of thoracic region: Secondary | ICD-10-CM | POA: Diagnosis not present

## 2018-08-12 DIAGNOSIS — M47816 Spondylosis without myelopathy or radiculopathy, lumbar region: Secondary | ICD-10-CM | POA: Diagnosis not present

## 2018-08-12 DIAGNOSIS — M62830 Muscle spasm of back: Secondary | ICD-10-CM | POA: Diagnosis not present

## 2018-08-12 DIAGNOSIS — M9901 Segmental and somatic dysfunction of cervical region: Secondary | ICD-10-CM | POA: Diagnosis not present

## 2018-08-12 DIAGNOSIS — M47812 Spondylosis without myelopathy or radiculopathy, cervical region: Secondary | ICD-10-CM | POA: Diagnosis not present

## 2018-08-14 DIAGNOSIS — M47816 Spondylosis without myelopathy or radiculopathy, lumbar region: Secondary | ICD-10-CM | POA: Diagnosis not present

## 2018-08-14 DIAGNOSIS — M9901 Segmental and somatic dysfunction of cervical region: Secondary | ICD-10-CM | POA: Diagnosis not present

## 2018-08-14 DIAGNOSIS — M9902 Segmental and somatic dysfunction of thoracic region: Secondary | ICD-10-CM | POA: Diagnosis not present

## 2018-08-14 DIAGNOSIS — M47812 Spondylosis without myelopathy or radiculopathy, cervical region: Secondary | ICD-10-CM | POA: Diagnosis not present

## 2018-08-14 DIAGNOSIS — M9903 Segmental and somatic dysfunction of lumbar region: Secondary | ICD-10-CM | POA: Diagnosis not present

## 2018-08-14 DIAGNOSIS — M62830 Muscle spasm of back: Secondary | ICD-10-CM | POA: Diagnosis not present

## 2018-08-19 DIAGNOSIS — M47816 Spondylosis without myelopathy or radiculopathy, lumbar region: Secondary | ICD-10-CM | POA: Diagnosis not present

## 2018-08-19 DIAGNOSIS — M9903 Segmental and somatic dysfunction of lumbar region: Secondary | ICD-10-CM | POA: Diagnosis not present

## 2018-08-19 DIAGNOSIS — M62830 Muscle spasm of back: Secondary | ICD-10-CM | POA: Diagnosis not present

## 2018-08-19 DIAGNOSIS — M9901 Segmental and somatic dysfunction of cervical region: Secondary | ICD-10-CM | POA: Diagnosis not present

## 2018-08-19 DIAGNOSIS — M47812 Spondylosis without myelopathy or radiculopathy, cervical region: Secondary | ICD-10-CM | POA: Diagnosis not present

## 2018-08-19 DIAGNOSIS — M9902 Segmental and somatic dysfunction of thoracic region: Secondary | ICD-10-CM | POA: Diagnosis not present

## 2018-08-20 DIAGNOSIS — M47816 Spondylosis without myelopathy or radiculopathy, lumbar region: Secondary | ICD-10-CM | POA: Diagnosis not present

## 2018-08-20 DIAGNOSIS — M47812 Spondylosis without myelopathy or radiculopathy, cervical region: Secondary | ICD-10-CM | POA: Diagnosis not present

## 2018-08-20 DIAGNOSIS — M9901 Segmental and somatic dysfunction of cervical region: Secondary | ICD-10-CM | POA: Diagnosis not present

## 2018-08-20 DIAGNOSIS — M9902 Segmental and somatic dysfunction of thoracic region: Secondary | ICD-10-CM | POA: Diagnosis not present

## 2018-08-20 DIAGNOSIS — M62830 Muscle spasm of back: Secondary | ICD-10-CM | POA: Diagnosis not present

## 2018-08-20 DIAGNOSIS — M9903 Segmental and somatic dysfunction of lumbar region: Secondary | ICD-10-CM | POA: Diagnosis not present

## 2018-08-21 DIAGNOSIS — M9902 Segmental and somatic dysfunction of thoracic region: Secondary | ICD-10-CM | POA: Diagnosis not present

## 2018-08-21 DIAGNOSIS — M47812 Spondylosis without myelopathy or radiculopathy, cervical region: Secondary | ICD-10-CM | POA: Diagnosis not present

## 2018-08-21 DIAGNOSIS — M62830 Muscle spasm of back: Secondary | ICD-10-CM | POA: Diagnosis not present

## 2018-08-21 DIAGNOSIS — M9901 Segmental and somatic dysfunction of cervical region: Secondary | ICD-10-CM | POA: Diagnosis not present

## 2018-08-21 DIAGNOSIS — M9903 Segmental and somatic dysfunction of lumbar region: Secondary | ICD-10-CM | POA: Diagnosis not present

## 2018-08-21 DIAGNOSIS — M47816 Spondylosis without myelopathy or radiculopathy, lumbar region: Secondary | ICD-10-CM | POA: Diagnosis not present

## 2018-08-27 DIAGNOSIS — M62830 Muscle spasm of back: Secondary | ICD-10-CM | POA: Diagnosis not present

## 2018-08-27 DIAGNOSIS — M47812 Spondylosis without myelopathy or radiculopathy, cervical region: Secondary | ICD-10-CM | POA: Diagnosis not present

## 2018-08-27 DIAGNOSIS — M9901 Segmental and somatic dysfunction of cervical region: Secondary | ICD-10-CM | POA: Diagnosis not present

## 2018-08-27 DIAGNOSIS — M9903 Segmental and somatic dysfunction of lumbar region: Secondary | ICD-10-CM | POA: Diagnosis not present

## 2018-08-27 DIAGNOSIS — M47816 Spondylosis without myelopathy or radiculopathy, lumbar region: Secondary | ICD-10-CM | POA: Diagnosis not present

## 2018-08-27 DIAGNOSIS — M9902 Segmental and somatic dysfunction of thoracic region: Secondary | ICD-10-CM | POA: Diagnosis not present

## 2018-08-28 DIAGNOSIS — M47816 Spondylosis without myelopathy or radiculopathy, lumbar region: Secondary | ICD-10-CM | POA: Diagnosis not present

## 2018-08-28 DIAGNOSIS — M9903 Segmental and somatic dysfunction of lumbar region: Secondary | ICD-10-CM | POA: Diagnosis not present

## 2018-08-28 DIAGNOSIS — M9902 Segmental and somatic dysfunction of thoracic region: Secondary | ICD-10-CM | POA: Diagnosis not present

## 2018-08-28 DIAGNOSIS — M47812 Spondylosis without myelopathy or radiculopathy, cervical region: Secondary | ICD-10-CM | POA: Diagnosis not present

## 2018-08-28 DIAGNOSIS — M62830 Muscle spasm of back: Secondary | ICD-10-CM | POA: Diagnosis not present

## 2018-08-28 DIAGNOSIS — M9901 Segmental and somatic dysfunction of cervical region: Secondary | ICD-10-CM | POA: Diagnosis not present

## 2018-09-03 DIAGNOSIS — E559 Vitamin D deficiency, unspecified: Secondary | ICD-10-CM | POA: Diagnosis not present

## 2018-09-03 DIAGNOSIS — I10 Essential (primary) hypertension: Secondary | ICD-10-CM | POA: Diagnosis not present

## 2018-09-03 DIAGNOSIS — Z131 Encounter for screening for diabetes mellitus: Secondary | ICD-10-CM | POA: Diagnosis not present

## 2018-09-03 DIAGNOSIS — E782 Mixed hyperlipidemia: Secondary | ICD-10-CM | POA: Diagnosis not present

## 2018-09-03 DIAGNOSIS — R7303 Prediabetes: Secondary | ICD-10-CM | POA: Diagnosis not present

## 2018-10-07 DIAGNOSIS — E785 Hyperlipidemia, unspecified: Secondary | ICD-10-CM | POA: Diagnosis not present

## 2018-10-07 DIAGNOSIS — I10 Essential (primary) hypertension: Secondary | ICD-10-CM | POA: Diagnosis not present

## 2018-10-07 DIAGNOSIS — M1990 Unspecified osteoarthritis, unspecified site: Secondary | ICD-10-CM | POA: Diagnosis not present

## 2018-10-07 DIAGNOSIS — R7303 Prediabetes: Secondary | ICD-10-CM | POA: Diagnosis not present

## 2018-10-07 DIAGNOSIS — K219 Gastro-esophageal reflux disease without esophagitis: Secondary | ICD-10-CM | POA: Diagnosis not present

## 2018-10-08 DIAGNOSIS — Z23 Encounter for immunization: Secondary | ICD-10-CM | POA: Diagnosis not present

## 2019-01-13 NOTE — Progress Notes (Signed)
Office Visit Note  Patient: Brittney Stewart             Date of Birth: 19-Aug-1940           MRN: [id number]             PCP: Audley Hose, MD Referring: Audley Hose, MD Visit Date: 01/16/2019 Occupation: @GUAROCC @  Subjective:  Low back pain   History of Present Illness: Brittney Stewart is a 79 y.o. female with history of osteoarthritis and DDD.  She states she fell walking her [place] in February 2020.  She was evaluated on 03/06/18 and had x-rays at that time. She was seen by a chiropractor.  She has been experiencing ongoing lower back pain.  She rates her pain a 5/10 but at times it is a 8/10.  She reports she previously used to see Dr. Ellene Route but it has been many years since she was evaluated in his office. She has noticed height loss and knows she has had a DEXA in the past but is not sure when. She denies any other joint pain or inflammation at this time.   Activities of Daily Living:  Patient reports morning stiffness for several hours.   Patient Reports nocturnal pain.  Difficulty dressing/grooming: Denies Difficulty climbing stairs: Reports Difficulty getting out of chair: Denies Difficulty using hands for taps, buttons, cutlery, and/or writing: Reports  Review of Systems  Constitutional: Negative for fatigue.  HENT: Positive for mouth dryness. Negative for mouth sores and nose dryness.   Eyes: Negative for pain, itching, visual disturbance and dryness.  Respiratory: Negative for cough, hemoptysis, shortness of breath, wheezing and difficulty breathing.   Cardiovascular: Negative for chest pain, palpitations, hypertension and swelling in legs/feet.  Gastrointestinal: Negative for blood in stool, constipation and diarrhea.  Endocrine: Negative for increased urination.  Genitourinary: Negative for difficulty urinating and painful urination.  Musculoskeletal: Positive for arthralgias, joint pain, joint swelling and morning stiffness. Negative for myalgias,  muscle weakness, muscle tenderness and myalgias.  Skin: Negative for color change, pallor, rash, hair loss, nodules/bumps, skin tightness, ulcers and sensitivity to sunlight.  Allergic/Immunologic: Negative for susceptible to infections.  Neurological: Positive for numbness. Negative for dizziness, headaches, memory loss and weakness.  Hematological: Negative for swollen glands.  Psychiatric/Behavioral: Negative for depressed mood, confusion and sleep disturbance. The patient is not nervous/anxious.     PMFS History:  Patient Active Problem List   Diagnosis Date Noted  . DDD (degenerative disc disease), cervical 09/21/2017  . DDD (degenerative disc disease), lumbar 09/21/2017  . Inflamed sebaceous cyst 12/04/2011  . Hypertension 12/04/2011  . GERD (gastroesophageal reflux disease) 12/04/2011  . Allergic rhinitis 12/04/2011  . Hyperlipidemia 12/04/2011    Past Medical History:  Diagnosis Date  . Allergic rhinitis, cause unspecified   . Benign heart murmur   . GERD (gastroesophageal reflux disease)   . Hypertension   . Inflamed sebaceous cyst   . Insomnia, unspecified   . Urine incontinence   . Vitamin D deficiency     Family History  Problem Relation Age of Onset  . Lung cancer Mother   . Arthritis Mother   . Hypertension Mother   . Hypertension Father   . Stroke Father   . Diabetes Father   . Diabetes Sister   . Hypertension Sister   . Hypertension Son    Past Surgical History:  Procedure Laterality Date  . APPENDECTOMY    . BACK SURGERY    . CERVICAL SPINE SURGERY    .  COLONOSCOPY  10/11  . dexa scan  02/2010  . DIAGNOSTIC MAMMOGRAM  04/2010  . PELVIC FRACTURE SURGERY  1968  . SHOULDER SURGERY     complete right, partial left  . TUMOR REMOVAL     Social History   Social History Narrative  . Not on file   Immunization History  Administered Date(s) Administered  . Influenza-Unspecified 01/02/2018     Objective: Vital Signs: BP (!) 150/78 (BP Location:  Left Arm, Patient Position: Sitting, Cuff Size: Normal)   Pulse (!) 59   Resp 13   Ht 5' 5.75" (1.67 m)   Wt 176 lb 3.2 oz (79.9 kg)   BMI 28.66 kg/m    Physical Exam Vitals and nursing note reviewed.  Constitutional:      Appearance: She is well-developed.  HENT:     Head: Normocephalic and atraumatic.  Eyes:     Conjunctiva/sclera: Conjunctivae normal.  Cardiovascular:     Rate and Rhythm: Normal rate and regular rhythm.     Heart sounds: Normal heart sounds.  Pulmonary:     Effort: Pulmonary effort is normal.     Breath sounds: Normal breath sounds.  Abdominal:     General: Bowel sounds are normal.     Palpations: Abdomen is soft.  Musculoskeletal:     Cervical back: Normal range of motion.  Lymphadenopathy:     Cervical: No cervical adenopathy.  Skin:    General: Skin is warm and dry.     Capillary Refill: Capillary refill takes less than 2 seconds.  Neurological:     Mental Status: She is alert and oriented to person, place, and time.  Psychiatric:        Behavior: Behavior normal.      Musculoskeletal Exam: C-spine good ROM.  No midline spinal tenderness. No SI joint tenderness. Limited abduction in bilateral shoulder replacements. Elbow joints, wrist joints, MCPs, PIPs, and DIPs good ROM with no synovitis.  CMC joint synovial thickening noted bilaterally.  Hip joints, knee joints, ankle joints, MTPs, PIPs, and DIPs good ROM with no synovitis.  No warmth or effusion of knee joints. No tenderness or swelling of ankle joints.   CDAI Exam: CDAI Score: -- Patient Global: --; Provider Global: -- Swollen: --; Tender: -- Joint Exam 01/16/2019   No joint exam has been documented for this visit   There is currently no information documented on the homunculus. Go to the Rheumatology activity and complete the homunculus joint exam.  Investigation: No additional findings.  Imaging: XR Thoracic Spine 2 View  Result Date: 01/16/2019 Dextroscoliosis was noted.   Multilevel spondylosis with anterior spurring was noted.  T9 compression fracture was noted. Impression: These findings are consistent with dextroscoliosis, multilevel spondylosis and T9 compression fracture.  XR Lumbar Spine 2-3 Views  Result Date: 01/16/2019 Levoscoliosis was noted.  Multilevel spondylosis was noted.  L4-L5 significant spondylolisthesis was noted.  L1 and L2 compression fractures are noted.  Facet joint arthropathy was noted. Impression: These findings are consistent with levoscoliosis, multilevel spondylosis, L4-5 spondylolisthesis and L1 -L2 compression fractures.   Recent Labs: Lab Results  Component Value Date   WBC 4.6 07/02/2016   HGB 13.6 07/02/2016   PLT 147 (L) 07/02/2016   NA 137 07/02/2016   K 4.8 07/02/2016   CL 102 07/02/2016   CO2 28 07/02/2016   GLUCOSE 98 07/02/2016   BUN 18 07/02/2016   CREATININE 0.79 07/02/2016   BILITOT 1.2 02/03/2008   ALKPHOS 69 02/03/2008   AST 23  02/03/2008   ALT 15 02/03/2008   PROT 7.3 02/03/2008   ALBUMIN 3.7 02/03/2008   CALCIUM 9.6 07/02/2016   GFRAA >60 07/02/2016    Speciality Comments: No specialty comments available.  Procedures:  No procedures performed Allergies: Penicillin v potassium     Assessment / Plan:     Visit Diagnoses: Primary osteoarthritis of both hands - RF+: She has no synovitis or tenderness on exam. She has CMC joint synovial thickening bilaterally.  She has complete fist formation bilaterally.  Joint protection and muscle strengthening were discussed.    Rheumatoid factor positive: No synovitis on exam. No features of rheumatoid arthritis.   Dupuytren's contracture of right hand: Unchanged.   H/O total shoulder replacement, right: Doing well. She has limited abduction.  She is not experiencing any discomfort at this time.   History of left shoulder replacement: Doing well.  Limited abduction. She has no discomfort at this time.   Pes planus of both feet - She is not experiencing  any feet pain or inflammation at this time.  DDD (degenerative disc disease), cervical: She has good ROM with no discomfort.  She has no symptoms of radiculopathy at this time.   DDD (degenerative disc disease), lumbar -  She has persistent lower back pain since February 2020 after a fall while walking her dog. She has no symptoms of radiculopathy.  X-rays of the lumbar spine were updated today.    Result Date: 01/16/2019 Levoscoliosis was noted.  Multilevel spondylosis was noted.  L4-L5 significant spondylolisthesis was noted.  L1 and L2 compression fractures are noted.  Facet joint arthropathy was noted. Impression: These findings are consistent with levoscoliosis, multilevel spondylosis, L4-5 spondylolisthesis and L1 -L2 compression fractures We reviewed x-ray results.  She has seen Dr. Ellene Route in the past, and she would like a new referral to discuss treatment options.  She would also like to proceed with ordering a MRI of her lumbar due to the severe pain she is experiencing and to determine if the compression fractures are acute.  Order placed for DEXA.    Chronic midline low back pain without sciatica -She fell in February 2020 while walking her [place]. She was evaluated on 03/06/18 and thoracolumbar spine x-rays revealed minimal superior endplate compression deformities of L2 and L3 vertebral bodies, age indeterminate.  She is not taking anything for osteoporosis management. According to the patient she has had a normal DEXA in the past.  We do not have these records available.  She has noticed height loss in the past several years. She has persistent lower back pain and stiffness.  She is not experiencing any symptoms of radiculopathy at this time. She has good ROM of the thoracic and lumbar spine on exam.  No midline spinal tenderness or SI joint tenderness noted.  X-rays of the thoracic and lumbar spine were obtained today.    Plan: XR Lumbar Spine 2-3 Views  Pain in thoracic spine  -She has no midline spinal tenderness on exam today.  Upon review of x-rays on 03/06/18 she had a minimal superior endplate compression deformities of the L2 and L3 vertebral bodies, age indeterminate.  X-rays of the thoracic spine were obtained today to assess for vertebral fractures.  Plan: XR Thoracic Spine 2 View Result Date: 01/16/2019 Dextroscoliosis was noted.  Multilevel spondylosis with anterior spurring was noted.  T9 compression fracture was noted. Impression: These findings are consistent with dextroscoliosis, multilevel spondylosis and T9 compression fracture. Order placed for DEXA today.  History of vertebral fracture: T9, L1, L2-Order placed for DEXA.   Postmenopausal: Order for DEXA placed today.   Other medical conditions are listed as follows:   History of hypertension  History of gastroesophageal reflux (GERD)  History of hyperlipidemia  History of seasonal allergies  Orders: Orders Placed This Encounter  Procedures  . XR Thoracic Spine 2 View  . XR Lumbar Spine 2-3 Views  . DG BONE DENSITY (DXA)  . MR LUMBAR SPINE WO CONTRAST  . Ambulatory referral to Neurosurgery   No orders of the defined types were placed in this encounter.   Face-to-face time spent with patient was 73minutes. Greater than 50% of time was spent in counseling and coordination of care.  Follow-Up Instructions: Return in about 4 weeks (around 02/13/2019) for Osteoarthritis, DDD.   Bo Merino, MD   Scribed by-  Hazel Sams, PA-C    Note - This record has been created using Dragon software.  Chart creation errors have been sought, but may not always  have been located. Such creation errors do not reflect on  the standard of medical care.

## 2019-01-16 ENCOUNTER — Encounter: Payer: Self-pay | Admitting: Rheumatology

## 2019-01-16 ENCOUNTER — Ambulatory Visit (INDEPENDENT_AMBULATORY_CARE_PROVIDER_SITE_OTHER): Payer: Medicare Other

## 2019-01-16 ENCOUNTER — Ambulatory Visit (INDEPENDENT_AMBULATORY_CARE_PROVIDER_SITE_OTHER): Payer: Medicare Other | Admitting: Rheumatology

## 2019-01-16 ENCOUNTER — Other Ambulatory Visit: Payer: Self-pay

## 2019-01-16 VITALS — BP 150/78 | HR 59 | Resp 13 | Ht 65.75 in | Wt 176.2 lb

## 2019-01-16 DIAGNOSIS — Z8679 Personal history of other diseases of the circulatory system: Secondary | ICD-10-CM | POA: Diagnosis not present

## 2019-01-16 DIAGNOSIS — M2141 Flat foot [pes planus] (acquired), right foot: Secondary | ICD-10-CM | POA: Diagnosis not present

## 2019-01-16 DIAGNOSIS — Z8639 Personal history of other endocrine, nutritional and metabolic disease: Secondary | ICD-10-CM

## 2019-01-16 DIAGNOSIS — Z8719 Personal history of other diseases of the digestive system: Secondary | ICD-10-CM | POA: Diagnosis not present

## 2019-01-16 DIAGNOSIS — R768 Other specified abnormal immunological findings in serum: Secondary | ICD-10-CM

## 2019-01-16 DIAGNOSIS — M19041 Primary osteoarthritis, right hand: Secondary | ICD-10-CM

## 2019-01-16 DIAGNOSIS — M545 Low back pain, unspecified: Secondary | ICD-10-CM

## 2019-01-16 DIAGNOSIS — M2142 Flat foot [pes planus] (acquired), left foot: Secondary | ICD-10-CM

## 2019-01-16 DIAGNOSIS — Z889 Allergy status to unspecified drugs, medicaments and biological substances status: Secondary | ICD-10-CM | POA: Diagnosis not present

## 2019-01-16 DIAGNOSIS — M5030 Other cervical disc degeneration, unspecified cervical region: Secondary | ICD-10-CM

## 2019-01-16 DIAGNOSIS — M19042 Primary osteoarthritis, left hand: Secondary | ICD-10-CM

## 2019-01-16 DIAGNOSIS — M51369 Other intervertebral disc degeneration, lumbar region without mention of lumbar back pain or lower extremity pain: Secondary | ICD-10-CM

## 2019-01-16 DIAGNOSIS — Z96611 Presence of right artificial shoulder joint: Secondary | ICD-10-CM

## 2019-01-16 DIAGNOSIS — Z96612 Presence of left artificial shoulder joint: Secondary | ICD-10-CM

## 2019-01-16 DIAGNOSIS — M546 Pain in thoracic spine: Secondary | ICD-10-CM | POA: Diagnosis not present

## 2019-01-16 DIAGNOSIS — Z8781 Personal history of (healed) traumatic fracture: Secondary | ICD-10-CM

## 2019-01-16 DIAGNOSIS — M720 Palmar fascial fibromatosis [Dupuytren]: Secondary | ICD-10-CM | POA: Diagnosis not present

## 2019-01-16 DIAGNOSIS — G8929 Other chronic pain: Secondary | ICD-10-CM

## 2019-01-16 DIAGNOSIS — M5136 Other intervertebral disc degeneration, lumbar region: Secondary | ICD-10-CM | POA: Diagnosis not present

## 2019-01-16 DIAGNOSIS — Z780 Asymptomatic menopausal state: Secondary | ICD-10-CM

## 2019-01-17 ENCOUNTER — Ambulatory Visit: Payer: Medicare Other | Attending: Internal Medicine

## 2019-01-17 DIAGNOSIS — Z23 Encounter for immunization: Secondary | ICD-10-CM | POA: Diagnosis not present

## 2019-01-17 NOTE — Progress Notes (Signed)
   Covid-19 Vaccination Clinic  Name:  Brittney Stewart    MRN: [id number] DOB: March 13, 1940  01/17/2019  [name] was observed post Covid-19 immunization for 15 minutes without incidence. She was provided with Vaccine Information Sheet and instruction to access the V-Safe system.   [name] was instructed to call 911 with any severe reactions post vaccine: Marland Kitchen Difficulty breathing  . Swelling of your face and throat  . A fast heartbeat  . A bad rash all over your body  . Dizziness and weakness    Immunizations Administered    Name Date Dose VIS Date Route   Pfizer COVID-19 Vaccine 01/17/2019 11:42 AM 0.3 mL 12/13/2018 Intramuscular   Manufacturer: [place]   Lot: [id number]   [place]: [id number]

## 2019-01-22 ENCOUNTER — Telehealth: Payer: Self-pay | Admitting: Rheumatology

## 2019-01-22 NOTE — Telephone Encounter (Signed)
Order faxed to [facility] 01/21/2019, I called patient, patient will call to schedule appt.

## 2019-01-22 NOTE — Telephone Encounter (Signed)
Patient advised the order was faxed yesterday and being re-faxed this morning. Patient advised she is welcome to come by and pick up a copy of the order. Patient states she will call [facility] this afternoon to see if they have it and if not she will pick it up in the morning.

## 2019-01-22 NOTE — Telephone Encounter (Signed)
Patient called stating she was able to schedule her bone density scan tomorrow 01/23/19 at 10:00 am at [facility].  Patient states she was told she needed to bring a copy of the order with her or they would not do the scan.  Patient is requesting a return call.

## 2019-01-22 NOTE — Telephone Encounter (Signed)
I called patient to advise her she is all set to schedule her MRI. Patient states Dr. Estanislado Pandy also mentioned her having a Bone Density, but she has not heard anything in regards to this. Patient states she thinks she had her last one done at [facility], but was not very pleased with that experience. Patient would prefer to go elsewhere if possible. Please call to advise.

## 2019-01-23 ENCOUNTER — Encounter: Payer: Self-pay | Admitting: Rheumatology

## 2019-01-23 DIAGNOSIS — Z780 Asymptomatic menopausal state: Secondary | ICD-10-CM | POA: Diagnosis not present

## 2019-01-23 DIAGNOSIS — M85852 Other specified disorders of bone density and structure, left thigh: Secondary | ICD-10-CM | POA: Diagnosis not present

## 2019-01-24 ENCOUNTER — Telehealth: Payer: Self-pay | Admitting: *Deleted

## 2019-01-24 NOTE — Telephone Encounter (Signed)
Bone density on 01/23/2019 at [facility], reviewed by Dr. Bo Merino T-score: 0.737 BMD: -1.0 Bone Density within normal limits.  Next bone density 5 years. I called patient with results.

## 2019-01-30 ENCOUNTER — Ambulatory Visit (HOSPITAL_COMMUNITY)
Admission: RE | Admit: 2019-01-30 | Discharge: 2019-01-30 | Disposition: A | Discharge status: Home / Self Care | Payer: Medicare Other | Source: Ambulatory Visit | Attending: Physician Assistant | Admitting: Physician Assistant

## 2019-01-30 ENCOUNTER — Ambulatory Visit: Payer: Medicare Other

## 2019-01-30 ENCOUNTER — Other Ambulatory Visit: Payer: Self-pay

## 2019-01-30 DIAGNOSIS — M545 Low back pain, unspecified: Secondary | ICD-10-CM

## 2019-01-30 DIAGNOSIS — Z8781 Personal history of (healed) traumatic fracture: Secondary | ICD-10-CM | POA: Insufficient documentation

## 2019-01-30 DIAGNOSIS — G8929 Other chronic pain: Secondary | ICD-10-CM | POA: Insufficient documentation

## 2019-01-30 NOTE — Progress Notes (Signed)
Reviewed MRI and recent DEXA results with Dr. Estanislado Pandy. MRI revealed chronic L1 compression fracture. No acute fractures noted.  DEXA was WNL-T-score -1.0.  Due to her history of a vertebral fracture we recommend rechecking DEXA in 2 years rather than 5 years.  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 She was referred to Dr. Tollie Pizza, so please send a copy of the MRI results.

## 2019-02-05 DIAGNOSIS — Z1231 Encounter for screening mammogram for malignant neoplasm of breast: Secondary | ICD-10-CM | POA: Diagnosis not present

## 2019-02-05 DIAGNOSIS — M4316 Spondylolisthesis, lumbar region: Secondary | ICD-10-CM | POA: Diagnosis not present

## 2019-02-05 DIAGNOSIS — Z6828 Body mass index (BMI) 28.0-28.9, adult: Secondary | ICD-10-CM | POA: Diagnosis not present

## 2019-02-05 DIAGNOSIS — N952 Postmenopausal atrophic vaginitis: Secondary | ICD-10-CM | POA: Diagnosis not present

## 2019-02-05 DIAGNOSIS — Z124 Encounter for screening for malignant neoplasm of cervix: Secondary | ICD-10-CM | POA: Diagnosis not present

## 2019-02-06 DIAGNOSIS — M5416 Radiculopathy, lumbar region: Secondary | ICD-10-CM | POA: Diagnosis not present

## 2019-02-06 DIAGNOSIS — M5116 Intervertebral disc disorders with radiculopathy, lumbar region: Secondary | ICD-10-CM | POA: Diagnosis not present

## 2019-02-07 ENCOUNTER — Ambulatory Visit: Payer: Medicare Other | Attending: Internal Medicine

## 2019-02-07 DIAGNOSIS — Z23 Encounter for immunization: Secondary | ICD-10-CM | POA: Insufficient documentation

## 2019-02-07 NOTE — Progress Notes (Signed)
   Covid-19 Vaccination Clinic  Name:  Brittney Stewart    MRN: [id number] DOB: 10-07-1940  02/07/2019  Ms. [name] was observed post Covid-19 immunization for 15 minutes without incidence. She was provided with Vaccine Information Sheet and instruction to access the V-Safe system.   Ms. [name] was instructed to call 911 with any severe reactions post vaccine: Marland Kitchen Difficulty breathing  . Swelling of your face and throat  . A fast heartbeat  . A bad rash all over your body  . Dizziness and weakness    Immunizations Administered    Name Date Dose VIS Date Route   Pfizer COVID-19 Vaccine 02/07/2019 12:23 PM 0.3 mL 12/13/2018 Intramuscular   Manufacturer: [place]   Lot: [id number]   [place]: [id number]

## 2019-02-10 ENCOUNTER — Telehealth: Payer: Self-pay | Admitting: Rheumatology

## 2019-02-10 NOTE — Telephone Encounter (Signed)
FYI:  Patient called to let Dr. Estanislado Pandy know that she had a lumbar injection with Dr. Ellene Route last week and she is feeling better.

## 2019-02-13 ENCOUNTER — Ambulatory Visit: Payer: Medicare Other | Admitting: Rheumatology

## 2019-02-17 DIAGNOSIS — R7303 Prediabetes: Secondary | ICD-10-CM | POA: Diagnosis not present

## 2019-02-17 DIAGNOSIS — E785 Hyperlipidemia, unspecified: Secondary | ICD-10-CM | POA: Diagnosis not present

## 2019-02-17 DIAGNOSIS — I10 Essential (primary) hypertension: Secondary | ICD-10-CM | POA: Diagnosis not present

## 2019-02-17 DIAGNOSIS — M1990 Unspecified osteoarthritis, unspecified site: Secondary | ICD-10-CM | POA: Diagnosis not present

## 2019-02-17 DIAGNOSIS — K219 Gastro-esophageal reflux disease without esophagitis: Secondary | ICD-10-CM | POA: Diagnosis not present

## 2019-02-17 DIAGNOSIS — E559 Vitamin D deficiency, unspecified: Secondary | ICD-10-CM | POA: Diagnosis not present

## 2019-02-25 DIAGNOSIS — Z13820 Encounter for screening for osteoporosis: Secondary | ICD-10-CM | POA: Diagnosis not present

## 2019-02-25 DIAGNOSIS — Z23 Encounter for immunization: Secondary | ICD-10-CM | POA: Diagnosis not present

## 2019-02-25 DIAGNOSIS — Z1211 Encounter for screening for malignant neoplasm of colon: Secondary | ICD-10-CM | POA: Diagnosis not present

## 2019-02-25 DIAGNOSIS — Z0001 Encounter for general adult medical examination with abnormal findings: Secondary | ICD-10-CM | POA: Diagnosis not present

## 2019-02-25 DIAGNOSIS — E559 Vitamin D deficiency, unspecified: Secondary | ICD-10-CM | POA: Diagnosis not present

## 2019-02-25 DIAGNOSIS — Z1239 Encounter for other screening for malignant neoplasm of breast: Secondary | ICD-10-CM | POA: Diagnosis not present

## 2019-02-25 DIAGNOSIS — R001 Bradycardia, unspecified: Secondary | ICD-10-CM | POA: Diagnosis not present

## 2019-02-25 DIAGNOSIS — Z131 Encounter for screening for diabetes mellitus: Secondary | ICD-10-CM | POA: Diagnosis not present

## 2019-02-25 DIAGNOSIS — I10 Essential (primary) hypertension: Secondary | ICD-10-CM | POA: Diagnosis not present

## 2019-02-25 DIAGNOSIS — R5383 Other fatigue: Secondary | ICD-10-CM | POA: Diagnosis not present

## 2019-02-25 DIAGNOSIS — M1990 Unspecified osteoarthritis, unspecified site: Secondary | ICD-10-CM | POA: Diagnosis not present

## 2019-02-25 DIAGNOSIS — Z733 Stress, not elsewhere classified: Secondary | ICD-10-CM | POA: Diagnosis not present

## 2019-02-25 DIAGNOSIS — J309 Allergic rhinitis, unspecified: Secondary | ICD-10-CM | POA: Diagnosis not present

## 2019-02-25 DIAGNOSIS — E782 Mixed hyperlipidemia: Secondary | ICD-10-CM | POA: Diagnosis not present

## 2019-02-25 DIAGNOSIS — K30 Functional dyspepsia: Secondary | ICD-10-CM | POA: Diagnosis not present

## 2019-03-10 DIAGNOSIS — Z23 Encounter for immunization: Secondary | ICD-10-CM | POA: Diagnosis not present

## 2019-04-11 DIAGNOSIS — L84 Corns and callosities: Secondary | ICD-10-CM | POA: Diagnosis not present

## 2019-04-11 DIAGNOSIS — Z23 Encounter for immunization: Secondary | ICD-10-CM | POA: Diagnosis not present

## 2019-04-11 DIAGNOSIS — L989 Disorder of the skin and subcutaneous tissue, unspecified: Secondary | ICD-10-CM | POA: Diagnosis not present

## 2019-04-11 DIAGNOSIS — J309 Allergic rhinitis, unspecified: Secondary | ICD-10-CM | POA: Diagnosis not present

## 2019-04-11 DIAGNOSIS — R42 Dizziness and giddiness: Secondary | ICD-10-CM | POA: Diagnosis not present

## 2019-04-11 DIAGNOSIS — I10 Essential (primary) hypertension: Secondary | ICD-10-CM | POA: Diagnosis not present

## 2019-04-11 DIAGNOSIS — H9193 Unspecified hearing loss, bilateral: Secondary | ICD-10-CM | POA: Diagnosis not present

## 2019-05-07 DIAGNOSIS — S39012D Strain of muscle, fascia and tendon of lower back, subsequent encounter: Secondary | ICD-10-CM | POA: Diagnosis not present

## 2019-05-07 DIAGNOSIS — M545 Low back pain: Secondary | ICD-10-CM | POA: Diagnosis not present

## 2019-05-07 DIAGNOSIS — M6281 Muscle weakness (generalized): Secondary | ICD-10-CM | POA: Diagnosis not present

## 2019-05-20 ENCOUNTER — Other Ambulatory Visit: Payer: Self-pay

## 2019-05-20 ENCOUNTER — Ambulatory Visit (INDEPENDENT_AMBULATORY_CARE_PROVIDER_SITE_OTHER): Payer: Medicare Other | Admitting: Dermatology

## 2019-05-20 ENCOUNTER — Encounter: Payer: Self-pay | Admitting: Dermatology

## 2019-05-20 DIAGNOSIS — D225 Melanocytic nevi of trunk: Secondary | ICD-10-CM | POA: Diagnosis not present

## 2019-05-20 DIAGNOSIS — D485 Neoplasm of uncertain behavior of skin: Secondary | ICD-10-CM | POA: Diagnosis not present

## 2019-05-20 DIAGNOSIS — D229 Melanocytic nevi, unspecified: Secondary | ICD-10-CM

## 2019-05-20 DIAGNOSIS — L720 Epidermal cyst: Secondary | ICD-10-CM

## 2019-05-20 DIAGNOSIS — L820 Inflamed seborrheic keratosis: Secondary | ICD-10-CM | POA: Diagnosis not present

## 2019-05-20 NOTE — Patient Instructions (Addendum)
Biopsy, Surgery (Curettage) & Surgery (Excision) Aftercare Instructions  1. Okay to remove bandage in 24 hours  2. Wash area with soap and water  3. Apply Vaseline to area twice daily until healed (Not Neosporin)  4. Okay to cover with a Band-Aid to decrease the chance of infection or prevent irritation from clothing; also it's okay to uncover lesion at home.  5. Suture instructions: return to our office in 7-10 or 10-14 days for a nurse visit for suture removal. Variable healing with sutures, if pain or itching occurs call our office. It's okay to shower or bathe 24 hours after sutures are given.  6. The following risks may occur after a biopsy, curettage or excision: bleeding, scarring, discoloration, recurrence, infection (redness, yellow drainage, pain or swelling).  7. For questions, concerns and results call our office at [place] before 4pm & Friday before 3pm. Biopsy results will be available in 1 week.  Routine follow-up for Brittney Stewart date of birth 1940/05/31.  She notices enlargement and a lesion below her right rib cage which is clinically an irritated keratosis, rule out SCCA.  Simple shave biopsy done and there are no restrictions on local care.  Marlowe Kays can check on her home computer or call my office in 2 days to confirm the microscopic diagnosis.  There are noninflamed keratoses on the right upper arm and the right lower back which require no intervention.  She can" see" milia around her mouth with her fingertips but these are really quite invisible to other people.  She may choose to have these removed here in her med spa if she chooses in the future.  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  Otherwise from the waist up but she has perfectly healthy skin.  Annual skin check.

## 2019-05-21 DIAGNOSIS — M6281 Muscle weakness (generalized): Secondary | ICD-10-CM | POA: Diagnosis not present

## 2019-05-21 DIAGNOSIS — M545 Low back pain: Secondary | ICD-10-CM | POA: Diagnosis not present

## 2019-05-21 DIAGNOSIS — S39012D Strain of muscle, fascia and tendon of lower back, subsequent encounter: Secondary | ICD-10-CM | POA: Diagnosis not present

## 2019-05-25 NOTE — Progress Notes (Signed)
   Follow-Up Visit   Subjective  Brittney Stewart is a 79 y.o. female who presents for the following: Annual Exam (Here for yearly look over. Has a couple of places across her face and one under her right breast that looks different. No itching or bleeding.).  Growth Location: Right abdomen Duration: 6 months Quality: Increased size Associated Signs/Symptoms: Modifying Factors:  Severity:  Timing: Context: Worries patient  The following portions of the chart were reviewed this encounter and updated as appropriate:     Objective  Well appearing patient in no apparent distress; mood and affect are within normal limits.  All sun exposed skin plus back, abdomen, chest examined.  Assessment & Plan  Milia (2) Left Lower Cutaneous Lip; Right Lower Cutaneous Lip  Reassured that these do not show to other people, no intervention today  Neoplasm of uncertain behavior of skin Right Abdomen (side) - Upper  Skin / nail biopsy Type of biopsy: tangential   Informed consent: discussed and consent obtained   Timeout: patient name, date of birth, surgical site, and procedure verified   Procedure prep:  Patient was prepped and draped in usual sterile fashion Prep type:  Chlorhexidine Anesthesia: the lesion was anesthetized in a standard fashion   Anesthetic:  1% lidocaine w/ epinephrine 1-100,000 local infiltration Instrument used: flexible razor blade   Hemostasis achieved with: ferric subsulfate   Outcome: patient tolerated procedure well   Post-procedure details: wound care instructions given    Specimen 1 - Surgical pathology Differential Diagnosis: ISK Check Margins: No  Nevus Mid Back Routine follow-up for Brittney Stewart date of birth [birth year].  She notices enlargement and a lesion below her right rib cage which is clinically an irritated keratosis, rule out SCCA.  Simple shave biopsy done and there are no restrictions on local care.  Brittney Stewart can check on her home computer or  call my office in 2 days to confirm the microscopic diagnosis.  There are noninflamed keratoses on the right upper arm and the right lower back which require no intervention.  She can" see" milia around her mouth with her fingertips but these are really quite invisible to other people.  She may choose to have these removed here in her med spa if she chooses in the future.  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  Otherwise from the waist up but she has perfectly healthy skin.  Annual skin check.

## 2019-05-26 DIAGNOSIS — J309 Allergic rhinitis, unspecified: Secondary | ICD-10-CM | POA: Diagnosis not present

## 2019-05-26 DIAGNOSIS — L84 Corns and callosities: Secondary | ICD-10-CM | POA: Diagnosis not present

## 2019-05-26 DIAGNOSIS — Z733 Stress, not elsewhere classified: Secondary | ICD-10-CM | POA: Diagnosis not present

## 2019-05-26 DIAGNOSIS — I10 Essential (primary) hypertension: Secondary | ICD-10-CM | POA: Diagnosis not present

## 2019-05-26 DIAGNOSIS — M545 Low back pain: Secondary | ICD-10-CM | POA: Diagnosis not present

## 2019-05-26 DIAGNOSIS — Z23 Encounter for immunization: Secondary | ICD-10-CM | POA: Diagnosis not present

## 2019-05-27 DIAGNOSIS — M6281 Muscle weakness (generalized): Secondary | ICD-10-CM | POA: Diagnosis not present

## 2019-05-27 DIAGNOSIS — S39012D Strain of muscle, fascia and tendon of lower back, subsequent encounter: Secondary | ICD-10-CM | POA: Diagnosis not present

## 2019-05-27 DIAGNOSIS — M545 Low back pain: Secondary | ICD-10-CM | POA: Diagnosis not present

## 2019-06-04 DIAGNOSIS — S39012D Strain of muscle, fascia and tendon of lower back, subsequent encounter: Secondary | ICD-10-CM | POA: Diagnosis not present

## 2019-06-04 DIAGNOSIS — M6281 Muscle weakness (generalized): Secondary | ICD-10-CM | POA: Diagnosis not present

## 2019-06-04 DIAGNOSIS — M545 Low back pain: Secondary | ICD-10-CM | POA: Diagnosis not present

## 2019-06-16 NOTE — Progress Notes (Signed)
Office Visit Note  Patient: Brittney Stewart             Date of Birth: 10/18/1940           MRN: [id number]             PCP: Audley Hose, MD Referring: Audley Hose, MD Visit Date: 06/30/2019 Occupation: @GUAROCC @  Subjective:  Lower back pain.   History of Present Illness: Brittney Stewart is a 79 y.o. female with history of degenerative disc disease and osteoarthritis.  She states she continues to have lower back pain.  She went to physical therapy and has been doing exercises at home.  She also had epidural injection by Dr. Ellene Route which did not last for long time.  She has some stiffness in her cervical spine.  She is stiffness in her hands but she denies any joint swelling.  Activities of Daily Living:  Patient reports morning stiffness for  30  minutes.   Patient Reports nocturnal pain.  Difficulty dressing/grooming: Denies Difficulty climbing stairs: Reports Difficulty getting out of chair: Denies Difficulty using hands for taps, buttons, cutlery, and/or writing: Denies  Review of Systems  Constitutional: Positive for fatigue.  HENT: Positive for nose dryness. Negative for mouth sores and mouth dryness.   Eyes: Negative for itching and dryness.  Respiratory: Negative for shortness of breath and difficulty breathing.   Cardiovascular: Negative for chest pain and palpitations.  Gastrointestinal: Negative for blood in stool, constipation and diarrhea.  Endocrine: Negative for increased urination.  Genitourinary: Positive for nocturia. Negative for difficulty urinating.  Musculoskeletal: Positive for arthralgias, joint pain, myalgias, morning stiffness, muscle tenderness and myalgias. Negative for joint swelling.  Skin: Negative for color change, rash and redness.  Allergic/Immunologic: Negative for susceptible to infections.  Neurological: Negative for dizziness, numbness, headaches, memory loss and weakness.  Hematological: Negative for bruising/bleeding  tendency.  Psychiatric/Behavioral: Negative for confusion.    PMFS History:  Patient Active Problem List   Diagnosis Date Noted   DDD (degenerative disc disease), cervical 09/21/2017   DDD (degenerative disc disease), lumbar 09/21/2017   Inflamed sebaceous cyst 12/04/2011   Hypertension 12/04/2011   GERD (gastroesophageal reflux disease) 12/04/2011   Allergic rhinitis 12/04/2011   Hyperlipidemia 12/04/2011    Past Medical History:  Diagnosis Date   Allergic rhinitis, cause unspecified    Benign heart murmur    GERD (gastroesophageal reflux disease)    Hypertension    Inflamed sebaceous cyst    Insomnia, unspecified    Urine incontinence    Vitamin D deficiency     Family History  Problem Relation Age of Onset   Lung cancer Mother    Arthritis Mother    Hypertension Mother    Hypertension Father    Stroke Father    Diabetes Father    Diabetes Sister    Hypertension Sister    Hypertension Son    Past Surgical History:  Procedure Laterality Date   APPENDECTOMY     BACK SURGERY     CERVICAL SPINE SURGERY     COLONOSCOPY  10/11   dexa scan  02/2010   DIAGNOSTIC MAMMOGRAM  04/2010   PELVIC FRACTURE SURGERY  1968   SHOULDER SURGERY     complete right, partial left   TUMOR REMOVAL     Social History   Social History Narrative   Not on file   Immunization History  Administered Date(s) Administered   Influenza-Unspecified 01/02/2018   PFIZER SARS-COV-2 Vaccination 01/17/2019, 02/07/2019  Objective: Vital Signs: BP 117/73 (BP Location: Right Arm, Patient Position: Sitting, Cuff Size: Normal)    Pulse 64    Resp 14    Ht 5' 6.75" (1.695 m)    Wt 178 lb 9.6 oz (81 kg)    BMI 28.18 kg/m    Physical Exam Vitals and nursing note reviewed.  Constitutional:      Appearance: She is well-developed.  HENT:     Head: Normocephalic and atraumatic.  Eyes:     Conjunctiva/sclera: Conjunctivae normal.  Cardiovascular:     Rate and  Rhythm: Normal rate and regular rhythm.     Heart sounds: Normal heart sounds.  Pulmonary:     Effort: Pulmonary effort is normal.     Breath sounds: Normal breath sounds.  Abdominal:     General: Bowel sounds are normal.     Palpations: Abdomen is soft.  Musculoskeletal:     Cervical back: Normal range of motion.  Lymphadenopathy:     Cervical: No cervical adenopathy.  Skin:    General: Skin is warm and dry.     Capillary Refill: Capillary refill takes less than 2 seconds.  Neurological:     Mental Status: She is alert and oriented to person, place, and time.  Psychiatric:        Behavior: Behavior normal.      Musculoskeletal Exam: She has limited range of motion for cervical and lumbar spine.  Shoulder joints, elbow joints with good range of motion.  She had bilateral CMC PIP and DIP thickening.  Hip joints, knee joints were in good range of motion with no synovitis.  CDAI Exam: CDAI Score: -- Patient Global: --; Provider Global: -- Swollen: --; Tender: -- Joint Exam 06/30/2019   No joint exam has been documented for this visit   There is currently no information documented on the homunculus. Go to the Rheumatology activity and complete the homunculus joint exam.  Investigation: No additional findings.  Imaging: No results found.  Recent Labs: Lab Results  Component Value Date   WBC 4.6 07/02/2016   HGB 13.6 07/02/2016   PLT 147 (L) 07/02/2016   NA 137 07/02/2016   K 4.8 07/02/2016   CL 102 07/02/2016   CO2 28 07/02/2016   GLUCOSE 98 07/02/2016   BUN 18 07/02/2016   CREATININE 0.79 07/02/2016   BILITOT 1.2 02/03/2008   ALKPHOS 69 02/03/2008   AST 23 02/03/2008   ALT 15 02/03/2008   PROT 7.3 02/03/2008   ALBUMIN 3.7 02/03/2008   CALCIUM 9.6 07/02/2016   GFRAA >60 07/02/2016    Speciality Comments: No specialty comments available.  Procedures:  No procedures performed Allergies: Penicillin v potassium   Assessment / Plan:     Visit Diagnoses:  Primary osteoarthritis of both hands -  RF+.  She had no synovitis in her hands.  Rheumatoid factor positive  Dupuytren's contracture of right hand - Unchanged.   H/O total shoulder replacement, right-doing well.  History of left shoulder replacement-doing well  Pes planus of both feet-she has changed her shoes which are helpful.  DDD (degenerative disc disease), cervical-she has some limitation.  DDD (degenerative disc disease), lumbar - Multilevel spondylosis was noted.  L4-L5 significant spondylolisthesis was noted.  L1 and L2 compression fractures are noted.  Facet joint arthropathy was noted.  She has tried epidural injection which lasted for short time.  She has been going to physical therapy and has been doing exercises which has been helpful.  Some of the  exercises were demonstrated in the office today.  Water aerobics and swimming was also encouraged.  Weight loss was discussed.  History of vertebral fracture - T9, L1, L2 due to prior injury.  History of hyperlipidemia  History of hypertension  History of gastroesophageal reflux (GERD)  History of seasonal allergies  Orders: No orders of the defined types were placed in this encounter.  No orders of the defined types were placed in this encounter.     Follow-Up Instructions: Return in about 6 months (around 12/30/2019) for OA,DDD.   Bo Merino, MD  Note - This record has been created using Editor, commissioning.  Chart creation errors have been sought, but may not always  have been located. Such creation errors do not reflect on  the standard of medical care.

## 2019-06-30 ENCOUNTER — Other Ambulatory Visit: Payer: Self-pay

## 2019-06-30 ENCOUNTER — Encounter: Payer: Self-pay | Admitting: Rheumatology

## 2019-06-30 ENCOUNTER — Ambulatory Visit (INDEPENDENT_AMBULATORY_CARE_PROVIDER_SITE_OTHER): Payer: Medicare Other | Admitting: Rheumatology

## 2019-06-30 VITALS — BP 117/73 | HR 64 | Resp 14 | Ht 66.75 in | Wt 178.6 lb

## 2019-06-30 DIAGNOSIS — R768 Other specified abnormal immunological findings in serum: Secondary | ICD-10-CM | POA: Diagnosis not present

## 2019-06-30 DIAGNOSIS — M19042 Primary osteoarthritis, left hand: Secondary | ICD-10-CM

## 2019-06-30 DIAGNOSIS — Z889 Allergy status to unspecified drugs, medicaments and biological substances status: Secondary | ICD-10-CM

## 2019-06-30 DIAGNOSIS — M19041 Primary osteoarthritis, right hand: Secondary | ICD-10-CM | POA: Diagnosis not present

## 2019-06-30 DIAGNOSIS — M5136 Other intervertebral disc degeneration, lumbar region: Secondary | ICD-10-CM

## 2019-06-30 DIAGNOSIS — M2142 Flat foot [pes planus] (acquired), left foot: Secondary | ICD-10-CM

## 2019-06-30 DIAGNOSIS — Z8639 Personal history of other endocrine, nutritional and metabolic disease: Secondary | ICD-10-CM

## 2019-06-30 DIAGNOSIS — Z96611 Presence of right artificial shoulder joint: Secondary | ICD-10-CM

## 2019-06-30 DIAGNOSIS — M720 Palmar fascial fibromatosis [Dupuytren]: Secondary | ICD-10-CM | POA: Diagnosis not present

## 2019-06-30 DIAGNOSIS — Z8719 Personal history of other diseases of the digestive system: Secondary | ICD-10-CM

## 2019-06-30 DIAGNOSIS — M5030 Other cervical disc degeneration, unspecified cervical region: Secondary | ICD-10-CM

## 2019-06-30 DIAGNOSIS — Z8781 Personal history of (healed) traumatic fracture: Secondary | ICD-10-CM

## 2019-06-30 DIAGNOSIS — M51369 Other intervertebral disc degeneration, lumbar region without mention of lumbar back pain or lower extremity pain: Secondary | ICD-10-CM

## 2019-06-30 DIAGNOSIS — Z96612 Presence of left artificial shoulder joint: Secondary | ICD-10-CM

## 2019-06-30 DIAGNOSIS — M2141 Flat foot [pes planus] (acquired), right foot: Secondary | ICD-10-CM

## 2019-06-30 DIAGNOSIS — Z8679 Personal history of other diseases of the circulatory system: Secondary | ICD-10-CM

## 2019-07-10 DIAGNOSIS — M545 Low back pain: Secondary | ICD-10-CM | POA: Diagnosis not present

## 2019-07-10 DIAGNOSIS — M6281 Muscle weakness (generalized): Secondary | ICD-10-CM | POA: Diagnosis not present

## 2019-07-10 DIAGNOSIS — S39012D Strain of muscle, fascia and tendon of lower back, subsequent encounter: Secondary | ICD-10-CM | POA: Diagnosis not present

## 2019-07-16 DIAGNOSIS — S39012D Strain of muscle, fascia and tendon of lower back, subsequent encounter: Secondary | ICD-10-CM | POA: Diagnosis not present

## 2019-07-16 DIAGNOSIS — M545 Low back pain: Secondary | ICD-10-CM | POA: Diagnosis not present

## 2019-07-16 DIAGNOSIS — M6281 Muscle weakness (generalized): Secondary | ICD-10-CM | POA: Diagnosis not present

## 2019-07-23 DIAGNOSIS — M545 Low back pain: Secondary | ICD-10-CM | POA: Diagnosis not present

## 2019-07-23 DIAGNOSIS — M6281 Muscle weakness (generalized): Secondary | ICD-10-CM | POA: Diagnosis not present

## 2019-07-23 DIAGNOSIS — S39012D Strain of muscle, fascia and tendon of lower back, subsequent encounter: Secondary | ICD-10-CM | POA: Diagnosis not present

## 2019-07-29 DIAGNOSIS — M47896 Other spondylosis, lumbar region: Secondary | ICD-10-CM | POA: Diagnosis not present

## 2019-07-29 DIAGNOSIS — M545 Low back pain: Secondary | ICD-10-CM | POA: Diagnosis not present

## 2019-08-25 DIAGNOSIS — E559 Vitamin D deficiency, unspecified: Secondary | ICD-10-CM | POA: Diagnosis not present

## 2019-08-25 DIAGNOSIS — M170 Bilateral primary osteoarthritis of knee: Secondary | ICD-10-CM | POA: Diagnosis not present

## 2019-08-25 DIAGNOSIS — R7303 Prediabetes: Secondary | ICD-10-CM | POA: Diagnosis not present

## 2019-08-25 DIAGNOSIS — K219 Gastro-esophageal reflux disease without esophagitis: Secondary | ICD-10-CM | POA: Diagnosis not present

## 2019-08-25 DIAGNOSIS — E782 Mixed hyperlipidemia: Secondary | ICD-10-CM | POA: Diagnosis not present

## 2019-08-25 DIAGNOSIS — I10 Essential (primary) hypertension: Secondary | ICD-10-CM | POA: Diagnosis not present

## 2019-08-25 DIAGNOSIS — M545 Low back pain: Secondary | ICD-10-CM | POA: Diagnosis not present

## 2019-08-28 DIAGNOSIS — E559 Vitamin D deficiency, unspecified: Secondary | ICD-10-CM | POA: Diagnosis not present

## 2019-08-28 DIAGNOSIS — I10 Essential (primary) hypertension: Secondary | ICD-10-CM | POA: Diagnosis not present

## 2019-08-28 DIAGNOSIS — E782 Mixed hyperlipidemia: Secondary | ICD-10-CM | POA: Diagnosis not present

## 2019-08-28 DIAGNOSIS — R7303 Prediabetes: Secondary | ICD-10-CM | POA: Diagnosis not present

## 2019-08-29 DIAGNOSIS — M4316 Spondylolisthesis, lumbar region: Secondary | ICD-10-CM | POA: Diagnosis not present

## 2019-10-10 DIAGNOSIS — Z23 Encounter for immunization: Secondary | ICD-10-CM | POA: Diagnosis not present

## 2019-10-27 DIAGNOSIS — R7303 Prediabetes: Secondary | ICD-10-CM | POA: Diagnosis not present

## 2019-10-27 DIAGNOSIS — E785 Hyperlipidemia, unspecified: Secondary | ICD-10-CM | POA: Diagnosis not present

## 2019-10-27 DIAGNOSIS — I10 Essential (primary) hypertension: Secondary | ICD-10-CM | POA: Diagnosis not present

## 2019-10-30 DIAGNOSIS — Z23 Encounter for immunization: Secondary | ICD-10-CM | POA: Diagnosis not present

## 2019-11-03 DIAGNOSIS — Z961 Presence of intraocular lens: Secondary | ICD-10-CM | POA: Diagnosis not present

## 2019-11-03 DIAGNOSIS — H353131 Nonexudative age-related macular degeneration, bilateral, early dry stage: Secondary | ICD-10-CM | POA: Diagnosis not present

## 2019-12-01 DIAGNOSIS — E782 Mixed hyperlipidemia: Secondary | ICD-10-CM | POA: Diagnosis not present

## 2019-12-01 DIAGNOSIS — I10 Essential (primary) hypertension: Secondary | ICD-10-CM | POA: Diagnosis not present

## 2019-12-01 DIAGNOSIS — M545 Low back pain, unspecified: Secondary | ICD-10-CM | POA: Diagnosis not present

## 2019-12-01 DIAGNOSIS — Z733 Stress, not elsewhere classified: Secondary | ICD-10-CM | POA: Diagnosis not present

## 2019-12-01 DIAGNOSIS — R350 Frequency of micturition: Secondary | ICD-10-CM | POA: Diagnosis not present

## 2019-12-01 DIAGNOSIS — R011 Cardiac murmur, unspecified: Secondary | ICD-10-CM | POA: Diagnosis not present

## 2019-12-01 DIAGNOSIS — M48061 Spinal stenosis, lumbar region without neurogenic claudication: Secondary | ICD-10-CM | POA: Diagnosis not present

## 2019-12-02 ENCOUNTER — Other Ambulatory Visit (HOSPITAL_COMMUNITY): Payer: Self-pay | Admitting: Internal Medicine

## 2019-12-02 DIAGNOSIS — R011 Cardiac murmur, unspecified: Secondary | ICD-10-CM

## 2019-12-04 ENCOUNTER — Other Ambulatory Visit: Payer: Self-pay

## 2019-12-04 ENCOUNTER — Ambulatory Visit (HOSPITAL_COMMUNITY)
Admission: RE | Admit: 2019-12-04 | Discharge: 2019-12-04 | Disposition: A | Discharge status: Home / Self Care | Payer: Medicare Other | Source: Ambulatory Visit | Attending: Internal Medicine | Admitting: Internal Medicine

## 2019-12-04 DIAGNOSIS — I10 Essential (primary) hypertension: Secondary | ICD-10-CM | POA: Insufficient documentation

## 2019-12-04 DIAGNOSIS — K219 Gastro-esophageal reflux disease without esophagitis: Secondary | ICD-10-CM | POA: Diagnosis not present

## 2019-12-04 DIAGNOSIS — R011 Cardiac murmur, unspecified: Secondary | ICD-10-CM | POA: Diagnosis not present

## 2019-12-04 NOTE — Progress Notes (Signed)
  Echocardiogram 2D Echocardiogram with 3D has been performed.  Darlina Sicilian M 12/04/2019, 3:45 PM

## 2019-12-06 LAB — ECHOCARDIOGRAM COMPLETE
Area-P 1/2: 2.84 cm2
S' Lateral: 2.3 cm

## 2019-12-08 NOTE — Progress Notes (Signed)
Office Visit Note  Patient: Brittney Stewart             Date of Birth: January 31, 1940           MRN: [id number]             PCP: Audley Hose, MD Referring: Audley Hose, MD Visit Date: 12/22/2019 Occupation: @GUAROCC @  Subjective:  Pain in hands and lower back.   History of Present Illness: Brittney Stewart is a 79 y.o. female with history of osteoarthritis and degenerative disc disease.  She states she continues to have some pain and stiffness in her hands.  She has discomfort over CMC joints.  She has been experiencing some discomfort in the thoracic region in her lumbar region.  She denies any joint swelling.  Activities of Daily Living:  Patient reports morning stiffness for 30 minutes.   Patient Reports nocturnal pain.  Difficulty dressing/grooming: Denies Difficulty climbing stairs: Reports Difficulty getting out of chair: Denies Difficulty using hands for taps, buttons, cutlery, and/or writing: Reports  Review of Systems  Constitutional: Negative for fatigue.  HENT: Negative for mouth sores, mouth dryness and nose dryness.   Eyes: Negative for pain, itching and dryness.  Respiratory: Negative for shortness of breath and difficulty breathing.   Cardiovascular: Negative for chest pain and palpitations.  Gastrointestinal: Negative for blood in stool, constipation and diarrhea.  Endocrine: Negative for increased urination.  Genitourinary: Positive for nocturia. Negative for difficulty urinating.  Musculoskeletal: Positive for arthralgias, joint pain, myalgias, morning stiffness and myalgias. Negative for joint swelling and muscle tenderness.  Skin: Negative for color change, rash and redness.  Allergic/Immunologic: Negative for susceptible to infections.  Neurological: Positive for numbness. Negative for dizziness, headaches, memory loss and weakness.  Hematological: Positive for bruising/bleeding tendency.  Psychiatric/Behavioral: Negative for confusion.    PMFS  History:  Patient Active Problem List   Diagnosis Date Noted  . DDD (degenerative disc disease), cervical 09/21/2017  . DDD (degenerative disc disease), lumbar 09/21/2017  . Inflamed sebaceous cyst 12/04/2011  . Hypertension 12/04/2011  . GERD (gastroesophageal reflux disease) 12/04/2011  . Allergic rhinitis 12/04/2011  . Hyperlipidemia 12/04/2011    Past Medical History:  Diagnosis Date  . Allergic rhinitis, cause unspecified   . Benign heart murmur   . GERD (gastroesophageal reflux disease)   . Hypertension   . Inflamed sebaceous cyst   . Insomnia, unspecified   . Urine incontinence   . Vitamin D deficiency     Family History  Problem Relation Age of Onset  . Lung cancer Mother   . Arthritis Mother   . Hypertension Mother   . Hypertension Father   . Stroke Father   . Diabetes Father   . Diabetes Sister   . Hypertension Sister   . Hypertension Son    Past Surgical History:  Procedure Laterality Date  . APPENDECTOMY    . BACK SURGERY    . CERVICAL SPINE SURGERY    . COLONOSCOPY  10/11  . dexa scan  02/2010  . DIAGNOSTIC MAMMOGRAM  04/2010  . PELVIC FRACTURE SURGERY  1968  . SHOULDER SURGERY     complete right, partial left  . TUMOR REMOVAL     Social History   Social History Narrative  . Not on file   Immunization History  Administered Date(s) Administered  . Influenza-Unspecified 01/02/2018  . PFIZER SARS-COV-2 Vaccination 01/17/2019, 02/07/2019, 10/30/2019     Objective: Vital Signs: BP 136/83 (BP Location: Left Arm, Patient Position: Sitting, Cuff  Size: Normal)   Pulse 60   Resp 14   Ht 5' 6.75" (1.695 m)   Wt 174 lb 3.2 oz (79 kg)   BMI 27.49 kg/m    Physical Exam Vitals and nursing note reviewed.  Constitutional:      Appearance: She is well-developed and well-nourished.  HENT:     Head: Normocephalic and atraumatic.  Eyes:     Extraocular Movements: EOM normal.     Conjunctiva/sclera: Conjunctivae normal.  Cardiovascular:     Rate and  Rhythm: Normal rate and regular rhythm.     Pulses: Intact distal pulses.     Heart sounds: Normal heart sounds.  Pulmonary:     Effort: Pulmonary effort is normal.     Breath sounds: Normal breath sounds.  Abdominal:     General: Bowel sounds are normal.     Palpations: Abdomen is soft.  Musculoskeletal:     Cervical back: Normal range of motion.  Lymphadenopathy:     Cervical: No cervical adenopathy.  Skin:    General: Skin is warm and dry.     Capillary Refill: Capillary refill takes less than 2 seconds.  Neurological:     Mental Status: She is alert and oriented to person, place, and time.  Psychiatric:        Mood and Affect: Mood and affect normal.        Behavior: Behavior normal.      Musculoskeletal Exam: C-spine was in good range of motion.  She had bilateral trapezius spasm.  She had no tenderness over the thoracic region.  She had some discomfort in the right paravertebral region of the lower back.  Shoulder joints had good range of motion with some discomfort.  Elbow joints with good range of motion.  She had CMC subluxation on the right with bilateral CMC prominence.  PIP and DIP thickening with no synovitis was noted.  Hip joints and knee joints with good range of motion.  She had no ankle joint or MTP tenderness.  CDAI Exam: CDAI Score: -- Patient Global: --; Provider Global: -- Swollen: --; Tender: -- Joint Exam 12/22/2019   No joint exam has been documented for this visit   There is currently no information documented on the homunculus. Go to the Rheumatology activity and complete the homunculus joint exam.  Investigation: No additional findings.  Imaging: ECHOCARDIOGRAM COMPLETE  Result Date: 12/06/2019    ECHOCARDIOGRAM REPORT   Patient Name:   Brittney Stewart Date of Exam: 12/04/2019 Medical Rec #:  [id number]       Height:       66.7 in Accession #:    [id number]      Weight:       178.6 lb Date of Birth:  1940-08-28        BSA:          1.922 m Patient  Age:    79 years        BP:           117/73 mmHg Patient Gender: F               HR:           65 bpm. Exam Location:  Inpatient Procedure: 2D Echo, 3D Echo, Color Doppler and Cardiac Doppler Indications:    Cardiac murmur [[id number]]  History:        Patient has no prior history of Echocardiogram examinations.  Risk Factors:Hypertension. GERD.  Sonographer:    Darlina Sicilian RDCS Referring Phys: [place]  1. Left ventricular ejection fraction, by estimation, is 65 to 70%. The left ventricle has normal function. The left ventricle has no regional wall motion abnormalities. There is moderate concentric left ventricular hypertrophy. Left ventricular diastolic parameters are consistent with Grade I diastolic dysfunction (impaired relaxation). Elevated left atrial pressure. The average left ventricular global longitudinal strain is -19.8 %. The global longitudinal strain is normal.  2. Right ventricular systolic function is normal. The right ventricular size is normal. There is normal pulmonary artery systolic pressure. The estimated right ventricular systolic pressure is 44.8 mmHg.  3. The mitral valve is normal in structure. Trivial mitral valve regurgitation. No evidence of mitral stenosis.  4. The aortic valve is normal in structure. There is mild calcification of the aortic valve. There is mild thickening of the aortic valve. Aortic valve regurgitation is not visualized. Mild to moderate aortic valve sclerosis/calcification is present, without any evidence of aortic stenosis.  5. The inferior vena cava is normal in size with greater than 50% respiratory variability, suggesting right atrial pressure of 3 mmHg. FINDINGS  Left Ventricle: Left ventricular ejection fraction, by estimation, is 65 to 70%. The left ventricle has normal function. The left ventricle has no regional wall motion abnormalities. The average left ventricular global longitudinal strain is -19.8 %. The  global longitudinal strain is normal. The left ventricular internal cavity size was normal in size. There is moderate concentric left ventricular hypertrophy. Left ventricular diastolic parameters are consistent with Grade I diastolic dysfunction (impaired relaxation). Elevated left atrial pressure. Right Ventricle: The right ventricular size is normal. No increase in right ventricular wall thickness. Right ventricular systolic function is normal. There is normal pulmonary artery systolic pressure. The tricuspid regurgitant velocity is 2.59 m/s, and  with an assumed right atrial pressure of 3 mmHg, the estimated right ventricular systolic pressure is 18.5 mmHg. Left Atrium: Left atrial size was normal in size. Right Atrium: Right atrial size was normal in size. Pericardium: There is no evidence of pericardial effusion. Mitral Valve: The mitral valve is normal in structure. Trivial mitral valve regurgitation. No evidence of mitral valve stenosis. Tricuspid Valve: The tricuspid valve is normal in structure. Tricuspid valve regurgitation is mild . No evidence of tricuspid stenosis. Aortic Valve: The aortic valve is normal in structure. There is mild calcification of the aortic valve. There is mild thickening of the aortic valve. Aortic valve regurgitation is not visualized. Mild to moderate aortic valve sclerosis/calcification is present, without any evidence of aortic stenosis. Pulmonic Valve: The pulmonic valve was normal in structure. Pulmonic valve regurgitation is not visualized. No evidence of pulmonic stenosis. Aorta: The aortic root is normal in size and structure. Venous: The inferior vena cava is normal in size with greater than 50% respiratory variability, suggesting right atrial pressure of 3 mmHg. IAS/Shunts: No atrial level shunt detected by color flow Doppler.  LEFT VENTRICLE PLAX 2D LVIDd:         3.60 cm  Diastology LVIDs:         2.30 cm  LV e' medial:    6.42 cm/s LV PW:         1.20 cm  LV E/e'  medial:  11.6 LV IVS:        1.20 cm  LV e' lateral:   7.18 cm/s LVOT diam:     1.90 cm  LV E/e' lateral: 10.4 LV SV:  64 LV SV Index:   33       2D Longitudinal Strain LVOT Area:     2.84 cm 2D Strain GLS Avg:     -19.8 %                          3D Volume EF:                         3D EF:        65 %                         LV EDV:       114 ml                         LV ESV:       40 ml                         LV SV:        74 ml RIGHT VENTRICLE RV S prime:     12.50 cm/s TAPSE (M-mode): 2.2 cm LEFT ATRIUM             Index       RIGHT ATRIUM           Index LA diam:        3.80 cm 1.98 cm/m  RA Area:     13.40 cm LA Vol (A2C):   34.0 ml 17.69 ml/m RA Volume:   28.40 ml  14.78 ml/m LA Vol (A4C):   47.6 ml 24.77 ml/m LA Biplane Vol: 42.0 ml 21.86 ml/m  AORTIC VALVE LVOT Vmax:   110.00 cm/s LVOT Vmean:  67.700 cm/s LVOT VTI:    0.225 m  AORTA Ao Root diam: 3.30 cm Ao Asc diam:  3.30 cm MITRAL VALVE               TRICUSPID VALVE MV Area (PHT): 2.84 cm    TR Peak grad:   26.8 mmHg MV Decel Time: 268 msec    TR Vmax:        259.00 cm/s MV E velocity: 74.40 cm/s MV A velocity: 95.15 cm/s  SHUNTS MV E/A ratio:  0.78        Systemic VTI:  0.22 m                            Systemic Diam: 1.90 cm Ena Dawley MD Electronically signed by Ena Dawley MD Signature Date/Time: 12/06/2019/9:35:28 AM    Final     Recent Labs: Lab Results  Component Value Date   WBC 4.6 07/02/2016   HGB 13.6 07/02/2016   PLT 147 (L) 07/02/2016   NA 137 07/02/2016   K 4.8 07/02/2016   CL 102 07/02/2016   CO2 28 07/02/2016   GLUCOSE 98 07/02/2016   BUN 18 07/02/2016   CREATININE 0.79 07/02/2016   BILITOT 1.2 02/03/2008   ALKPHOS 69 02/03/2008   AST 23 02/03/2008   ALT 15 02/03/2008   PROT 7.3 02/03/2008   ALBUMIN 3.7 02/03/2008   CALCIUM 9.6 07/02/2016   GFRAA >60 07/02/2016    Speciality Comments: No specialty comments available.  Procedures:  No procedures performed Allergies: Penicillin v  potassium   Assessment / Plan:  Visit Diagnoses: Primary osteoarthritis of both hands - RF+.  She continues to have pain and discomfort in her bilateral hands.  She has bilateral CMC subluxation.  She has PIP and DIP thickening.  No synovitis was noted.  A handout on hand muscle strengthening exercise was given.  Rheumatoid factor positive-she has no synovitis on examination.  Dupuytren's contracture of right hand - Unchanged.  It is not bothersome to her.  H/O total shoulder replacement, right-she has some limitation with range of motion.  History of left shoulder replacement-mild limitation range of motion was noted.  She has some discomfort in her left shoulder joint.  Pes planus of both feet-proper fitting shoes were discussed.  DDD (degenerative disc disease), cervical-she had good range of motion of the cervical region.  DDD (degenerative disc disease), lumbar - Multilevel spondylosis was noted.  L4-L5 significant spondylolisthesis was noted.  L1 and L2 compression fractures are noted.  Facet joint arthropathy was noted.  A handout on back exercises was given.  This has been also demonstrated in the office.  He is also followed by Dr. Ellene Route.  History of vertebral fracture - T9, L1, L2 due to prior injury.  History of hypertension-blood pressure is well controlled.  History of hyperlipidemia  History of gastroesophageal reflux (GERD)  History of seasonal allergies  Osteoporosis screening -reviewed DXA 01/23/2019 T score -1.0.  She is fully vaccinated against COVID-19.  Use of mask, social distancing and hand hygiene was discussed.  Orders: No orders of the defined types were placed in this encounter.  No orders of the defined types were placed in this encounter.    Follow-Up Instructions: Return in about 6 months (around 06/21/2020) for Osteoarthritis.   Bo Merino, MD  Note - This record has been created using Editor, commissioning.  Chart creation errors have  been sought, but may not always  have been located. Such creation errors do not reflect on  the standard of medical care.

## 2019-12-22 ENCOUNTER — Other Ambulatory Visit: Payer: Self-pay

## 2019-12-22 ENCOUNTER — Ambulatory Visit (INDEPENDENT_AMBULATORY_CARE_PROVIDER_SITE_OTHER): Payer: Medicare Other | Admitting: Rheumatology

## 2019-12-22 ENCOUNTER — Encounter: Payer: Self-pay | Admitting: Rheumatology

## 2019-12-22 VITALS — BP 136/83 | HR 60 | Resp 14 | Ht 66.75 in | Wt 174.2 lb

## 2019-12-22 DIAGNOSIS — Z8639 Personal history of other endocrine, nutritional and metabolic disease: Secondary | ICD-10-CM | POA: Diagnosis not present

## 2019-12-22 DIAGNOSIS — Z96611 Presence of right artificial shoulder joint: Secondary | ICD-10-CM

## 2019-12-22 DIAGNOSIS — Z20822 Contact with and (suspected) exposure to covid-19: Secondary | ICD-10-CM | POA: Diagnosis not present

## 2019-12-22 DIAGNOSIS — Z889 Allergy status to unspecified drugs, medicaments and biological substances status: Secondary | ICD-10-CM

## 2019-12-22 DIAGNOSIS — Z8679 Personal history of other diseases of the circulatory system: Secondary | ICD-10-CM

## 2019-12-22 DIAGNOSIS — Z96612 Presence of left artificial shoulder joint: Secondary | ICD-10-CM | POA: Diagnosis not present

## 2019-12-22 DIAGNOSIS — Z8719 Personal history of other diseases of the digestive system: Secondary | ICD-10-CM | POA: Diagnosis not present

## 2019-12-22 DIAGNOSIS — R768 Other specified abnormal immunological findings in serum: Secondary | ICD-10-CM

## 2019-12-22 DIAGNOSIS — M720 Palmar fascial fibromatosis [Dupuytren]: Secondary | ICD-10-CM

## 2019-12-22 DIAGNOSIS — Z8781 Personal history of (healed) traumatic fracture: Secondary | ICD-10-CM | POA: Diagnosis not present

## 2019-12-22 DIAGNOSIS — M5030 Other cervical disc degeneration, unspecified cervical region: Secondary | ICD-10-CM

## 2019-12-22 DIAGNOSIS — M2142 Flat foot [pes planus] (acquired), left foot: Secondary | ICD-10-CM

## 2019-12-22 DIAGNOSIS — M2141 Flat foot [pes planus] (acquired), right foot: Secondary | ICD-10-CM | POA: Diagnosis not present

## 2019-12-22 DIAGNOSIS — M19042 Primary osteoarthritis, left hand: Secondary | ICD-10-CM

## 2019-12-22 DIAGNOSIS — M5136 Other intervertebral disc degeneration, lumbar region: Secondary | ICD-10-CM

## 2019-12-22 DIAGNOSIS — M19041 Primary osteoarthritis, right hand: Secondary | ICD-10-CM

## 2019-12-22 DIAGNOSIS — Z13820 Encounter for screening for osteoporosis: Secondary | ICD-10-CM

## 2019-12-22 NOTE — Patient Instructions (Addendum)
Hand Exercises Hand exercises can be helpful for almost anyone. These exercises can strengthen the hands, improve flexibility and movement, and increase blood flow to the hands. These results can make work and daily tasks easier. Hand exercises can be especially helpful for people who have joint pain from arthritis or have nerve damage from overuse (carpal tunnel syndrome). These exercises can also help people who have injured a hand. Exercises Most of these hand exercises are gentle stretching and motion exercises. It is usually safe to do them often throughout the day. Warming up your hands before exercise may help to reduce stiffness. You can do this with gentle massage or by placing your hands in warm water for 10-15 minutes. It is normal to feel some stretching, pulling, tightness, or mild discomfort as you begin new exercises. This will gradually improve. Stop an exercise right away if you feel sudden, severe pain or your pain gets worse. Ask your health care provider which exercises are best for you. Knuckle bend or "claw" fist 1. Stand or sit with your arm, hand, and all five fingers pointed straight up. Make sure to keep your wrist straight during the exercise. 2. Gently bend your fingers down toward your palm until the tips of your fingers are touching the top of your palm. Keep your big knuckle straight and just bend the small knuckles in your fingers. 3. Hold this position for __________ seconds. 4. Straighten (extend) your fingers back to the starting position. Repeat this exercise 5-10 times with each hand. Full finger fist 1. Stand or sit with your arm, hand, and all five fingers pointed straight up. Make sure to keep your wrist straight during the exercise. 2. Gently bend your fingers into your palm until the tips of your fingers are touching the middle of your palm. 3. Hold this position for __________ seconds. 4. Extend your fingers back to the starting position, stretching every  joint fully. Repeat this exercise 5-10 times with each hand. Straight fist 1. Stand or sit with your arm, hand, and all five fingers pointed straight up. Make sure to keep your wrist straight during the exercise. 2. Gently bend your fingers at the big knuckle, where your fingers meet your hand, and the middle knuckle. Keep the knuckle at the tips of your fingers straight and try to touch the bottom of your palm. 3. Hold this position for __________ seconds. 4. Extend your fingers back to the starting position, stretching every joint fully. Repeat this exercise 5-10 times with each hand. Tabletop 1. Stand or sit with your arm, hand, and all five fingers pointed straight up. Make sure to keep your wrist straight during the exercise. 2. Gently bend your fingers at the big knuckle, where your fingers meet your hand, as far down as you can while keeping the small knuckles in your fingers straight. Think of forming a tabletop with your fingers. 3. Hold this position for __________ seconds. 4. Extend your fingers back to the starting position, stretching every joint fully. Repeat this exercise 5-10 times with each hand. Finger spread 1. Place your hand flat on a table with your palm facing down. Make sure your wrist stays straight as you do this exercise. 2. Spread your fingers and thumb apart from each other as far as you can until you feel a gentle stretch. Hold this position for __________ seconds. 3. Bring your fingers and thumb tight together again. Hold this position for __________ seconds. Repeat this exercise 5-10 times with each hand.   Making circles 1. Stand or sit with your arm, hand, and all five fingers pointed straight up. Make sure to keep your wrist straight during the exercise. 2. Make a circle by touching the tip of your thumb to the tip of your index finger. 3. Hold for __________ seconds. Then open your hand wide. 4. Repeat this motion with your thumb and each finger on your  hand. Repeat this exercise 5-10 times with each hand. Thumb motion 1. Sit with your forearm resting on a table and your wrist straight. Your thumb should be facing up toward the ceiling. Keep your fingers relaxed as you move your thumb. 2. Lift your thumb up as high as you can toward the ceiling. Hold for __________ seconds. 3. Bend your thumb across your palm as far as you can, reaching the tip of your thumb for the small finger (pinkie) side of your palm. Hold for __________ seconds. Repeat this exercise 5-10 times with each hand. Grip strengthening  1. Hold a stress ball or other soft ball in the middle of your hand. 2. Slowly increase the pressure, squeezing the ball as much as you can without causing pain. Think of bringing the tips of your fingers into the middle of your palm. All of your finger joints should bend when doing this exercise. 3. Hold your squeeze for __________ seconds, then relax. Repeat this exercise 5-10 times with each hand. Contact a health care provider if:  Your hand pain or discomfort gets much worse when you do an exercise.  Your hand pain or discomfort does not improve within 2 hours after you exercise. If you have any of these problems, stop doing these exercises right away. Do not do them again unless your health care provider says that you can. Get help right away if:  You develop sudden, severe hand pain or swelling. If this happens, stop doing these exercises right away. Do not do them again unless your health care provider says that you can. This information is not intended to replace advice given to you by your health care provider. Make sure you discuss any questions you have with your health care provider. Document Revised: 04/11/2018 Document Reviewed: 12/20/2017 Elsevier Patient Education  2020 [place]. Back Exercises The following exercises strengthen the muscles that help to support the trunk and back. They also help to keep the lower back  flexible. Doing these exercises can help to prevent back pain or lessen existing pain.  If you have back pain or discomfort, try doing these exercises 2-3 times each day or as told by your health care provider.  As your pain improves, do them once each day, but increase the number of times that you repeat the steps for each exercise (do more repetitions).  To prevent the recurrence of back pain, continue to do these exercises once each day or as told by your health care provider. Do exercises exactly as told by your health care provider and adjust them as directed. It is normal to feel mild stretching, pulling, tightness, or discomfort as you do these exercises, but you should stop right away if you feel sudden pain or your pain gets worse. Exercises Single knee to chest Repeat these steps 3-5 times for each leg: 1. Lie on your back on a firm bed or the floor with your legs extended. 2. Bring one knee to your chest. Your other leg should stay extended and in contact with the floor. 3. Hold your knee in place by grabbing  your knee or thigh with both hands and hold. 4. Pull on your knee until you feel a gentle stretch in your lower back or buttocks. 5. Hold the stretch for 10-30 seconds. 6. Slowly release and straighten your leg. Pelvic tilt Repeat these steps 5-10 times: 1. Lie on your back on a firm bed or the floor with your legs extended. 2. Bend your knees so they are pointing toward the ceiling and your feet are flat on the floor. 3. Tighten your lower abdominal muscles to press your lower back against the floor. This motion will tilt your pelvis so your tailbone points up toward the ceiling instead of pointing to your feet or the floor. 4. With gentle tension and even breathing, hold this position for 5-10 seconds. Cat-cow Repeat these steps until your lower back becomes more flexible: 1. Get into a hands-and-knees position on a firm surface. Keep your hands under your shoulders, and  keep your knees under your hips. You may place padding under your knees for comfort. 2. Let your head hang down toward your chest. Contract your abdominal muscles and point your tailbone toward the floor so your lower back becomes rounded like the back of a cat. 3. Hold this position for 5 seconds. 4. Slowly lift your head, let your abdominal muscles relax and point your tailbone up toward the ceiling so your back forms a sagging arch like the back of a cow. 5. Hold this position for 5 seconds.  Press-ups Repeat these steps 5-10 times: 1. Lie on your abdomen (face-down) on the floor. 2. Place your palms near your head, about shoulder-width apart. 3. Keeping your back as relaxed as possible and keeping your hips on the floor, slowly straighten your arms to raise the top half of your body and lift your shoulders. Do not use your back muscles to raise your upper torso. You may adjust the placement of your hands to make yourself more comfortable. 4. Hold this position for 5 seconds while you keep your back relaxed. 5. Slowly return to lying flat on the floor.  Bridges Repeat these steps 10 times: 1. Lie on your back on a firm surface. 2. Bend your knees so they are pointing toward the ceiling and your feet are flat on the floor. Your arms should be flat at your sides, next to your body. 3. Tighten your buttocks muscles and lift your buttocks off the floor until your waist is at almost the same height as your knees. You should feel the muscles working in your buttocks and the back of your thighs. If you do not feel these muscles, slide your feet 1-2 inches farther away from your buttocks. 4. Hold this position for 3-5 seconds. 5. Slowly lower your hips to the starting position, and allow your buttocks muscles to relax completely. If this exercise is too easy, try doing it with your arms crossed over your chest. Abdominal crunches Repeat these steps 5-10 times: 1. Lie on your back on a firm bed or  the floor with your legs extended. 2. Bend your knees so they are pointing toward the ceiling and your feet are flat on the floor. 3. Cross your arms over your chest. 4. Tip your chin slightly toward your chest without bending your neck. 5. Tighten your abdominal muscles and slowly raise your trunk (torso) high enough to lift your shoulder blades a tiny bit off the floor. Avoid raising your torso higher than that because it can put too much stress on  your low back and does not help to strengthen your abdominal muscles. 6. Slowly return to your starting position. Back lifts Repeat these steps 5-10 times: 1. Lie on your abdomen (face-down) with your arms at your sides, and rest your forehead on the floor. 2. Tighten the muscles in your legs and your buttocks. 3. Slowly lift your chest off the floor while you keep your hips pressed to the floor. Keep the back of your head in line with the curve in your back. Your eyes should be looking at the floor. 4. Hold this position for 3-5 seconds. 5. Slowly return to your starting position. Contact a health care provider if:  Your back pain or discomfort gets much worse when you do an exercise.  Your worsening back pain or discomfort does not lessen within 2 hours after you exercise. If you have any of these problems, stop doing these exercises right away. Do not do them again unless your health care provider says that you can. Get help right away if:  You develop sudden, severe back pain. If this happens, stop doing the exercises right away. Do not do them again unless your health care provider says that you can. This information is not intended to replace advice given to you by your health care provider. Make sure you discuss any questions you have with your health care provider. Document Revised: 04/25/2018 Document Reviewed: 09/20/2017 Elsevier Patient Education  [place].

## 2020-02-05 DIAGNOSIS — K219 Gastro-esophageal reflux disease without esophagitis: Secondary | ICD-10-CM | POA: Diagnosis not present

## 2020-02-05 DIAGNOSIS — Z1211 Encounter for screening for malignant neoplasm of colon: Secondary | ICD-10-CM | POA: Diagnosis not present

## 2020-02-05 DIAGNOSIS — K5730 Diverticulosis of large intestine without perforation or abscess without bleeding: Secondary | ICD-10-CM | POA: Diagnosis not present

## 2020-02-05 DIAGNOSIS — Z86010 Personal history of colonic polyps: Secondary | ICD-10-CM | POA: Diagnosis not present

## 2020-03-01 DIAGNOSIS — E785 Hyperlipidemia, unspecified: Secondary | ICD-10-CM | POA: Diagnosis not present

## 2020-03-01 DIAGNOSIS — R7303 Prediabetes: Secondary | ICD-10-CM | POA: Diagnosis not present

## 2020-03-01 DIAGNOSIS — I10 Essential (primary) hypertension: Secondary | ICD-10-CM | POA: Diagnosis not present

## 2020-03-01 DIAGNOSIS — E559 Vitamin D deficiency, unspecified: Secondary | ICD-10-CM | POA: Diagnosis not present

## 2020-03-01 DIAGNOSIS — M1990 Unspecified osteoarthritis, unspecified site: Secondary | ICD-10-CM | POA: Diagnosis not present

## 2020-03-10 DIAGNOSIS — I10 Essential (primary) hypertension: Secondary | ICD-10-CM | POA: Diagnosis not present

## 2020-03-10 DIAGNOSIS — R7303 Prediabetes: Secondary | ICD-10-CM | POA: Diagnosis not present

## 2020-03-10 DIAGNOSIS — E559 Vitamin D deficiency, unspecified: Secondary | ICD-10-CM | POA: Diagnosis not present

## 2020-03-10 DIAGNOSIS — N3941 Urge incontinence: Secondary | ICD-10-CM | POA: Diagnosis not present

## 2020-03-10 DIAGNOSIS — M1990 Unspecified osteoarthritis, unspecified site: Secondary | ICD-10-CM | POA: Diagnosis not present

## 2020-03-10 DIAGNOSIS — E782 Mixed hyperlipidemia: Secondary | ICD-10-CM | POA: Diagnosis not present

## 2020-03-10 DIAGNOSIS — R0609 Other forms of dyspnea: Secondary | ICD-10-CM | POA: Diagnosis not present

## 2020-03-10 DIAGNOSIS — I700 Atherosclerosis of aorta: Secondary | ICD-10-CM | POA: Diagnosis not present

## 2020-03-11 DIAGNOSIS — R7303 Prediabetes: Secondary | ICD-10-CM | POA: Diagnosis not present

## 2020-03-11 DIAGNOSIS — E559 Vitamin D deficiency, unspecified: Secondary | ICD-10-CM | POA: Diagnosis not present

## 2020-03-11 DIAGNOSIS — E782 Mixed hyperlipidemia: Secondary | ICD-10-CM | POA: Diagnosis not present

## 2020-03-11 DIAGNOSIS — I10 Essential (primary) hypertension: Secondary | ICD-10-CM | POA: Diagnosis not present

## 2020-03-15 DIAGNOSIS — K219 Gastro-esophageal reflux disease without esophagitis: Secondary | ICD-10-CM | POA: Diagnosis not present

## 2020-03-15 DIAGNOSIS — I5030 Unspecified diastolic (congestive) heart failure: Secondary | ICD-10-CM | POA: Diagnosis not present

## 2020-03-15 DIAGNOSIS — R0789 Other chest pain: Secondary | ICD-10-CM | POA: Diagnosis not present

## 2020-03-15 DIAGNOSIS — R7303 Prediabetes: Secondary | ICD-10-CM | POA: Diagnosis not present

## 2020-03-15 DIAGNOSIS — I10 Essential (primary) hypertension: Secondary | ICD-10-CM | POA: Diagnosis not present

## 2020-03-15 DIAGNOSIS — E785 Hyperlipidemia, unspecified: Secondary | ICD-10-CM | POA: Diagnosis not present

## 2020-03-15 DIAGNOSIS — E559 Vitamin D deficiency, unspecified: Secondary | ICD-10-CM | POA: Diagnosis not present

## 2020-03-22 DIAGNOSIS — Z1211 Encounter for screening for malignant neoplasm of colon: Secondary | ICD-10-CM | POA: Diagnosis not present

## 2020-03-22 DIAGNOSIS — Z86010 Personal history of colonic polyps: Secondary | ICD-10-CM | POA: Diagnosis not present

## 2020-03-22 DIAGNOSIS — K5730 Diverticulosis of large intestine without perforation or abscess without bleeding: Secondary | ICD-10-CM | POA: Diagnosis not present

## 2020-04-30 DIAGNOSIS — H903 Sensorineural hearing loss, bilateral: Secondary | ICD-10-CM | POA: Diagnosis not present

## 2020-05-04 DIAGNOSIS — N952 Postmenopausal atrophic vaginitis: Secondary | ICD-10-CM | POA: Diagnosis not present

## 2020-05-04 DIAGNOSIS — Z01419 Encounter for gynecological examination (general) (routine) without abnormal findings: Secondary | ICD-10-CM | POA: Diagnosis not present

## 2020-05-04 DIAGNOSIS — N3281 Overactive bladder: Secondary | ICD-10-CM | POA: Diagnosis not present

## 2020-05-04 DIAGNOSIS — Z6828 Body mass index (BMI) 28.0-28.9, adult: Secondary | ICD-10-CM | POA: Diagnosis not present

## 2020-05-04 DIAGNOSIS — Z1231 Encounter for screening mammogram for malignant neoplasm of breast: Secondary | ICD-10-CM | POA: Diagnosis not present

## 2020-05-12 DIAGNOSIS — H353221 Exudative age-related macular degeneration, left eye, with active choroidal neovascularization: Secondary | ICD-10-CM | POA: Diagnosis not present

## 2020-05-13 DIAGNOSIS — H353112 Nonexudative age-related macular degeneration, right eye, intermediate dry stage: Secondary | ICD-10-CM | POA: Diagnosis not present

## 2020-05-13 DIAGNOSIS — H353221 Exudative age-related macular degeneration, left eye, with active choroidal neovascularization: Secondary | ICD-10-CM | POA: Diagnosis not present

## 2020-05-18 ENCOUNTER — Encounter: Payer: Self-pay | Admitting: Dermatology

## 2020-05-18 ENCOUNTER — Other Ambulatory Visit: Payer: Self-pay

## 2020-05-18 ENCOUNTER — Ambulatory Visit (INDEPENDENT_AMBULATORY_CARE_PROVIDER_SITE_OTHER): Payer: Medicare Other | Admitting: Dermatology

## 2020-05-18 DIAGNOSIS — L720 Epidermal cyst: Secondary | ICD-10-CM

## 2020-05-18 DIAGNOSIS — Z1283 Encounter for screening for malignant neoplasm of skin: Secondary | ICD-10-CM

## 2020-05-18 DIAGNOSIS — K6289 Other specified diseases of anus and rectum: Secondary | ICD-10-CM | POA: Diagnosis not present

## 2020-05-18 DIAGNOSIS — L729 Follicular cyst of the skin and subcutaneous tissue, unspecified: Secondary | ICD-10-CM | POA: Diagnosis not present

## 2020-05-18 DIAGNOSIS — D239 Other benign neoplasm of skin, unspecified: Secondary | ICD-10-CM

## 2020-05-18 DIAGNOSIS — L851 Acquired keratosis [keratoderma] palmaris et plantaris: Secondary | ICD-10-CM | POA: Diagnosis not present

## 2020-05-18 DIAGNOSIS — D2339 Other benign neoplasm of skin of other parts of face: Secondary | ICD-10-CM

## 2020-05-18 DIAGNOSIS — L821 Other seborrheic keratosis: Secondary | ICD-10-CM

## 2020-05-27 DIAGNOSIS — Z23 Encounter for immunization: Secondary | ICD-10-CM | POA: Diagnosis not present

## 2020-05-31 ENCOUNTER — Encounter: Payer: Self-pay | Admitting: Dermatology

## 2020-06-01 NOTE — Progress Notes (Signed)
   Follow-Up Visit   Subjective  Brittney Stewart is a 80 y.o. female who presents for the following: Annual Exam (Rectal lesion was a bump like noticed by ob and ).  General skin examination, multiple skin issues, OB/GYN noticed a lesion near her rectum. Location:  Duration:  Quality:  Associated Signs/Symptoms: Modifying Factors:  Severity:  Timing: Context:   Objective  Well appearing patient in no apparent distress; mood and affect are within normal limits. Objective  Head to Toe: No atypical pigmented lesions or nonmelanoma skin cancer on general skin examination.  Objective  Mid Back, Right Upper Arm - Posterior: Brown textured 5 mm noninflamed flattopped papules  Objective  Left Malar Cheek, Right Malar Cheek: 1 x 2 mm dermal flesh-colored to slightly right nonumbilicated papules, historically stable.  Objective  Gluteal Crease: Noninflamed 1cm cyst, cannot entirely rule out pilonidal also if patient desires removal will see general surgeon.  Objective  Left Malar Cheek: Half millimeter white superficial dermal papules  Objective  Left Lower Leg - Posterior, Right Thigh - Posterior: tan subtly textured flat 6 mm macules  Objective  Left Shoulder - Anterior: 8 mm noninflamed epidermoid cyst    A full examination was performed including scalp, head, eyes, ears, nose, lips, neck, chest, axillae, abdomen, back, buttocks, bilateral upper extremities, bilateral lower extremities, hands, feet, fingers, toes, fingernails, and toenails. All findings within normal limits unless otherwise noted below.  All areas under undergarments not fully examined.   Assessment & Plan    Skin exam for malignant neoplasm Head to Toe  Yearly skin check  Seborrheic keratosis (2) Right Upper Arm - Posterior; Mid Back  Benign okay to leave if stable.  Syringoma (2) Left Malar Cheek; Right Malar Cheek  Benign okay to leave.  Perianal cyst Gluteal Crease  Refer to general  surgeon  Milia Left Malar Cheek  Benign okay to leave.  Stucco keratosis (2) Right Thigh - Posterior; Left Lower Leg - Posterior  No intervention currently necessary  Cyst of skin Left Shoulder - Anterior  Patient may elect to schedule 30 minutes for excision.      I, Lavonna Monarch, MD, have reviewed all documentation for this visit.  The documentation on 06/01/20 for the exam, diagnosis, procedures, and orders are all accurate and complete.

## 2020-06-10 DIAGNOSIS — H353112 Nonexudative age-related macular degeneration, right eye, intermediate dry stage: Secondary | ICD-10-CM | POA: Diagnosis not present

## 2020-06-10 DIAGNOSIS — H353221 Exudative age-related macular degeneration, left eye, with active choroidal neovascularization: Secondary | ICD-10-CM | POA: Diagnosis not present

## 2020-06-14 ENCOUNTER — Ambulatory Visit: Payer: Medicare Other | Admitting: Rheumatology

## 2020-06-25 ENCOUNTER — Other Ambulatory Visit: Payer: Self-pay | Admitting: Internal Medicine

## 2020-06-25 DIAGNOSIS — Z0001 Encounter for general adult medical examination with abnormal findings: Secondary | ICD-10-CM | POA: Diagnosis not present

## 2020-06-25 DIAGNOSIS — R7303 Prediabetes: Secondary | ICD-10-CM | POA: Diagnosis not present

## 2020-06-25 DIAGNOSIS — E559 Vitamin D deficiency, unspecified: Secondary | ICD-10-CM | POA: Diagnosis not present

## 2020-06-25 DIAGNOSIS — E782 Mixed hyperlipidemia: Secondary | ICD-10-CM | POA: Diagnosis not present

## 2020-06-25 DIAGNOSIS — I700 Atherosclerosis of aorta: Secondary | ICD-10-CM | POA: Diagnosis not present

## 2020-06-25 DIAGNOSIS — R0989 Other specified symptoms and signs involving the circulatory and respiratory systems: Secondary | ICD-10-CM

## 2020-06-25 DIAGNOSIS — I10 Essential (primary) hypertension: Secondary | ICD-10-CM | POA: Diagnosis not present

## 2020-06-25 DIAGNOSIS — Z23 Encounter for immunization: Secondary | ICD-10-CM | POA: Diagnosis not present

## 2020-06-25 DIAGNOSIS — H3530 Unspecified macular degeneration: Secondary | ICD-10-CM | POA: Diagnosis not present

## 2020-06-25 DIAGNOSIS — E663 Overweight: Secondary | ICD-10-CM | POA: Diagnosis not present

## 2020-06-25 DIAGNOSIS — E785 Hyperlipidemia, unspecified: Secondary | ICD-10-CM | POA: Diagnosis not present

## 2020-06-25 DIAGNOSIS — Z1211 Encounter for screening for malignant neoplasm of colon: Secondary | ICD-10-CM | POA: Diagnosis not present

## 2020-06-25 DIAGNOSIS — Z13820 Encounter for screening for osteoporosis: Secondary | ICD-10-CM | POA: Diagnosis not present

## 2020-06-25 DIAGNOSIS — Z1239 Encounter for other screening for malignant neoplasm of breast: Secondary | ICD-10-CM | POA: Diagnosis not present

## 2020-06-29 NOTE — Progress Notes (Signed)
Office Visit Note  Patient: Brittney Stewart             Date of Birth: February 07, 1940           MRN: [id number]             PCP: Audley Hose, MD Referring: Audley Hose, MD Visit Date: 07/13/2020 Occupation: @GUAROCC @  Subjective:  Pain in multiple joints.   History of Present Illness: Brittney Stewart is a 80 y.o. female with a history of osteoarthritis, and degenerative disc disease.  She states she continues to have pain and stiffness in multiple joints.  Bilateral shoulder joints are replaced.  She states she continues to have some discomfort in the left shoulder joint.  She also has bilateral CMC discomfort.  She has knee joint discomfort.  She was seen by Dr. Ellene Route for neck and lower back pain.  Activities of Daily Living:  Patient reports morning stiffness for 1-60 minutes.   Patient Denies nocturnal pain.  Difficulty dressing/grooming: Denies Difficulty climbing stairs: Reports Difficulty getting out of chair: Reports Difficulty using hands for taps, buttons, cutlery, and/or writing: Reports  Review of Systems  Constitutional:  Negative for fatigue, night sweats, weight gain and weight loss.  HENT:  Negative for mouth sores, trouble swallowing, trouble swallowing, mouth dryness and nose dryness.   Eyes:  Positive for visual disturbance. Negative for pain, redness, itching and dryness.  Respiratory:  Negative for cough, hemoptysis, shortness of breath and difficulty breathing.   Cardiovascular:  Negative for chest pain, palpitations, hypertension, irregular heartbeat and swelling in legs/feet.  Gastrointestinal:  Negative for abdominal pain, blood in stool, constipation and diarrhea.  Endocrine: Negative for increased urination.  Genitourinary:  Negative for painful urination and vaginal dryness.  Musculoskeletal:  Positive for joint pain, joint pain and morning stiffness. Negative for joint swelling, myalgias, muscle weakness, muscle tenderness and myalgias.  Skin:   Negative for color change, rash, hair loss, redness, skin tightness, ulcers and sensitivity to sunlight.  Allergic/Immunologic: Negative for susceptible to infections.  Neurological:  Negative for dizziness, numbness, headaches, memory loss, night sweats and weakness.  Hematological:  Negative for swollen glands.  Psychiatric/Behavioral:  Negative for depressed mood, confusion and sleep disturbance. The patient is not nervous/anxious.    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   Urine incontinence    Vitamin D deficiency     Family History  Problem Relation Age of Onset   Lung cancer Mother    Arthritis Mother    Hypertension Mother    Hypertension Father    Stroke Father    Diabetes Father    Diabetes Sister    Hypertension Sister    Hypertension Son    Past Surgical History:  Procedure Laterality Date   APPENDECTOMY     BACK SURGERY     CERVICAL SPINE SURGERY     COLONOSCOPY  10/11  dexa scan  02/2010   DIAGNOSTIC MAMMOGRAM  04/2010   PELVIC FRACTURE SURGERY  1968   SHOULDER SURGERY     complete right, partial left   TUMOR REMOVAL     Social History   Social History Narrative   Not on file   Immunization History  Administered Date(s) Administered   Influenza-Unspecified 01/02/2018   PFIZER(Purple Top)SARS-COV-2 Vaccination 01/17/2019, 02/07/2019, 10/30/2019     Objective: Vital Signs: BP  118/75 (BP Location: Right Arm, Patient Position: Sitting, Cuff Size: Normal)   Pulse 62   Ht 5\' 6"  (1.676 m)   Wt 177 lb (80.3 kg)   BMI 28.57 kg/m    Physical Exam Vitals and nursing note reviewed.  Constitutional:      Appearance: She is well-developed.  HENT:     Head: Normocephalic and atraumatic.  Eyes:     Conjunctiva/sclera: Conjunctivae normal.  Cardiovascular:     Rate and Rhythm: Normal rate and regular rhythm.     Heart sounds: Normal heart sounds.  Pulmonary:     Effort: Pulmonary effort is normal.     Breath sounds: Normal breath sounds.  Abdominal:     General: Bowel sounds are normal.     Palpations: Abdomen is soft.  Musculoskeletal:     Cervical back: Normal range of motion.  Lymphadenopathy:     Cervical: No cervical adenopathy.  Skin:    General: Skin is warm and dry.     Capillary Refill: Capillary refill takes less than 2 seconds.  Neurological:     Mental Status: She is alert and oriented to person, place, and time.  Psychiatric:        Behavior: Behavior normal.     Musculoskeletal Exam: C-spine was in good range of motion.  She had discomfort range of motion of bilateral shoulder joints with some limitation with abduction and internal rotation.  Elbow joints and wrist joints with good range of motion.  She had bilateral CMC thickening.  PIP and DIP thickening was noted.  Hip joints with good range of motion.  She had good range of motion of bilateral knee joints.  There was no tenderness over ankles or MTPs.  CDAI Exam: CDAI Score: -- Patient Global: --; Provider Global: -- Swollen: --; Tender: -- Joint Exam 07/13/2020   No joint exam has been documented for this visit   There is currently no information documented on the homunculus. Go to the Rheumatology activity and complete the homunculus joint exam.  Investigation: No additional findings.  Imaging: No results found.  Recent Labs: Lab Results  Component Value Date   WBC 4.6  07/02/2016   HGB 13.6 07/02/2016   PLT 147 (L) 07/02/2016   NA 137 07/02/2016   K 4.8 07/02/2016   CL 102 07/02/2016   CO2 28 07/02/2016   GLUCOSE 98 07/02/2016   BUN 18 07/02/2016   CREATININE 0.79 07/02/2016   BILITOT 1.2 02/03/2008   ALKPHOS 69 02/03/2008   AST 23 02/03/2008   ALT 15 02/03/2008   PROT 7.3 02/03/2008   ALBUMIN 3.7 02/03/2008   CALCIUM 9.6 07/02/2016   GFRAA >60 07/02/2016    Speciality Comments: No specialty comments available.  Procedures:  No procedures performed Allergies: Penicillin v potassium   Assessment / Plan:     Visit Diagnoses: Primary osteoarthritis of both hands - RF+.  She has no synovitis on examination.  She had bilateral CMC arthritis and subluxation.  She uses CMC braces as needed.  Rheumatoid factor positive-there  is no synovitis on examination.  Dupuytren's contracture of right hand - Unchanged.  H/O total shoulder replacement, right-doing well.  History of left shoulder replacement-she continues to have some discomfort with range of motion.  She will need a total shoulder replacement in the future when she is ready.  Pes planus of both feet-she has been using her arch support in her shoes.  DDD (degenerative disc disease), cervical-she had good range of motion without much discomfort.  DDD (degenerative disc disease), lumbar - Multilevel spondylosis was noted.  L4-L5 significant spondylolisthesis was noted.   Facet joint arthropathy was noted.  She has seen Dr. Ellene Route.  I encouraged her to do core strengthening exercises.  History of vertebral fracture - T9, L1, L2 due to prior injury.  History of hypertension  History of hyperlipidemia  History of seasonal allergies  History of gastroesophageal reflux (GERD)  Osteoporosis screening - reviewed DXA 01/23/2019 T score -1.0.  Orders: No orders of the defined types were placed in this encounter.  No orders of the defined types were placed in this encounter.  Face-to-face  time spent patient was 30 minutes.  50% time was spent in counseling and coordination of care.  Follow-Up Instructions: Return for Osteoarthritis.   Bo Merino, MD  Note - This record has been created using Editor, commissioning.  Chart creation errors have been sought, but may not always  have been located. Such creation errors do not reflect on  the standard of medical care.

## 2020-07-12 DIAGNOSIS — I5030 Unspecified diastolic (congestive) heart failure: Secondary | ICD-10-CM | POA: Diagnosis not present

## 2020-07-12 DIAGNOSIS — R7303 Prediabetes: Secondary | ICD-10-CM | POA: Diagnosis not present

## 2020-07-12 DIAGNOSIS — E559 Vitamin D deficiency, unspecified: Secondary | ICD-10-CM | POA: Diagnosis not present

## 2020-07-12 DIAGNOSIS — I10 Essential (primary) hypertension: Secondary | ICD-10-CM | POA: Diagnosis not present

## 2020-07-12 DIAGNOSIS — H3530 Unspecified macular degeneration: Secondary | ICD-10-CM | POA: Diagnosis not present

## 2020-07-12 DIAGNOSIS — E785 Hyperlipidemia, unspecified: Secondary | ICD-10-CM | POA: Diagnosis not present

## 2020-07-13 ENCOUNTER — Other Ambulatory Visit: Payer: Self-pay

## 2020-07-13 ENCOUNTER — Ambulatory Visit (INDEPENDENT_AMBULATORY_CARE_PROVIDER_SITE_OTHER): Payer: Medicare Other | Admitting: Rheumatology

## 2020-07-13 ENCOUNTER — Encounter: Payer: Self-pay | Admitting: Rheumatology

## 2020-07-13 VITALS — BP 118/75 | HR 62 | Ht 66.0 in | Wt 177.0 lb

## 2020-07-13 DIAGNOSIS — Z96611 Presence of right artificial shoulder joint: Secondary | ICD-10-CM | POA: Diagnosis not present

## 2020-07-13 DIAGNOSIS — Z8781 Personal history of (healed) traumatic fracture: Secondary | ICD-10-CM | POA: Diagnosis not present

## 2020-07-13 DIAGNOSIS — Z96612 Presence of left artificial shoulder joint: Secondary | ICD-10-CM

## 2020-07-13 DIAGNOSIS — M2142 Flat foot [pes planus] (acquired), left foot: Secondary | ICD-10-CM

## 2020-07-13 DIAGNOSIS — Z8679 Personal history of other diseases of the circulatory system: Secondary | ICD-10-CM | POA: Diagnosis not present

## 2020-07-13 DIAGNOSIS — M720 Palmar fascial fibromatosis [Dupuytren]: Secondary | ICD-10-CM | POA: Diagnosis not present

## 2020-07-13 DIAGNOSIS — Z8719 Personal history of other diseases of the digestive system: Secondary | ICD-10-CM

## 2020-07-13 DIAGNOSIS — Z13820 Encounter for screening for osteoporosis: Secondary | ICD-10-CM

## 2020-07-13 DIAGNOSIS — R768 Other specified abnormal immunological findings in serum: Secondary | ICD-10-CM | POA: Diagnosis not present

## 2020-07-13 DIAGNOSIS — M51369 Other intervertebral disc degeneration, lumbar region without mention of lumbar back pain or lower extremity pain: Secondary | ICD-10-CM

## 2020-07-13 DIAGNOSIS — M5136 Other intervertebral disc degeneration, lumbar region: Secondary | ICD-10-CM

## 2020-07-13 DIAGNOSIS — Z889 Allergy status to unspecified drugs, medicaments and biological substances status: Secondary | ICD-10-CM

## 2020-07-13 DIAGNOSIS — Z8639 Personal history of other endocrine, nutritional and metabolic disease: Secondary | ICD-10-CM | POA: Diagnosis not present

## 2020-07-13 DIAGNOSIS — M5030 Other cervical disc degeneration, unspecified cervical region: Secondary | ICD-10-CM | POA: Diagnosis not present

## 2020-07-13 DIAGNOSIS — M19042 Primary osteoarthritis, left hand: Secondary | ICD-10-CM

## 2020-07-13 DIAGNOSIS — M2141 Flat foot [pes planus] (acquired), right foot: Secondary | ICD-10-CM | POA: Diagnosis not present

## 2020-07-13 DIAGNOSIS — M19041 Primary osteoarthritis, right hand: Secondary | ICD-10-CM | POA: Diagnosis not present

## 2020-07-15 ENCOUNTER — Ambulatory Visit
Admission: RE | Admit: 2020-07-15 | Discharge: 2020-07-15 | Disposition: A | Discharge status: Home / Self Care | Payer: Medicare Other | Source: Ambulatory Visit | Attending: Internal Medicine | Admitting: Internal Medicine

## 2020-07-15 DIAGNOSIS — R0989 Other specified symptoms and signs involving the circulatory and respiratory systems: Secondary | ICD-10-CM

## 2020-07-15 DIAGNOSIS — I6523 Occlusion and stenosis of bilateral carotid arteries: Secondary | ICD-10-CM | POA: Diagnosis not present

## 2020-07-22 DIAGNOSIS — H353112 Nonexudative age-related macular degeneration, right eye, intermediate dry stage: Secondary | ICD-10-CM | POA: Diagnosis not present

## 2020-07-22 DIAGNOSIS — H353221 Exudative age-related macular degeneration, left eye, with active choroidal neovascularization: Secondary | ICD-10-CM | POA: Diagnosis not present

## 2020-09-14 DIAGNOSIS — H353112 Nonexudative age-related macular degeneration, right eye, intermediate dry stage: Secondary | ICD-10-CM | POA: Diagnosis not present

## 2020-09-14 DIAGNOSIS — H353221 Exudative age-related macular degeneration, left eye, with active choroidal neovascularization: Secondary | ICD-10-CM | POA: Diagnosis not present

## 2020-09-22 DIAGNOSIS — H9193 Unspecified hearing loss, bilateral: Secondary | ICD-10-CM | POA: Diagnosis not present

## 2020-09-22 DIAGNOSIS — M791 Myalgia, unspecified site: Secondary | ICD-10-CM | POA: Diagnosis not present

## 2020-09-22 DIAGNOSIS — H3530 Unspecified macular degeneration: Secondary | ICD-10-CM | POA: Diagnosis not present

## 2020-09-22 DIAGNOSIS — I10 Essential (primary) hypertension: Secondary | ICD-10-CM | POA: Diagnosis not present

## 2020-09-22 DIAGNOSIS — I700 Atherosclerosis of aorta: Secondary | ICD-10-CM | POA: Diagnosis not present

## 2020-09-22 DIAGNOSIS — G4709 Other insomnia: Secondary | ICD-10-CM | POA: Diagnosis not present

## 2020-09-22 DIAGNOSIS — E785 Hyperlipidemia, unspecified: Secondary | ICD-10-CM | POA: Diagnosis not present

## 2020-09-23 DIAGNOSIS — Z23 Encounter for immunization: Secondary | ICD-10-CM | POA: Diagnosis not present

## 2020-09-24 DIAGNOSIS — E785 Hyperlipidemia, unspecified: Secondary | ICD-10-CM | POA: Diagnosis not present

## 2020-09-28 DIAGNOSIS — H0016 Chalazion left eye, unspecified eyelid: Secondary | ICD-10-CM | POA: Diagnosis not present

## 2020-11-10 DIAGNOSIS — H353122 Nonexudative age-related macular degeneration, left eye, intermediate dry stage: Secondary | ICD-10-CM | POA: Diagnosis not present

## 2020-11-10 DIAGNOSIS — Z961 Presence of intraocular lens: Secondary | ICD-10-CM | POA: Diagnosis not present

## 2020-11-15 DIAGNOSIS — E785 Hyperlipidemia, unspecified: Secondary | ICD-10-CM | POA: Diagnosis not present

## 2020-11-15 DIAGNOSIS — I10 Essential (primary) hypertension: Secondary | ICD-10-CM | POA: Diagnosis not present

## 2020-11-15 DIAGNOSIS — I5030 Unspecified diastolic (congestive) heart failure: Secondary | ICD-10-CM | POA: Diagnosis not present

## 2020-11-18 DIAGNOSIS — H353112 Nonexudative age-related macular degeneration, right eye, intermediate dry stage: Secondary | ICD-10-CM | POA: Diagnosis not present

## 2020-11-18 DIAGNOSIS — H353221 Exudative age-related macular degeneration, left eye, with active choroidal neovascularization: Secondary | ICD-10-CM | POA: Diagnosis not present

## 2020-11-18 DIAGNOSIS — H0016 Chalazion left eye, unspecified eyelid: Secondary | ICD-10-CM | POA: Diagnosis not present

## 2020-11-29 DIAGNOSIS — U071 COVID-19: Secondary | ICD-10-CM | POA: Diagnosis not present

## 2020-11-29 DIAGNOSIS — R059 Cough, unspecified: Secondary | ICD-10-CM | POA: Diagnosis not present

## 2020-12-28 NOTE — Progress Notes (Deleted)
Office Visit Note  Patient: Brittney Stewart             Date of Birth: 01-05-1940           MRN: [id number]             PCP: Audley Hose, MD Referring: Audley Hose, MD Visit Date: 01/11/2021 Occupation: @GUAROCC @  Subjective:  No chief complaint on file.   History of Present Illness: Brittney Stewart is a 80 y.o. female ***   Activities of Daily Living:  Patient reports morning stiffness for *** {minute/hour:19697}.   Patient {ACTIONS;DENIES/REPORTS:21021675::"Denies"} nocturnal pain.  Difficulty dressing/grooming: {ACTIONS;DENIES/REPORTS:21021675::"Denies"} Difficulty climbing stairs: {ACTIONS;DENIES/REPORTS:21021675::"Denies"} Difficulty getting out of chair: {ACTIONS;DENIES/REPORTS:21021675::"Denies"} Difficulty using hands for taps, buttons, cutlery, and/or writing: {ACTIONS;DENIES/REPORTS:21021675::"Denies"}  No Rheumatology ROS completed.   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   Urine incontinence    Vitamin D deficiency     Family History  Problem Relation Age of Onset   Lung cancer Mother    Arthritis Mother    Hypertension Mother    Hypertension Father    Stroke Father    Diabetes Father    Diabetes Sister    Hypertension Sister    Hypertension Son    Past Surgical History:  Procedure Laterality Date   APPENDECTOMY     BACK SURGERY     CERVICAL SPINE SURGERY      COLONOSCOPY  10/11   dexa scan  02/2010   DIAGNOSTIC MAMMOGRAM  04/2010   PELVIC FRACTURE SURGERY  1968   SHOULDER SURGERY     complete right, partial left   TUMOR REMOVAL     Social History   Social History Narrative   Not on file   Immunization History  Administered Date(s) Administered   Influenza-Unspecified 01/02/2018   PFIZER(Purple Top)SARS-COV-2 Vaccination 01/17/2019, 02/07/2019, 10/30/2019     Objective: Vital Signs: There were no vitals taken for this visit.   Physical Exam   Musculoskeletal Exam: ***  CDAI Exam: CDAI Score: -- Patient Global: --; Provider Global: -- Swollen: --; Tender: -- Joint Exam 01/11/2021   No joint exam has been documented for this visit   There is currently no information documented on the homunculus. Go to the Rheumatology activity and complete the homunculus joint exam.  Investigation: No additional findings.  Imaging: No results found.  Recent Labs: Lab Results  Component Value Date   WBC 4.6 07/02/2016   HGB 13.6 07/02/2016   PLT 147 (L) 07/02/2016   NA 137 07/02/2016   K 4.8 07/02/2016   CL 102 07/02/2016   CO2 28 07/02/2016   GLUCOSE 98 07/02/2016   BUN 18 07/02/2016   CREATININE 0.79 07/02/2016   BILITOT 1.2 02/03/2008   ALKPHOS 69 02/03/2008   AST 23 02/03/2008   ALT 15 02/03/2008   PROT 7.3 02/03/2008   ALBUMIN 3.7 02/03/2008   CALCIUM 9.6 07/02/2016  GFRAA >60 07/02/2016    Speciality Comments: No specialty comments available.  Procedures:  No procedures performed Allergies: Penicillin v potassium   Assessment / Plan:     Visit Diagnoses: No diagnosis found.  Orders: No orders of the defined types were placed in this encounter.  No orders of the defined types were placed in this encounter.   Face-to-face time spent with patient was *** minutes. Greater than 50% of time was spent in counseling and coordination of care.  Follow-Up Instructions: No follow-ups on file.   Earnestine Mealing,  CMA  Note - This record has been created using Editor, commissioning.  Chart creation errors have been sought, but may not always  have been located. Such creation errors do not reflect on  the standard of medical care.

## 2021-01-03 DIAGNOSIS — G2581 Restless legs syndrome: Secondary | ICD-10-CM | POA: Diagnosis not present

## 2021-01-03 DIAGNOSIS — J019 Acute sinusitis, unspecified: Secondary | ICD-10-CM | POA: Diagnosis not present

## 2021-01-10 DIAGNOSIS — E785 Hyperlipidemia, unspecified: Secondary | ICD-10-CM | POA: Diagnosis not present

## 2021-01-10 DIAGNOSIS — J019 Acute sinusitis, unspecified: Secondary | ICD-10-CM | POA: Diagnosis not present

## 2021-01-10 DIAGNOSIS — E559 Vitamin D deficiency, unspecified: Secondary | ICD-10-CM | POA: Diagnosis not present

## 2021-01-10 DIAGNOSIS — M069 Rheumatoid arthritis, unspecified: Secondary | ICD-10-CM | POA: Diagnosis not present

## 2021-01-10 DIAGNOSIS — I10 Essential (primary) hypertension: Secondary | ICD-10-CM | POA: Diagnosis not present

## 2021-01-10 DIAGNOSIS — R202 Paresthesia of skin: Secondary | ICD-10-CM | POA: Diagnosis not present

## 2021-01-10 DIAGNOSIS — G609 Hereditary and idiopathic neuropathy, unspecified: Secondary | ICD-10-CM | POA: Diagnosis not present

## 2021-01-10 DIAGNOSIS — R7303 Prediabetes: Secondary | ICD-10-CM | POA: Diagnosis not present

## 2021-01-10 DIAGNOSIS — M2550 Pain in unspecified joint: Secondary | ICD-10-CM | POA: Diagnosis not present

## 2021-01-11 ENCOUNTER — Ambulatory Visit: Payer: Medicare Other | Admitting: Rheumatology

## 2021-01-11 DIAGNOSIS — I10 Essential (primary) hypertension: Secondary | ICD-10-CM | POA: Diagnosis not present

## 2021-01-11 DIAGNOSIS — M069 Rheumatoid arthritis, unspecified: Secondary | ICD-10-CM | POA: Diagnosis not present

## 2021-01-11 DIAGNOSIS — E559 Vitamin D deficiency, unspecified: Secondary | ICD-10-CM | POA: Diagnosis not present

## 2021-01-11 DIAGNOSIS — R7303 Prediabetes: Secondary | ICD-10-CM | POA: Diagnosis not present

## 2021-01-11 DIAGNOSIS — E785 Hyperlipidemia, unspecified: Secondary | ICD-10-CM | POA: Diagnosis not present

## 2021-01-11 DIAGNOSIS — R202 Paresthesia of skin: Secondary | ICD-10-CM | POA: Diagnosis not present

## 2021-01-11 DIAGNOSIS — M2550 Pain in unspecified joint: Secondary | ICD-10-CM | POA: Diagnosis not present

## 2021-01-12 NOTE — Progress Notes (Signed)
Office Visit Note  Patient: Brittney Stewart             Date of Birth: 02/19/1940           MRN: [id number]             PCP: Audley Hose, MD Referring: Audley Hose, MD Visit Date: 01/26/2021 Occupation: @GUAROCC @  Subjective:  Lower back pain and lower extremity weakness  History of Present Illness: Brittney Stewart is a 81 y.o. female with a history of osteoarthritis and degenerative disc disease.  She states she developed COVID-19 infection in November 2022.  After that she started experiencing increased fatigue and discomfort.  She continues to have pain and discomfort in her legs which she describes in her joints and her muscles.  She also has been experiencing increased lower back pain.  Bilateral knee joints have been hurting without any joint swelling.  She has not noticed any joint swelling.  She was evaluated by her PCP who did some lab work and her rheumatoid factor was negative.  She has been having difficulty getting up from the chair.  No difficulty raising her arms.  She has some discomfort in her left shoulder which is chronic.    Activities of Daily Living:  Patient reports morning stiffness for 30 minutes.   Patient Reports nocturnal pain.  Difficulty dressing/grooming: Reports Difficulty climbing stairs: Reports Difficulty getting out of chair: Reports Difficulty using hands for taps, buttons, cutlery, and/or writing: Denies  Review of Systems  Constitutional:  Positive for fatigue and weight loss. Negative for night sweats and weight gain.       Intentional weight loss  HENT:  Positive for mouth dryness. Negative for mouth sores, trouble swallowing, trouble swallowing and nose dryness.        Uses retainer  Eyes:  Negative for pain, redness, visual disturbance and dryness.  Respiratory:  Negative for cough, shortness of breath and difficulty breathing.   Cardiovascular:  Negative for chest pain, palpitations, hypertension, irregular heartbeat and  swelling in legs/feet.  Gastrointestinal:  Negative for blood in stool, constipation and diarrhea.  Endocrine: Negative for increased urination.  Genitourinary:  Negative for vaginal dryness.  Musculoskeletal:  Positive for joint pain, joint pain, myalgias, morning stiffness, muscle tenderness and myalgias. Negative for joint swelling and muscle weakness.  Skin:  Negative for color change, rash, hair loss, skin tightness, ulcers and sensitivity to sunlight.  Allergic/Immunologic: Negative for susceptible to infections.  Neurological:  Negative for dizziness, memory loss, night sweats and weakness.  Hematological:  Negative for swollen glands.  Psychiatric/Behavioral:  Positive for sleep disturbance. Negative for depressed mood. The patient is not nervous/anxious.    PMFS History:  Patient Active Problem List   Diagnosis Date Noted   DDD (degenerative disc disease), cervical 09/21/2017   DDD (degenerative disc disease), lumbar 09/21/2017   Inflamed sebaceous cyst 12/04/2011   Hypertension 12/04/2011   GERD (gastroesophageal reflux disease) 12/04/2011   Allergic rhinitis 12/04/2011   Hyperlipidemia 12/04/2011    Past Medical History:  Diagnosis Date   Allergic rhinitis, cause unspecified    Benign heart murmur    GERD (gastroesophageal reflux disease)    Hypertension    Inflamed sebaceous cyst    Insomnia, unspecified    Macular degeneration, wet ([facility])    Left eye   Urine incontinence    Vitamin D deficiency     Family History  Problem Relation Age of Onset   Lung cancer Mother    Arthritis  Mother    Hypertension Mother    Hypertension Father    Stroke Father    Diabetes Father    Diabetes Sister    Cancer Sister    Hypertension Sister    Depression Sister    Hypertension Son    Past Surgical History:  Procedure Laterality Date   APPENDECTOMY     BACK SURGERY     CERVICAL SPINE SURGERY     COLONOSCOPY  10/11   dexa scan  02/2010   DIAGNOSTIC MAMMOGRAM  04/2010    PELVIC FRACTURE SURGERY  1968   SHOULDER SURGERY     complete right, partial left   TUMOR REMOVAL     Social History   Social History Narrative   Not on file   Immunization History  Administered Date(s) Administered   Influenza-Unspecified 01/02/2018   PFIZER(Purple Top)SARS-COV-2 Vaccination 01/17/2019, 02/07/2019, 10/30/2019     Objective: Vital Signs: BP 121/70 (BP Location: Left Arm, Patient Position: Sitting, Cuff Size: Small)    Pulse 65    Resp 12    Ht 5' 6.75" (1.695 m)    Wt 170 lb (77.1 kg)    BMI 26.83 kg/m    Physical Exam Vitals and nursing note reviewed.  Constitutional:      Appearance: She is well-developed.  HENT:     Head: Normocephalic and atraumatic.  Eyes:     Conjunctiva/sclera: Conjunctivae normal.  Cardiovascular:     Rate and Rhythm: Normal rate and regular rhythm.     Heart sounds: Normal heart sounds.  Pulmonary:     Effort: Pulmonary effort is normal.     Breath sounds: Normal breath sounds.  Abdominal:     General: Bowel sounds are normal.     Palpations: Abdomen is soft.  Musculoskeletal:     Cervical back: Normal range of motion.  Lymphadenopathy:     Cervical: No cervical adenopathy.  Skin:    General: Skin is warm and dry.     Capillary Refill: Capillary refill takes less than 2 seconds.  Neurological:     Mental Status: She is alert and oriented to person, place, and time.  Psychiatric:        Behavior: Behavior normal.     Musculoskeletal Exam: C-spine was in good range of motion.  Thoracic spine was in good range of motion.  She has ongoing discomfort in her lower back with limited range of motion.  Shoulder joints, elbow joints, wrist joints were in good range of motion.  She had bilateral CMC and DIP thickening with no synovitis.  Hip joints were in good range of motion.  Knee joints in good range of motion without any warmth swelling or effusion.  There was no tenderness over ankles or MTPs.  CDAI Exam: CDAI Score:  -- Patient Global: --; Provider Global: -- Swollen: --; Tender: -- Joint Exam 01/26/2021   No joint exam has been documented for this visit   There is currently no information documented on the homunculus. Go to the Rheumatology activity and complete the homunculus joint exam.  Investigation: No additional findings.  Imaging: No results found.  Recent Labs: Lab Results  Component Value Date   WBC 4.6 07/02/2016   HGB 13.6 07/02/2016   PLT 147 (L) 07/02/2016   NA 137 07/02/2016   K 4.8 07/02/2016   CL 102 07/02/2016   CO2 28 07/02/2016   GLUCOSE 98 07/02/2016   BUN 18 07/02/2016   CREATININE 0.79 07/02/2016   BILITOT 1.2 02/03/2008  ALKPHOS 69 02/03/2008   AST 23 02/03/2008   ALT 15 02/03/2008   PROT 7.3 02/03/2008   ALBUMIN 3.7 02/03/2008   CALCIUM 9.6 07/02/2016   GFRAA >60 07/02/2016   January 11, 2021 LDL 98, HDL 35, triglycerides 154, CMP creatinine 0.69, AST 13, ALT 13, magnesium normal, TSH normal, sed rate 2, CBC normal, B12 high, folate normal, C-reactive protein normal, vitamin D 88, rheumatoid factor 92, ANA negative  Speciality Comments: No specialty comments available.  Procedures:  No procedures performed Allergies: Penicillin v potassium   Assessment / Plan:     Visit Diagnoses: Primary osteoarthritis of both hands - RF+.  She has bilateral DIP and CMC prominence with no synovitis.  Myalgia -she has been experiencing increased muscle pain.  She complains of difficulty getting up from the chair.  She was unable to get up from the chair without TM support.  No upper extremity muscular weakness was noted.  She has experienced some deconditioning since she had COVID-19 infection.  I will refer her to physical therapy.  Plan: Sedimentation rate, CK, Serum protein electrophoresis with reflex  Muscular deconditioning-she had COVID-19 virus infection in November.  She states that she had muscular deconditioning since then.  Rheumatoid factor positive-she had  no synovitis on my examination.  Her rheumatoid factor is consistently elevated.  CRP was normal.  Dupuytren's contracture of right hand - Unchanged.  H/O total shoulder replacement, right-she had good range of motion.  History of left shoulder replacement-she had good range of motion with some discomfort.  Chronic pain of both knees -she has been having increased pain and discomfort in her bilateral knee joints.  No warmth swelling or effusion was noted.  Plan: XR KNEE 3 VIEW RIGHT, XR KNEE 3 VIEW LEFT.  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  No interval changes noted when compared to the x-rays of 2019.  I will refer her to physical therapy.  Pes planus of both feet  DDD (degenerative disc disease), cervical-she had limited range of motion without discomfort.  Chronic midline low back pain without sciatica -she has been experiencing increased pain and discomfort in her lumbar spine.  She has history of multilevel spondylosis and a spondylolisthesis.  Plan: XR Lumbar Spine 2-3 Views.  X-ray showed multilevel spondylosis facet joint arthropathy and a spondylolisthesis.  No significant change was noted when compared to the previous x-rays.  X-ray findings were discussed with the patient.  I will refer her to physical therapy.  DDD (degenerative disc disease), lumbar - Multilevel spondylosis was noted.  L4-L5 significant spondylolisthesis was noted.   Facet joint arthropathy was noted.  She has seen Dr. Ellene Route in the past.  Advised her to schedule a follow-up appointment for evaluation.  History of vertebral fracture - T9, L1, L2 due to prior injury.  History of hyperlipidemia  History of hypertension  History of seasonal allergies  History of gastroesophageal reflux (GERD)  Osteoporosis screening - reviewed DXA 01/23/2019 T score -1.0.   Orders: Orders Placed This Encounter  Procedures   XR Lumbar Spine 2-3 Views   XR KNEE 3 VIEW RIGHT   XR KNEE 3  VIEW LEFT   Sedimentation rate   CK   Serum protein electrophoresis with reflex   No orders of the defined types were placed in this encounter.   Face-to-face time spent with patient was 45 minutes. Greater than 50% of time was spent in counseling and coordination of care.  Follow-Up Instructions: Return in about 6  months (around 07/26/2021) for Osteoarthritis.   Bo Merino, MD  Note - This record has been created using Editor, commissioning.  Chart creation errors have been sought, but may not always  have been located. Such creation errors do not reflect on  the standard of medical care.

## 2021-01-26 ENCOUNTER — Ambulatory Visit: Payer: Self-pay

## 2021-01-26 ENCOUNTER — Encounter: Payer: Self-pay | Admitting: Rheumatology

## 2021-01-26 ENCOUNTER — Other Ambulatory Visit: Payer: Self-pay

## 2021-01-26 ENCOUNTER — Ambulatory Visit (INDEPENDENT_AMBULATORY_CARE_PROVIDER_SITE_OTHER): Payer: Medicare Other | Admitting: Rheumatology

## 2021-01-26 VITALS — BP 121/70 | HR 65 | Resp 12 | Ht 66.75 in | Wt 170.0 lb

## 2021-01-26 DIAGNOSIS — M5136 Other intervertebral disc degeneration, lumbar region: Secondary | ICD-10-CM | POA: Diagnosis not present

## 2021-01-26 DIAGNOSIS — M2142 Flat foot [pes planus] (acquired), left foot: Secondary | ICD-10-CM

## 2021-01-26 DIAGNOSIS — E782 Mixed hyperlipidemia: Secondary | ICD-10-CM | POA: Diagnosis not present

## 2021-01-26 DIAGNOSIS — M19041 Primary osteoarthritis, right hand: Secondary | ICD-10-CM

## 2021-01-26 DIAGNOSIS — Z8616 Personal history of COVID-19: Secondary | ICD-10-CM | POA: Diagnosis not present

## 2021-01-26 DIAGNOSIS — M25562 Pain in left knee: Secondary | ICD-10-CM

## 2021-01-26 DIAGNOSIS — Z889 Allergy status to unspecified drugs, medicaments and biological substances status: Secondary | ICD-10-CM

## 2021-01-26 DIAGNOSIS — Z8781 Personal history of (healed) traumatic fracture: Secondary | ICD-10-CM | POA: Diagnosis not present

## 2021-01-26 DIAGNOSIS — G8929 Other chronic pain: Secondary | ICD-10-CM

## 2021-01-26 DIAGNOSIS — M51369 Other intervertebral disc degeneration, lumbar region without mention of lumbar back pain or lower extremity pain: Secondary | ICD-10-CM

## 2021-01-26 DIAGNOSIS — M545 Low back pain, unspecified: Secondary | ICD-10-CM | POA: Diagnosis not present

## 2021-01-26 DIAGNOSIS — Z13820 Encounter for screening for osteoporosis: Secondary | ICD-10-CM

## 2021-01-26 DIAGNOSIS — M069 Rheumatoid arthritis, unspecified: Secondary | ICD-10-CM | POA: Diagnosis not present

## 2021-01-26 DIAGNOSIS — H8112 Benign paroxysmal vertigo, left ear: Secondary | ICD-10-CM | POA: Diagnosis not present

## 2021-01-26 DIAGNOSIS — M25561 Pain in right knee: Secondary | ICD-10-CM | POA: Diagnosis not present

## 2021-01-26 DIAGNOSIS — I700 Atherosclerosis of aorta: Secondary | ICD-10-CM | POA: Diagnosis not present

## 2021-01-26 DIAGNOSIS — M19042 Primary osteoarthritis, left hand: Secondary | ICD-10-CM

## 2021-01-26 DIAGNOSIS — Z96611 Presence of right artificial shoulder joint: Secondary | ICD-10-CM

## 2021-01-26 DIAGNOSIS — Z8679 Personal history of other diseases of the circulatory system: Secondary | ICD-10-CM

## 2021-01-26 DIAGNOSIS — R42 Dizziness and giddiness: Secondary | ICD-10-CM | POA: Diagnosis not present

## 2021-01-26 DIAGNOSIS — R29898 Other symptoms and signs involving the musculoskeletal system: Secondary | ICD-10-CM

## 2021-01-26 DIAGNOSIS — M5030 Other cervical disc degeneration, unspecified cervical region: Secondary | ICD-10-CM | POA: Diagnosis not present

## 2021-01-26 DIAGNOSIS — H3530 Unspecified macular degeneration: Secondary | ICD-10-CM | POA: Diagnosis not present

## 2021-01-26 DIAGNOSIS — Z8719 Personal history of other diseases of the digestive system: Secondary | ICD-10-CM

## 2021-01-26 DIAGNOSIS — M720 Palmar fascial fibromatosis [Dupuytren]: Secondary | ICD-10-CM

## 2021-01-26 DIAGNOSIS — R768 Other specified abnormal immunological findings in serum: Secondary | ICD-10-CM | POA: Diagnosis not present

## 2021-01-26 DIAGNOSIS — Z8639 Personal history of other endocrine, nutritional and metabolic disease: Secondary | ICD-10-CM | POA: Diagnosis not present

## 2021-01-26 DIAGNOSIS — M2141 Flat foot [pes planus] (acquired), right foot: Secondary | ICD-10-CM

## 2021-01-26 DIAGNOSIS — Z96612 Presence of left artificial shoulder joint: Secondary | ICD-10-CM

## 2021-01-26 DIAGNOSIS — M791 Myalgia, unspecified site: Secondary | ICD-10-CM | POA: Diagnosis not present

## 2021-01-26 DIAGNOSIS — R351 Nocturia: Secondary | ICD-10-CM | POA: Diagnosis not present

## 2021-01-26 NOTE — Addendum Note (Signed)
Addended by: Earnestine Mealing on: 01/26/2021 04:50 PM   Modules accepted: Orders

## 2021-01-27 DIAGNOSIS — H353221 Exudative age-related macular degeneration, left eye, with active choroidal neovascularization: Secondary | ICD-10-CM | POA: Diagnosis not present

## 2021-02-02 LAB — SEDIMENTATION RATE: Sed Rate: 2 mm/h (ref 0–30)

## 2021-02-02 LAB — PROTEIN ELECTROPHORESIS, SERUM, WITH REFLEX
Albumin ELP: 4.4 g/dL (ref 3.8–4.8)
Alpha 1: 0.3 g/dL (ref 0.2–0.3)
Alpha 2: 0.9 g/dL (ref 0.5–0.9)
Beta 2: 0.3 g/dL (ref 0.2–0.5)
Beta Globulin: 0.4 g/dL (ref 0.4–0.6)
Gamma Globulin: 0.7 g/dL — ABNORMAL LOW (ref 0.8–1.7)
Total Protein: 6.9 g/dL (ref 6.1–8.1)

## 2021-02-02 LAB — IFE INTERPRETATION: Immunofix Electr Int: NOT DETECTED

## 2021-02-02 LAB — CK: Total CK: 56 U/L (ref 29–143)

## 2021-02-02 NOTE — Progress Notes (Signed)
All the labs are within normal limits.  SPEP showed mild hypogammaglobulinemia but IFE was negative.

## 2021-02-09 DIAGNOSIS — R29898 Other symptoms and signs involving the musculoskeletal system: Secondary | ICD-10-CM | POA: Diagnosis not present

## 2021-02-09 DIAGNOSIS — M5136 Other intervertebral disc degeneration, lumbar region: Secondary | ICD-10-CM | POA: Diagnosis not present

## 2021-02-09 DIAGNOSIS — M6281 Muscle weakness (generalized): Secondary | ICD-10-CM | POA: Diagnosis not present

## 2021-02-14 DIAGNOSIS — M5136 Other intervertebral disc degeneration, lumbar region: Secondary | ICD-10-CM | POA: Diagnosis not present

## 2021-02-14 DIAGNOSIS — M6281 Muscle weakness (generalized): Secondary | ICD-10-CM | POA: Diagnosis not present

## 2021-02-14 DIAGNOSIS — R29898 Other symptoms and signs involving the musculoskeletal system: Secondary | ICD-10-CM | POA: Diagnosis not present

## 2021-02-18 DIAGNOSIS — M6281 Muscle weakness (generalized): Secondary | ICD-10-CM | POA: Diagnosis not present

## 2021-02-18 DIAGNOSIS — R29898 Other symptoms and signs involving the musculoskeletal system: Secondary | ICD-10-CM | POA: Diagnosis not present

## 2021-02-18 DIAGNOSIS — M5136 Other intervertebral disc degeneration, lumbar region: Secondary | ICD-10-CM | POA: Diagnosis not present

## 2021-02-23 DIAGNOSIS — M5136 Other intervertebral disc degeneration, lumbar region: Secondary | ICD-10-CM | POA: Diagnosis not present

## 2021-02-23 DIAGNOSIS — M6281 Muscle weakness (generalized): Secondary | ICD-10-CM | POA: Diagnosis not present

## 2021-02-23 DIAGNOSIS — R29898 Other symptoms and signs involving the musculoskeletal system: Secondary | ICD-10-CM | POA: Diagnosis not present

## 2021-02-28 DIAGNOSIS — M6281 Muscle weakness (generalized): Secondary | ICD-10-CM | POA: Diagnosis not present

## 2021-02-28 DIAGNOSIS — M5136 Other intervertebral disc degeneration, lumbar region: Secondary | ICD-10-CM | POA: Diagnosis not present

## 2021-02-28 DIAGNOSIS — R29898 Other symptoms and signs involving the musculoskeletal system: Secondary | ICD-10-CM | POA: Diagnosis not present

## 2021-03-03 DIAGNOSIS — R29898 Other symptoms and signs involving the musculoskeletal system: Secondary | ICD-10-CM | POA: Diagnosis not present

## 2021-03-03 DIAGNOSIS — M5136 Other intervertebral disc degeneration, lumbar region: Secondary | ICD-10-CM | POA: Diagnosis not present

## 2021-03-03 DIAGNOSIS — M6281 Muscle weakness (generalized): Secondary | ICD-10-CM | POA: Diagnosis not present

## 2021-03-07 DIAGNOSIS — M6281 Muscle weakness (generalized): Secondary | ICD-10-CM | POA: Diagnosis not present

## 2021-03-07 DIAGNOSIS — M5136 Other intervertebral disc degeneration, lumbar region: Secondary | ICD-10-CM | POA: Diagnosis not present

## 2021-03-07 DIAGNOSIS — R29898 Other symptoms and signs involving the musculoskeletal system: Secondary | ICD-10-CM | POA: Diagnosis not present

## 2021-03-10 DIAGNOSIS — H353112 Nonexudative age-related macular degeneration, right eye, intermediate dry stage: Secondary | ICD-10-CM | POA: Diagnosis not present

## 2021-03-10 DIAGNOSIS — H353221 Exudative age-related macular degeneration, left eye, with active choroidal neovascularization: Secondary | ICD-10-CM | POA: Diagnosis not present

## 2021-03-14 DIAGNOSIS — E785 Hyperlipidemia, unspecified: Secondary | ICD-10-CM | POA: Diagnosis not present

## 2021-03-14 DIAGNOSIS — I10 Essential (primary) hypertension: Secondary | ICD-10-CM | POA: Diagnosis not present

## 2021-03-14 DIAGNOSIS — I5030 Unspecified diastolic (congestive) heart failure: Secondary | ICD-10-CM | POA: Diagnosis not present

## 2021-03-14 DIAGNOSIS — E559 Vitamin D deficiency, unspecified: Secondary | ICD-10-CM | POA: Diagnosis not present

## 2021-03-17 DIAGNOSIS — M6281 Muscle weakness (generalized): Secondary | ICD-10-CM | POA: Diagnosis not present

## 2021-03-17 DIAGNOSIS — R29898 Other symptoms and signs involving the musculoskeletal system: Secondary | ICD-10-CM | POA: Diagnosis not present

## 2021-03-17 DIAGNOSIS — M5136 Other intervertebral disc degeneration, lumbar region: Secondary | ICD-10-CM | POA: Diagnosis not present

## 2021-03-18 NOTE — Progress Notes (Signed)
? ?Office Visit Note ? ?Patient: Brittney Stewart             ?Date of Birth: 1940/11/24           ?MRN: [id number]             ?PCP: Audley Hose, MD ?Referring: Audley Hose, MD ?Visit Date: 03/31/2021 ?Occupation: '@GUAROCC'$ @ ? ?Subjective:  ?Follow-up (PT, improving) ? ? ?History of Present Illness: Brittney Stewart is a 81 y.o. female with history of osteoarthritis, positive rheumatoid factor and degenerative disc disease.  She was seen last in January 2023 with the generalized deconditioning after the COVID-19 infection.  She was referred to physical therapy.  She has been going to physical therapy on a regular basis.  She states she is gradually gaining his strength back into her muscles.  She states that she has less difficulty getting out of the chair now.  She denies any increased muscle weakness.  No history of headaches. ? ?Activities of Daily Living:  ?Patient reports morning stiffness for 20 minutes.   ?Patient Reports nocturnal pain.  ?Difficulty dressing/grooming: Denies ?Difficulty climbing stairs: Reports ?Difficulty getting out of chair: Reports ?Difficulty using hands for taps, buttons, cutlery, and/or writing: Denies ? ?Review of Systems  ?Constitutional:  Negative for fatigue.  ?HENT:  Negative for mouth dryness.   ?Eyes:  Negative for dryness.  ?Respiratory:  Negative for shortness of breath.   ?Cardiovascular:  Negative for swelling in legs/feet.  ?Gastrointestinal:  Negative for constipation.  ?Endocrine: Positive for heat intolerance.  ?Genitourinary:  Negative for difficulty urinating.  ?Musculoskeletal:  Positive for joint pain, gait problem, joint pain, muscle weakness, morning stiffness and muscle tenderness.  ?Skin:  Negative for rash.  ?Allergic/Immunologic: Negative for susceptible to infections.  ?Neurological:  Positive for weakness.  ?Hematological:  Negative for bruising/bleeding tendency.  ?Psychiatric/Behavioral:  Positive for sleep disturbance.   ? ?PMFS History:   ?Patient Active Problem List  ? Diagnosis Date Noted  ? DDD (degenerative disc disease), cervical 09/21/2017  ? DDD (degenerative disc disease), lumbar 09/21/2017  ? Inflamed sebaceous cyst 12/04/2011  ? Hypertension 12/04/2011  ? GERD (gastroesophageal reflux disease) 12/04/2011  ? Allergic rhinitis 12/04/2011  ? Hyperlipidemia 12/04/2011  ?  ?Past Medical History:  ?Diagnosis Date  ? Allergic rhinitis, cause unspecified   ? Benign heart murmur   ? GERD (gastroesophageal reflux disease)   ? Hypertension   ? Inflamed sebaceous cyst   ? Insomnia, unspecified   ? Macular degeneration, wet ([place])   ? Left eye  ? Urine incontinence   ? Vitamin D deficiency   ?  ?Family History  ?Problem Relation Age of Onset  ? Lung cancer Mother   ? Arthritis Mother   ? Hypertension Mother   ? Hypertension Father   ? Stroke Father   ? Diabetes Father   ? Diabetes Sister   ? Cancer Sister   ? Hypertension Sister   ? Depression Sister   ? Hypertension Son   ? ?Past Surgical History:  ?Procedure Laterality Date  ? APPENDECTOMY    ? BACK SURGERY    ? CERVICAL SPINE SURGERY    ? COLONOSCOPY  10/11  ? dexa scan  02/2010  ? DIAGNOSTIC MAMMOGRAM  04/2010  ? [place]  ? SHOULDER SURGERY    ? complete right, partial left  ? TUMOR REMOVAL    ? ?Social History  ? ?Social History Narrative  ? Not on file  ? ?Immunization History  ?  Administered Date(s) Administered  ? Influenza-Unspecified 01/02/2018  ? PFIZER(Purple Top)SARS-COV-2 Vaccination 01/17/2019, 02/07/2019, 10/30/2019  ?  ? ?Objective: ?Vital Signs: BP (!) 152/77 (BP Location: Right Arm, Patient Position: Sitting, Cuff Size: Normal)   Pulse 67   Resp 16   Ht 5' 6.5" (1.689 m)   Wt 173 lb (78.5 kg)   BMI 27.50 kg/m?   ? ?Physical Exam ?Vitals and nursing note reviewed.  ?Constitutional:   ?   Appearance: She is well-developed.  ?HENT:  ?   Head: Normocephalic and atraumatic.  ?Eyes:  ?   Conjunctiva/sclera: Conjunctivae normal.  ?Cardiovascular:  ?   Rate and Rhythm:  Normal rate and regular rhythm.  ?   Heart sounds: Normal heart sounds.  ?Pulmonary:  ?   Effort: Pulmonary effort is normal.  ?   Breath sounds: Normal breath sounds.  ?Abdominal:  ?   General: Bowel sounds are normal.  ?   Palpations: Abdomen is soft.  ?Musculoskeletal:  ?   Cervical back: Normal range of motion.  ?Lymphadenopathy:  ?   Cervical: No cervical adenopathy.  ?Skin: ?   General: Skin is warm and dry.  ?   Capillary Refill: Capillary refill takes less than 2 seconds.  ?Neurological:  ?   Mental Status: She is alert and oriented to person, place, and time.  ?Psychiatric:     ?   Behavior: Behavior normal.  ?  ? ?Musculoskeletal Exam: She has some stiffness with range of motion of the cervical spine.  Thoracic spine was in good range of motion.  She had limited range of motion of her lumbar spine.  Shoulder joints, elbow joints, wrist joints with good range of motion.  She had bilateral PIP and DIP thickening with no synovitis.  Hip Ro has been good range of motion without any discomfort.  She good range of motion of bilateral knee joints without any warmth, swelling or effusion.  She had no tenderness over ankles or MTPs.  Her strength 5 /5 in all 4 extremities. ? ?CDAI Exam: ?CDAI Score: -- ?Patient Global: --; Provider Global: -- ?Swollen: --; Tender: -- ?Joint Exam 03/31/2021  ? ?No joint exam has been documented for this visit  ? ?There is currently no information documented on the homunculus. Go to the Rheumatology activity and complete the homunculus joint exam. ? ?Investigation: ?No additional findings. ? ?Imaging: ?No results found. ? ?Recent Labs: ?Lab Results  ?Component Value Date  ? WBC 4.6 07/02/2016  ? HGB 13.6 07/02/2016  ? PLT 147 (L) 07/02/2016  ? NA 137 07/02/2016  ? K 4.8 07/02/2016  ? CL 102 07/02/2016  ? CO2 28 07/02/2016  ? GLUCOSE 98 07/02/2016  ? BUN 18 07/02/2016  ? CREATININE 0.79 07/02/2016  ? BILITOT 1.2 02/03/2008  ? ALKPHOS 69 02/03/2008  ? AST 23 02/03/2008  ? ALT 15  02/03/2008  ? PROT 6.9 01/26/2021  ? ALBUMIN 3.7 02/03/2008  ? CALCIUM 9.6 07/02/2016  ? GFRAA >60 07/02/2016  ? ? ?Speciality Comments: No specialty comments available. ? ?Procedures:  ?No procedures performed ?Allergies: Penicillin v potassium  ? ?Assessment / Plan:     ?Visit Diagnoses: Primary osteoarthritis of both hands -she had no synovitis on my examination.  She had bilateral CMC, PIP and DIP thickening consistent with osteoarthritis.  No synovitis was noted.  Joint protection was discussed.   ? ?Rheumatoid factor positive-her rheumatoid factor was 92 on January 2023.  She had no synovitis on examination. ? ?Muscular deconditioning - she  had COVID-19 virus infection in November.  She developed generalized muscle deconditioning after the COVID-19 infection.  She has been going to physical therapy and gaining strength gradually. ? ?Myalgia-she was experiencing myalgias after the COVID-19 infection.  The myalgias have resolved.  She had no muscular weakness.  There was no temporal artery tenderness. ? ?Dupuytren's contracture of right hand - Unchanged. ? ?H/O total shoulder replacement, right-she had good range of motion in her shoulder joint. ? ?History of left shoulder replacement-she had good range of motion without discomfort. ? 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  No interval changes noted when compared to the x-rays of 2019.  She has been going to physical therapy which has been helpful.  She has been using treadmill, stepper and a bike. ? ?Pes planus of both feet-use of orthotics was discussed. ? ?DDD (degenerative disc disease), cervical-she has stiffness with range of motion of her cervical spine. ? ?Chronic midline low back pain without sciatica - X-ray showed multilevel spondylosis facet joint arthropathy and a spondylolisthesis.  ? ?DDD (degenerative disc disease), lumbar - Multilevel spondylosis was noted.  L4-L5 significant  spondylolisthesis was noted.   Facet joint arthropathy was noted.  She has seen Dr. Ellene Route in the past.  ? ?History of vertebral fracture - T9, L1, L2 due to prior injury. ? ?History of hypertension-her systolic blood pressur

## 2021-03-24 DIAGNOSIS — M5136 Other intervertebral disc degeneration, lumbar region: Secondary | ICD-10-CM | POA: Diagnosis not present

## 2021-03-24 DIAGNOSIS — R29898 Other symptoms and signs involving the musculoskeletal system: Secondary | ICD-10-CM | POA: Diagnosis not present

## 2021-03-24 DIAGNOSIS — M6281 Muscle weakness (generalized): Secondary | ICD-10-CM | POA: Diagnosis not present

## 2021-03-31 ENCOUNTER — Ambulatory Visit (INDEPENDENT_AMBULATORY_CARE_PROVIDER_SITE_OTHER): Payer: Medicare Other | Admitting: Rheumatology

## 2021-03-31 ENCOUNTER — Encounter: Payer: Self-pay | Admitting: Rheumatology

## 2021-03-31 VITALS — BP 152/77 | HR 67 | Resp 16 | Ht 66.5 in | Wt 173.0 lb

## 2021-03-31 DIAGNOSIS — M545 Low back pain, unspecified: Secondary | ICD-10-CM

## 2021-03-31 DIAGNOSIS — M5136 Other intervertebral disc degeneration, lumbar region: Secondary | ICD-10-CM

## 2021-03-31 DIAGNOSIS — M19041 Primary osteoarthritis, right hand: Secondary | ICD-10-CM | POA: Diagnosis not present

## 2021-03-31 DIAGNOSIS — Z8781 Personal history of (healed) traumatic fracture: Secondary | ICD-10-CM | POA: Diagnosis not present

## 2021-03-31 DIAGNOSIS — Z8679 Personal history of other diseases of the circulatory system: Secondary | ICD-10-CM

## 2021-03-31 DIAGNOSIS — M5030 Other cervical disc degeneration, unspecified cervical region: Secondary | ICD-10-CM

## 2021-03-31 DIAGNOSIS — M720 Palmar fascial fibromatosis [Dupuytren]: Secondary | ICD-10-CM | POA: Diagnosis not present

## 2021-03-31 DIAGNOSIS — Z96611 Presence of right artificial shoulder joint: Secondary | ICD-10-CM

## 2021-03-31 DIAGNOSIS — R29898 Other symptoms and signs involving the musculoskeletal system: Secondary | ICD-10-CM | POA: Diagnosis not present

## 2021-03-31 DIAGNOSIS — Z889 Allergy status to unspecified drugs, medicaments and biological substances status: Secondary | ICD-10-CM

## 2021-03-31 DIAGNOSIS — R768 Other specified abnormal immunological findings in serum: Secondary | ICD-10-CM

## 2021-03-31 DIAGNOSIS — M19042 Primary osteoarthritis, left hand: Secondary | ICD-10-CM

## 2021-03-31 DIAGNOSIS — Z8719 Personal history of other diseases of the digestive system: Secondary | ICD-10-CM

## 2021-03-31 DIAGNOSIS — M791 Myalgia, unspecified site: Secondary | ICD-10-CM

## 2021-03-31 DIAGNOSIS — Z13820 Encounter for screening for osteoporosis: Secondary | ICD-10-CM

## 2021-03-31 DIAGNOSIS — M2141 Flat foot [pes planus] (acquired), right foot: Secondary | ICD-10-CM

## 2021-03-31 DIAGNOSIS — M25561 Pain in right knee: Secondary | ICD-10-CM

## 2021-03-31 DIAGNOSIS — M2142 Flat foot [pes planus] (acquired), left foot: Secondary | ICD-10-CM

## 2021-03-31 DIAGNOSIS — M25562 Pain in left knee: Secondary | ICD-10-CM

## 2021-03-31 DIAGNOSIS — Z96612 Presence of left artificial shoulder joint: Secondary | ICD-10-CM

## 2021-03-31 DIAGNOSIS — G8929 Other chronic pain: Secondary | ICD-10-CM

## 2021-03-31 DIAGNOSIS — Z8639 Personal history of other endocrine, nutritional and metabolic disease: Secondary | ICD-10-CM

## 2021-04-05 DIAGNOSIS — M5136 Other intervertebral disc degeneration, lumbar region: Secondary | ICD-10-CM | POA: Diagnosis not present

## 2021-04-05 DIAGNOSIS — R29898 Other symptoms and signs involving the musculoskeletal system: Secondary | ICD-10-CM | POA: Diagnosis not present

## 2021-04-05 DIAGNOSIS — M6281 Muscle weakness (generalized): Secondary | ICD-10-CM | POA: Diagnosis not present

## 2021-04-12 DIAGNOSIS — R29898 Other symptoms and signs involving the musculoskeletal system: Secondary | ICD-10-CM | POA: Diagnosis not present

## 2021-04-12 DIAGNOSIS — M6281 Muscle weakness (generalized): Secondary | ICD-10-CM | POA: Diagnosis not present

## 2021-04-12 DIAGNOSIS — M5136 Other intervertebral disc degeneration, lumbar region: Secondary | ICD-10-CM | POA: Diagnosis not present

## 2021-04-13 DIAGNOSIS — H903 Sensorineural hearing loss, bilateral: Secondary | ICD-10-CM | POA: Diagnosis not present

## 2021-04-13 DIAGNOSIS — I10 Essential (primary) hypertension: Secondary | ICD-10-CM | POA: Diagnosis not present

## 2021-04-13 DIAGNOSIS — R6889 Other general symptoms and signs: Secondary | ICD-10-CM | POA: Diagnosis not present

## 2021-04-13 DIAGNOSIS — M542 Cervicalgia: Secondary | ICD-10-CM | POA: Diagnosis not present

## 2021-04-13 DIAGNOSIS — R519 Headache, unspecified: Secondary | ICD-10-CM | POA: Diagnosis not present

## 2021-04-13 DIAGNOSIS — E785 Hyperlipidemia, unspecified: Secondary | ICD-10-CM | POA: Diagnosis not present

## 2021-04-18 DIAGNOSIS — M6281 Muscle weakness (generalized): Secondary | ICD-10-CM | POA: Diagnosis not present

## 2021-04-18 DIAGNOSIS — R29898 Other symptoms and signs involving the musculoskeletal system: Secondary | ICD-10-CM | POA: Diagnosis not present

## 2021-04-18 DIAGNOSIS — M5136 Other intervertebral disc degeneration, lumbar region: Secondary | ICD-10-CM | POA: Diagnosis not present

## 2021-04-21 DIAGNOSIS — H353112 Nonexudative age-related macular degeneration, right eye, intermediate dry stage: Secondary | ICD-10-CM | POA: Diagnosis not present

## 2021-04-21 DIAGNOSIS — H353221 Exudative age-related macular degeneration, left eye, with active choroidal neovascularization: Secondary | ICD-10-CM | POA: Diagnosis not present

## 2021-04-27 DIAGNOSIS — M5136 Other intervertebral disc degeneration, lumbar region: Secondary | ICD-10-CM | POA: Diagnosis not present

## 2021-04-27 DIAGNOSIS — R29898 Other symptoms and signs involving the musculoskeletal system: Secondary | ICD-10-CM | POA: Diagnosis not present

## 2021-04-27 DIAGNOSIS — M6281 Muscle weakness (generalized): Secondary | ICD-10-CM | POA: Diagnosis not present

## 2021-05-01 IMAGING — MR MR LUMBAR SPINE W/O CM
4 of 5 series · 18 of 48 positions shown · non-contrast
Comparison: Lumbar radiographs 01/16/2019

CLINICAL DATA: Chronic midline low back pain. Rule out compression
fracture.

EXAM:
MRI LUMBAR SPINE WITHOUT CONTRAST
TECHNIQUE: Multiplanar, multisequence MR imaging of the lumbar spine was
performed. No intravenous contrast was administered.

[Series 3: T1 · sagittal · 4.0mm · 0.51mm/px · 3 of 15 slices shown (1 of 2)]
[im 3/15]
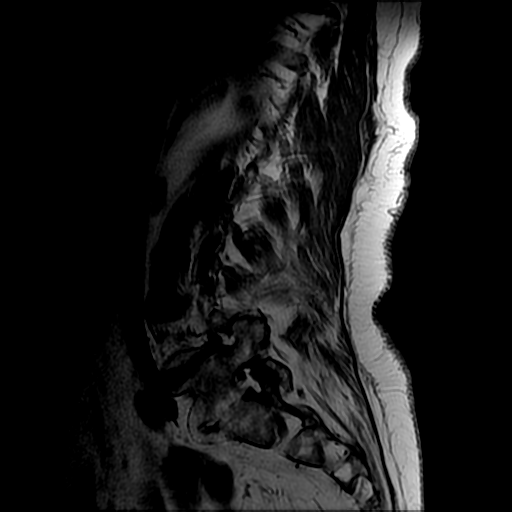
[im 9/15]
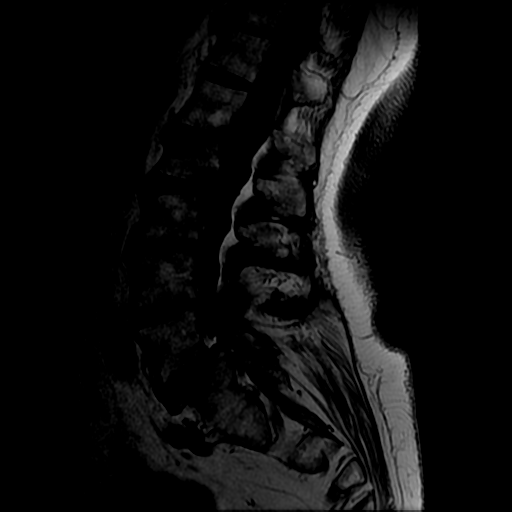
[im 15/15]
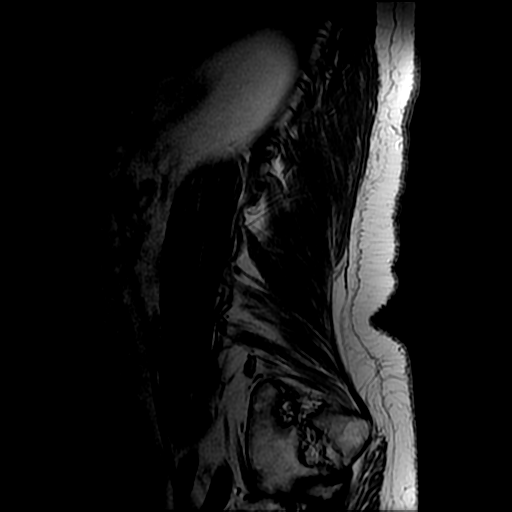

[Series 4: T2 post-contrast · sagittal · 4.0mm · 0.51mm/px · 6 of 15 slices shown]
[im 1/15]
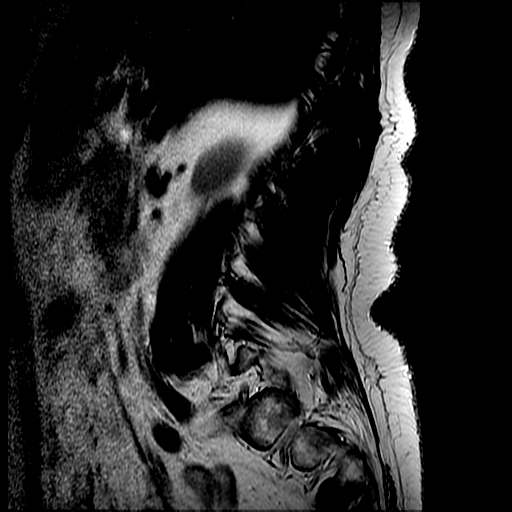
[im 3/15]
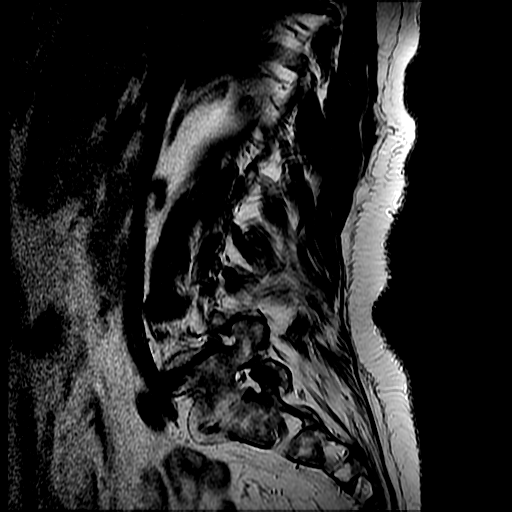
[im 6/15]
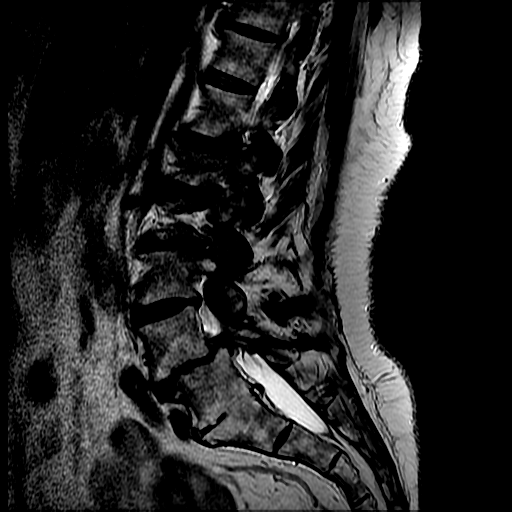
[im 9/15]
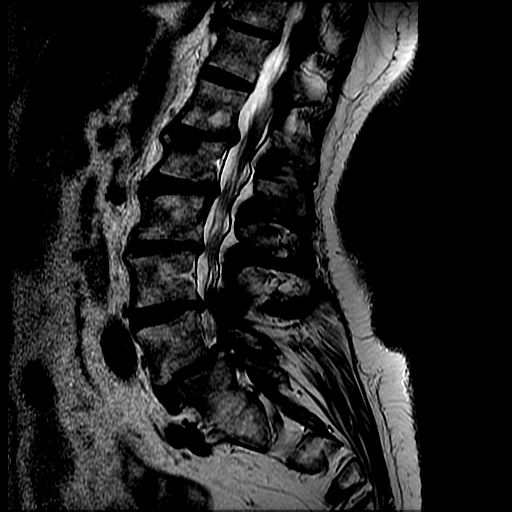
[im 12/15]
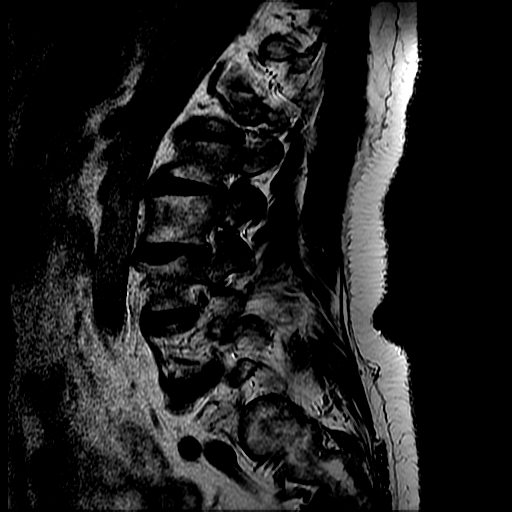
[im 15/15]
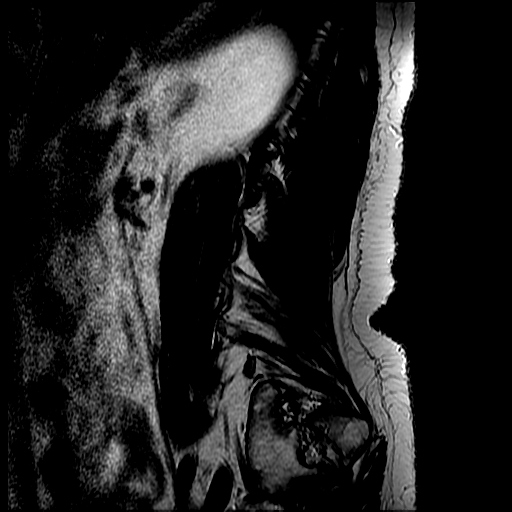

[Series 6: T2 · axial · 4.0mm · 0.39mm/px · z∈[-166,+3]mm · 6 of 35 slices shown]
[im 1/35]
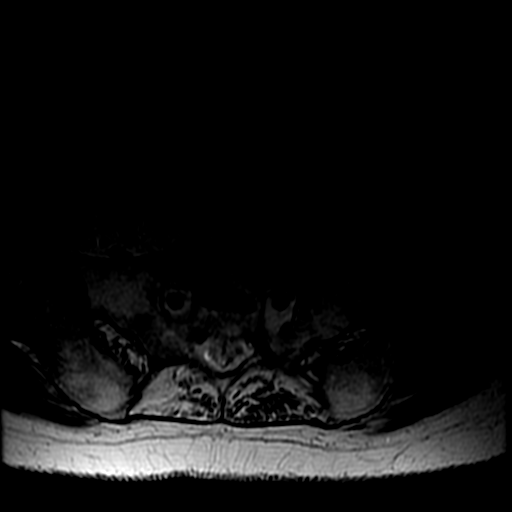
[im 5/35]
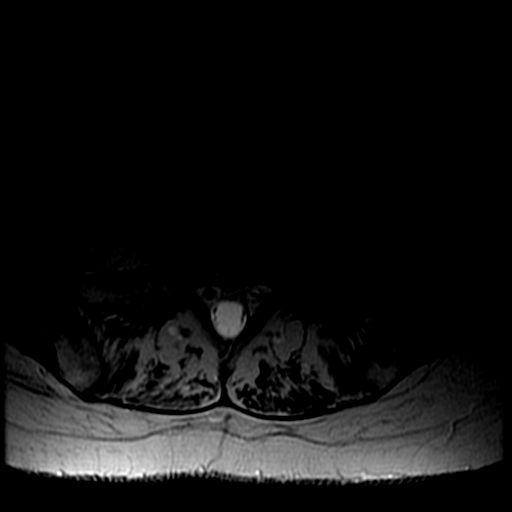
[im 10/35]
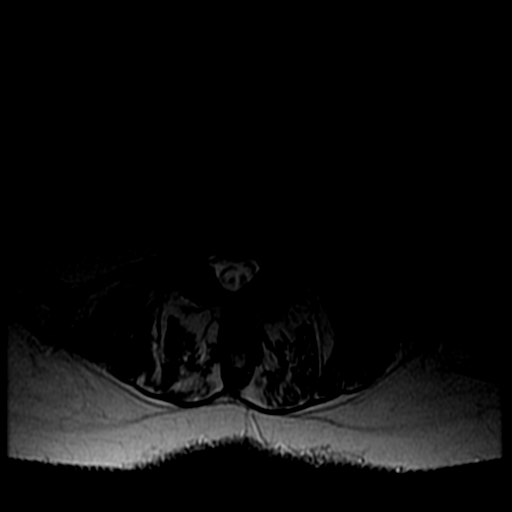
[im 15/35]
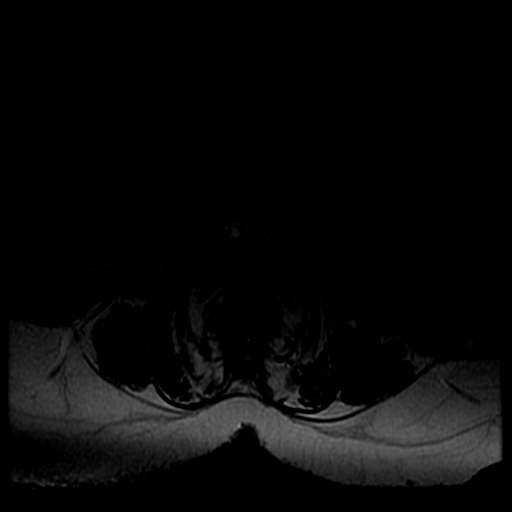
[im 18/35]
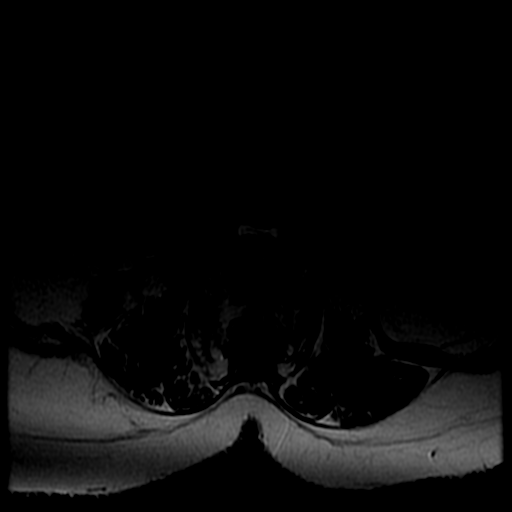
[im 30/35]
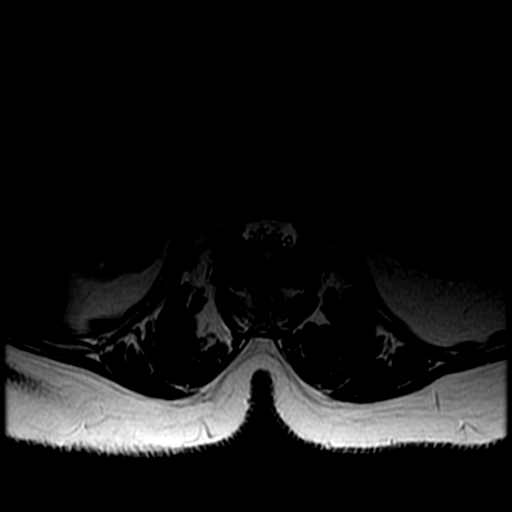

[Series 7: T1 · axial · 4.0mm · 0.39mm/px · z∈[-146,+3]mm · 3 of 35 slices shown (2 of 2)]
[im 5/35]
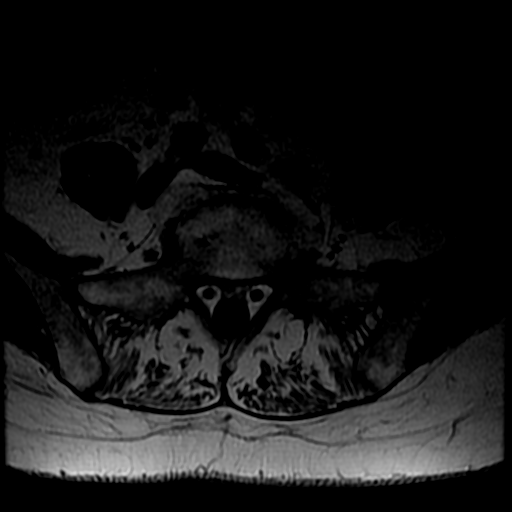
[im 18/35]
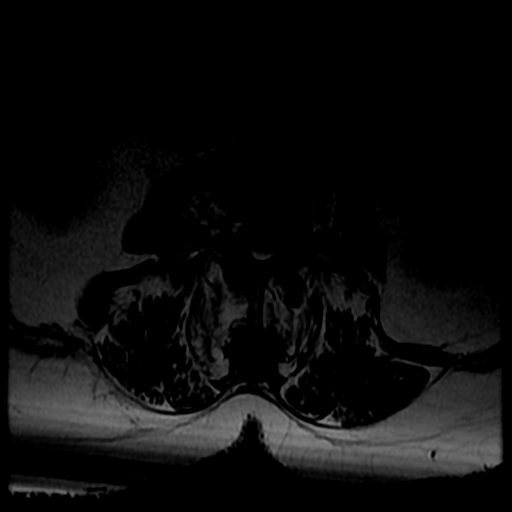
[im 30/35]
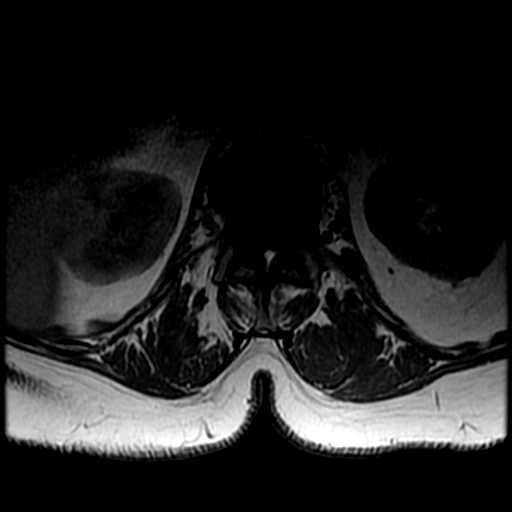

[18 of 48 positions shown; findings below may reference images not displayed]

FINDINGS: Segmentation:  Normal

Alignment: Mild retrolisthesis L1-2, L2-3, L3-4. 8 mm
anterolisthesis L4-5.

Mild levoscoliosis.

Vertebrae: Mild superior endplate fracture of L1. Fracture is
anterior and extends to the right. No bone marrow edema. This
appears chronic. No acute fracture or mass.

Conus medullaris and cauda equina: Conus extends to the L1-2 level.
Conus and cauda equina appear normal.

Paraspinal and other soft tissues: Negative for paraspinous mass or
adenopathy. Negative for soft tissue edema.

Disc levels:

T12-L1: Small extruded disc fragment with upgoing disc material on
the right. Additional disc degeneration and spurring is present with
bilateral facet hypertrophy. Mild spinal stenosis and moderate
foraminal stenosis bilaterally

L1-2: Moderate disc degeneration with disc space narrowing and
diffuse endplate spurring. Bilateral facet hypertrophy. Moderate
spinal stenosis. Moderate subarticular and foraminal stenosis
bilaterally. Extruded disc fragment on the right with downgoing disc
material.

L2-3: Moderate to advanced disc degeneration with diffuse endplate
spurring and bilateral facet hypertrophy. Moderate spinal stenosis.
Moderate subarticular and foraminal stenosis on the right.

L3-4: Disc degeneration and spondylosis. Moderate facet and
ligamentum flavum hypertrophy. Moderate spinal stenosis. Moderate
subarticular and foraminal stenosis on the left

L4-5: 8 mm anterolisthesis with severe facet degeneration. Severe
spinal stenosis. Severe subarticular and foraminal stenosis on the
right. Moderate to severe subarticular stenosis on the left.

L5-S1: Advanced disc degeneration with arthrodesis. Diffuse endplate
spurring without significant spinal stenosis.
IMPRESSION: Chronic fracture L1 superior endplate on the right. No acute
fracture.

Scoliosis and multilevel degenerative change throughout the lumbar
spine causing spinal and foraminal stenosis

Spinal stenosis is severe at L4-5 with severe subarticular and
foraminal stenosis right greater than left. 8 mm anterolisthesis
L4-5 appears degenerative.

## 2021-05-09 DIAGNOSIS — Z6827 Body mass index (BMI) 27.0-27.9, adult: Secondary | ICD-10-CM | POA: Diagnosis not present

## 2021-05-09 DIAGNOSIS — Z01419 Encounter for gynecological examination (general) (routine) without abnormal findings: Secondary | ICD-10-CM | POA: Diagnosis not present

## 2021-05-09 DIAGNOSIS — Z1231 Encounter for screening mammogram for malignant neoplasm of breast: Secondary | ICD-10-CM | POA: Diagnosis not present

## 2021-05-09 DIAGNOSIS — N952 Postmenopausal atrophic vaginitis: Secondary | ICD-10-CM | POA: Diagnosis not present

## 2021-05-18 DIAGNOSIS — M6281 Muscle weakness (generalized): Secondary | ICD-10-CM | POA: Diagnosis not present

## 2021-05-18 DIAGNOSIS — R29898 Other symptoms and signs involving the musculoskeletal system: Secondary | ICD-10-CM | POA: Diagnosis not present

## 2021-05-18 DIAGNOSIS — M5136 Other intervertebral disc degeneration, lumbar region: Secondary | ICD-10-CM | POA: Diagnosis not present

## 2021-05-23 ENCOUNTER — Encounter: Payer: Self-pay | Admitting: Dermatology

## 2021-05-23 ENCOUNTER — Ambulatory Visit (INDEPENDENT_AMBULATORY_CARE_PROVIDER_SITE_OTHER): Payer: Medicare Other | Admitting: Dermatology

## 2021-05-23 DIAGNOSIS — D2339 Other benign neoplasm of skin of other parts of face: Secondary | ICD-10-CM

## 2021-05-23 DIAGNOSIS — D239 Other benign neoplasm of skin, unspecified: Secondary | ICD-10-CM

## 2021-05-23 DIAGNOSIS — L918 Other hypertrophic disorders of the skin: Secondary | ICD-10-CM

## 2021-05-23 DIAGNOSIS — D1801 Hemangioma of skin and subcutaneous tissue: Secondary | ICD-10-CM | POA: Diagnosis not present

## 2021-05-23 DIAGNOSIS — Z1283 Encounter for screening for malignant neoplasm of skin: Secondary | ICD-10-CM

## 2021-05-23 DIAGNOSIS — L720 Epidermal cyst: Secondary | ICD-10-CM | POA: Diagnosis not present

## 2021-05-23 DIAGNOSIS — L821 Other seborrheic keratosis: Secondary | ICD-10-CM | POA: Diagnosis not present

## 2021-05-23 DIAGNOSIS — L570 Actinic keratosis: Secondary | ICD-10-CM | POA: Diagnosis not present

## 2021-05-26 DIAGNOSIS — M5136 Other intervertebral disc degeneration, lumbar region: Secondary | ICD-10-CM | POA: Diagnosis not present

## 2021-05-26 DIAGNOSIS — R29898 Other symptoms and signs involving the musculoskeletal system: Secondary | ICD-10-CM | POA: Diagnosis not present

## 2021-05-26 DIAGNOSIS — M6281 Muscle weakness (generalized): Secondary | ICD-10-CM | POA: Diagnosis not present

## 2021-06-02 DIAGNOSIS — H353112 Nonexudative age-related macular degeneration, right eye, intermediate dry stage: Secondary | ICD-10-CM | POA: Diagnosis not present

## 2021-06-02 DIAGNOSIS — H353221 Exudative age-related macular degeneration, left eye, with active choroidal neovascularization: Secondary | ICD-10-CM | POA: Diagnosis not present

## 2021-06-08 DIAGNOSIS — M6281 Muscle weakness (generalized): Secondary | ICD-10-CM | POA: Diagnosis not present

## 2021-06-08 DIAGNOSIS — M5136 Other intervertebral disc degeneration, lumbar region: Secondary | ICD-10-CM | POA: Diagnosis not present

## 2021-06-08 DIAGNOSIS — R29898 Other symptoms and signs involving the musculoskeletal system: Secondary | ICD-10-CM | POA: Diagnosis not present

## 2021-06-13 ENCOUNTER — Encounter: Payer: Self-pay | Admitting: Dermatology

## 2021-06-13 NOTE — Progress Notes (Signed)
   Follow-Up Visit   Subjective  Brittney Stewart is a 81 y.o. female who presents for the following: Annual Exam (Wants lesions on right arm checked & left side of anus- lesion checked by obgyn and pcp - say its fine ).  Check spot on arm plus several other growths Location:  Duration:  Quality:  Associated Signs/Symptoms: Modifying Factors:  Severity:  Timing: Context:   Objective  Well appearing patient in no apparent distress; mood and affect are within normal limits. Chest - Medial (Center) 1 mm upper dermal hard white papule noninflamed  Left Malar Cheek, Right Malar Cheek 1 mm dermal flesh-colored papules, may better fit syringomas than sebaceous hyperplasia  Right Upper Arm - Anterior Gritty pink hornlike 3 mm crust  Mid Back 5 mm light brown textured flattopped papule, compatible dermoscopy  Right Abdomen (side) - Upper Multiple 1 mm smooth red dermal papules    All skin waist up examined.  Areas beneath undergarment not fully examined.   Assessment & Plan    [place] Chest - Medial [facility])  Can choose to remove in future  Syringoma (2) Left Malar Cheek; Right Malar Cheek  Leave if stable  AK (actinic keratosis) Right Upper Arm - Anterior  Destruction of lesion - Right Upper Arm - Anterior Complexity: simple   Destruction method: cryotherapy   Informed consent: discussed and consent obtained   Timeout:  patient name, date of birth, surgical site, and procedure verified Lesion destroyed using liquid nitrogen: Yes   Cryotherapy cycles:  4 Outcome: patient tolerated procedure well with no complications   Post-procedure details: wound care instructions given    Seborrheic keratosis Mid Back  Leave if stable  Hemangioma of skin Right Abdomen (side) - Upper  No intervention necessary      I, Lavonna Monarch, MD, have reviewed all documentation for this visit.  The documentation on 06/13/21 for the exam, diagnosis, procedures, and orders are all  accurate and complete.

## 2021-06-21 DIAGNOSIS — E782 Mixed hyperlipidemia: Secondary | ICD-10-CM | POA: Diagnosis not present

## 2021-06-21 DIAGNOSIS — R29898 Other symptoms and signs involving the musculoskeletal system: Secondary | ICD-10-CM | POA: Diagnosis not present

## 2021-06-21 DIAGNOSIS — Z136 Encounter for screening for cardiovascular disorders: Secondary | ICD-10-CM | POA: Diagnosis not present

## 2021-06-21 DIAGNOSIS — M5136 Other intervertebral disc degeneration, lumbar region: Secondary | ICD-10-CM | POA: Diagnosis not present

## 2021-06-21 DIAGNOSIS — I10 Essential (primary) hypertension: Secondary | ICD-10-CM | POA: Diagnosis not present

## 2021-06-21 DIAGNOSIS — Z0001 Encounter for general adult medical examination with abnormal findings: Secondary | ICD-10-CM | POA: Diagnosis not present

## 2021-06-21 DIAGNOSIS — M6281 Muscle weakness (generalized): Secondary | ICD-10-CM | POA: Diagnosis not present

## 2021-06-23 DIAGNOSIS — E559 Vitamin D deficiency, unspecified: Secondary | ICD-10-CM | POA: Diagnosis not present

## 2021-06-23 DIAGNOSIS — E785 Hyperlipidemia, unspecified: Secondary | ICD-10-CM | POA: Diagnosis not present

## 2021-06-23 DIAGNOSIS — Z1211 Encounter for screening for malignant neoplasm of colon: Secondary | ICD-10-CM | POA: Diagnosis not present

## 2021-06-23 DIAGNOSIS — I700 Atherosclerosis of aorta: Secondary | ICD-10-CM | POA: Diagnosis not present

## 2021-06-23 DIAGNOSIS — M791 Myalgia, unspecified site: Secondary | ICD-10-CM | POA: Diagnosis not present

## 2021-06-23 DIAGNOSIS — R7303 Prediabetes: Secondary | ICD-10-CM | POA: Diagnosis not present

## 2021-06-23 DIAGNOSIS — Z23 Encounter for immunization: Secondary | ICD-10-CM | POA: Diagnosis not present

## 2021-06-23 DIAGNOSIS — I10 Essential (primary) hypertension: Secondary | ICD-10-CM | POA: Diagnosis not present

## 2021-06-23 DIAGNOSIS — Z13820 Encounter for screening for osteoporosis: Secondary | ICD-10-CM | POA: Diagnosis not present

## 2021-06-23 DIAGNOSIS — Z0001 Encounter for general adult medical examination with abnormal findings: Secondary | ICD-10-CM | POA: Diagnosis not present

## 2021-06-23 DIAGNOSIS — H3530 Unspecified macular degeneration: Secondary | ICD-10-CM | POA: Diagnosis not present

## 2021-06-23 DIAGNOSIS — Z1239 Encounter for other screening for malignant neoplasm of breast: Secondary | ICD-10-CM | POA: Diagnosis not present

## 2021-07-06 DIAGNOSIS — M6281 Muscle weakness (generalized): Secondary | ICD-10-CM | POA: Diagnosis not present

## 2021-07-06 DIAGNOSIS — M5136 Other intervertebral disc degeneration, lumbar region: Secondary | ICD-10-CM | POA: Diagnosis not present

## 2021-07-06 DIAGNOSIS — R29898 Other symptoms and signs involving the musculoskeletal system: Secondary | ICD-10-CM | POA: Diagnosis not present

## 2021-07-13 DIAGNOSIS — M6281 Muscle weakness (generalized): Secondary | ICD-10-CM | POA: Diagnosis not present

## 2021-07-13 DIAGNOSIS — R29898 Other symptoms and signs involving the musculoskeletal system: Secondary | ICD-10-CM | POA: Diagnosis not present

## 2021-07-13 DIAGNOSIS — M5136 Other intervertebral disc degeneration, lumbar region: Secondary | ICD-10-CM | POA: Diagnosis not present

## 2021-07-14 DIAGNOSIS — H353112 Nonexudative age-related macular degeneration, right eye, intermediate dry stage: Secondary | ICD-10-CM | POA: Diagnosis not present

## 2021-07-14 DIAGNOSIS — H353221 Exudative age-related macular degeneration, left eye, with active choroidal neovascularization: Secondary | ICD-10-CM | POA: Diagnosis not present

## 2021-07-18 DIAGNOSIS — E785 Hyperlipidemia, unspecified: Secondary | ICD-10-CM | POA: Diagnosis not present

## 2021-07-18 DIAGNOSIS — I5030 Unspecified diastolic (congestive) heart failure: Secondary | ICD-10-CM | POA: Diagnosis not present

## 2021-07-18 DIAGNOSIS — I10 Essential (primary) hypertension: Secondary | ICD-10-CM | POA: Diagnosis not present

## 2021-07-18 DIAGNOSIS — E559 Vitamin D deficiency, unspecified: Secondary | ICD-10-CM | POA: Diagnosis not present

## 2021-07-20 DIAGNOSIS — M6281 Muscle weakness (generalized): Secondary | ICD-10-CM | POA: Diagnosis not present

## 2021-07-20 DIAGNOSIS — M5136 Other intervertebral disc degeneration, lumbar region: Secondary | ICD-10-CM | POA: Diagnosis not present

## 2021-07-20 DIAGNOSIS — R29898 Other symptoms and signs involving the musculoskeletal system: Secondary | ICD-10-CM | POA: Diagnosis not present

## 2021-07-27 DIAGNOSIS — M6281 Muscle weakness (generalized): Secondary | ICD-10-CM | POA: Diagnosis not present

## 2021-07-27 DIAGNOSIS — M5136 Other intervertebral disc degeneration, lumbar region: Secondary | ICD-10-CM | POA: Diagnosis not present

## 2021-07-27 DIAGNOSIS — R29898 Other symptoms and signs involving the musculoskeletal system: Secondary | ICD-10-CM | POA: Diagnosis not present

## 2021-08-10 DIAGNOSIS — M5136 Other intervertebral disc degeneration, lumbar region: Secondary | ICD-10-CM | POA: Diagnosis not present

## 2021-08-10 DIAGNOSIS — M6281 Muscle weakness (generalized): Secondary | ICD-10-CM | POA: Diagnosis not present

## 2021-08-10 DIAGNOSIS — R29898 Other symptoms and signs involving the musculoskeletal system: Secondary | ICD-10-CM | POA: Diagnosis not present

## 2021-08-25 DIAGNOSIS — H353221 Exudative age-related macular degeneration, left eye, with active choroidal neovascularization: Secondary | ICD-10-CM | POA: Diagnosis not present

## 2021-09-01 DIAGNOSIS — M6281 Muscle weakness (generalized): Secondary | ICD-10-CM | POA: Diagnosis not present

## 2021-09-01 DIAGNOSIS — R29898 Other symptoms and signs involving the musculoskeletal system: Secondary | ICD-10-CM | POA: Diagnosis not present

## 2021-09-01 DIAGNOSIS — M5136 Other intervertebral disc degeneration, lumbar region: Secondary | ICD-10-CM | POA: Diagnosis not present

## 2021-09-15 NOTE — Progress Notes (Signed)
Office Visit Note  Patient: Brittney Stewart             Date of Birth: 16-Aug-1940           MRN: [id number]             PCP: Audley Hose, MD Referring: Audley Hose, MD Visit Date: 09/28/2021 Occupation: '@GUAROCC'$ @  Subjective:  Right forearm pain  History of Present Illness: Gary Bultman is a 81 y.o. female with history of osteoarthritis and DDD. She is taking celebrex 200 mg by mouth daily as needed.  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  She states recently she has been having pain and discomfort in her bilateral forearms more prominent in the right arm.  She continues to have some stiffness in her hands.  She has been going to physical therapy which has helped her with the neck a stiffness and lower extremity muscle strength.  She has been walking on a regular basis for exercise.  Activities of Daily Living:  Patient reports morning stiffness for 1 hour.   Patient Reports nocturnal pain.  Difficulty dressing/grooming: Denies Difficulty climbing stairs: Reports Difficulty getting out of chair: Denies Difficulty using hands for taps, buttons, cutlery, and/or writing: Reports  Review of Systems  Constitutional:  Negative for fatigue.  HENT:  Negative for mouth sores and mouth dryness.   Eyes:  Negative for dryness.  Respiratory:  Negative for shortness of breath.   Cardiovascular:  Negative for chest pain and palpitations.  Gastrointestinal:  Negative for blood in stool, constipation and diarrhea.  Endocrine: Negative for increased urination.  Genitourinary:  Positive for nocturia. Negative for involuntary urination.  Musculoskeletal:  Positive for joint pain, joint pain, myalgias, morning stiffness, muscle tenderness and myalgias. Negative for gait problem, joint swelling and muscle weakness.  Skin:  Negative for color change, rash, hair loss and sensitivity to sunlight.  Allergic/Immunologic: Negative for susceptible to infections.   Neurological:  Negative for dizziness and headaches.  Hematological:  Negative for swollen glands.  Psychiatric/Behavioral:  Positive for sleep disturbance. Negative for depressed mood. The patient is not nervous/anxious.     PMFS History:  Patient Active Problem List   Diagnosis Date Noted   Primary osteoarthritis of both hands 09/28/2021   DDD (degenerative disc disease), cervical 09/21/2017   DDD (degenerative disc disease), lumbar 09/21/2017   Inflamed sebaceous cyst 12/04/2011   Hypertension 12/04/2011   GERD (gastroesophageal reflux disease) 12/04/2011   Allergic rhinitis 12/04/2011   Hyperlipidemia 12/04/2011    Past Medical History:  Diagnosis Date   Allergic rhinitis, cause unspecified    Benign heart murmur    GERD (gastroesophageal reflux disease)    Hypertension    Inflamed sebaceous cyst    Insomnia, unspecified    Macular degeneration, 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   Urine incontinence    Vitamin D deficiency     Family History  Problem Relation Age of Onset   Lung cancer Mother    Arthritis Mother    Hypertension Mother    Hypertension Father    Stroke Father    Diabetes Father    Diabetes Sister    Cancer Sister        brain tumor   Hypertension Sister    Depression Sister    Hypertension Son    Past Surgical History:  Procedure Laterality Date   APPENDECTOMY     BACK SURGERY     CERVICAL SPINE SURGERY  COLONOSCOPY  10/11   dexa scan  02/2010   DIAGNOSTIC MAMMOGRAM  04/2010   PELVIC FRACTURE SURGERY  1968   SHOULDER SURGERY     complete right, partial left   TUMOR REMOVAL     Social History   Social History Narrative   Not on file   Immunization History  Administered Date(s) Administered   Influenza-Unspecified 01/02/2018   PFIZER(Purple Top)SARS-COV-2 Vaccination 01/17/2019, 02/07/2019, 10/30/2019     Objective: Vital Signs: BP 126/75 (BP Location: Right Arm, Patient Position: Sitting, Cuff Size: Normal)   Pulse 60   Resp 15   Ht 5'  6" (1.676 m)   Wt 175 lb 12.8 oz (79.7 kg)   BMI 28.37 kg/m    Physical Exam Vitals and nursing note reviewed.  Constitutional:      Appearance: She is well-developed.  HENT:     Head: Normocephalic and atraumatic.  Eyes:     Conjunctiva/sclera: Conjunctivae normal.  Cardiovascular:     Rate and Rhythm: Normal rate and regular rhythm.     Heart sounds: Normal heart sounds.  Pulmonary:     Effort: Pulmonary effort is normal.     Breath sounds: Normal breath sounds.     Comments: Crackles were audible in bilateral lung bases. Abdominal:     General: Bowel sounds are normal.     Palpations: Abdomen is soft.  Musculoskeletal:     Cervical back: Normal range of motion.  Skin:    General: Skin is warm and dry.     Capillary Refill: Capillary refill takes less than 2 seconds.  Neurological:     Mental Status: She is alert and oriented to person, place, and time.  Psychiatric:        Behavior: Behavior normal.      Musculoskeletal Exam: She had good range of motion of her cervical spine with some limitation on lateral rotation.  She good range of motion of her bilateral shoulder joints with some limitation with range of motion of her left shoulder joint.  Right shoulder joint is replaced.  Elbow joints with good range of motion.  She had tenderness over bilateral lateral epicondyle region.  She had tenderness over right brachioradialis.  Wrist joints and MCP joints are in good range of motion with no synovitis.  She had bilateral PIP and DIP thickening consistent with osteoarthritis, no synovitis was noted.  Hip joints were in good range of motion.  Knee joints were in good range of motion with some stiffness.  There was no tenderness over ankles or MTPs.  CDAI Exam: CDAI Score: -- Patient Global: --; Provider Global: -- Swollen: --; Tender: -- Joint Exam 09/28/2021   No joint exam has been documented for this visit   There is currently no information documented on the  homunculus. Go to the Rheumatology activity and complete the homunculus joint exam.  Investigation: No additional findings.  Imaging: No results found.  Recent Labs: Lab Results  Component Value Date   WBC 4.6 07/02/2016   HGB 13.6 07/02/2016   PLT 147 (L) 07/02/2016   NA 137 07/02/2016   K 4.8 07/02/2016   CL 102 07/02/2016   CO2 28 07/02/2016   GLUCOSE 98 07/02/2016   BUN 18 07/02/2016   CREATININE 0.79 07/02/2016   BILITOT 1.2 02/03/2008   ALKPHOS 69 02/03/2008   AST 23 02/03/2008   ALT 15 02/03/2008   PROT 6.9 01/26/2021   ALBUMIN 3.7 02/03/2008   CALCIUM 9.6 07/02/2016   GFRAA >60  07/02/2016    Speciality Comments: No specialty comments available.  Procedures:  No procedures performed Allergies: Penicillin v potassium   Assessment / Plan:     Visit Diagnoses: Primary osteoarthritis of both hands-she has bilateral PIP and DIP thickening.  No synovitis was noted.  Joint protection muscle strengthening was discussed.  Dupuytren's contracture of right hand-right ring finger unchanged.  H/O total shoulder replacement, right-she had good range of motion without discomfort.  History of left shoulder replacement-she has some discomfort with range of motion and stiffness.  Rheumatoid factor positive - her rheumatoid factor was 92 on January 2023.  She had no synovitis on my examination.  Respiratory crackles of both lungs-crackles were audible in bilateral lung bases.  Patient also has positive rheumatoid factor.  I will obtain chest x-ray PA and lateral.  She denies any shortness of breath.  Lateral epicondylitis of right elbow-she has been playing for recorder and taking lessons.  She has been having discomfort over bilateral lateral epicondyle region.  More prominent on the right side.  Use of topical Voltaren gel was discussed.  I also offered right tennis elbow brace but she would like to hold off.  Primary osteoarthritis of both knees -she continues to have some  stiffness in her knee joints.  No warmth swelling or effusion was noted.  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  No interval changes noted when compared to the x-rays of 2019.  She has been going to physical therapy which has been helpful.  Muscular deconditioning-improved with physical therapy.  Myalgia - she was experiencing myalgias after the COVID-19 infection.  Improved.  Pes planus of both feet-wearing shoes with arch support were advised.  DDD (degenerative disc disease), cervical-she is limitation with lateral rotation.  She has noticed improvement in her cervical pain with physical therapy.  Chronic midline low back pain without sciatica -she continues to have intermittent lower back pain.  X-ray showed multilevel spondylosis facet joint arthropathy and a spondylolisthesis.   DDD (degenerative disc disease), lumbar - Multilevel spondylosis was noted.  L4-L5 significant spondylolisthesis was noted.   Facet joint arthropathy was noted.  She has seen Dr. Ellene Route in the past.   Osteoporosis screening - reviewed DXA 01/23/2019 T score -1.0.  History of vertebral fracture - T9, L1, L2 due to prior injury.  History of gastroesophageal reflux (GERD)  History of hyperlipidemia  History of hypertension  History of seasonal allergies  Orders: Orders Placed This Encounter  Procedures   DG Chest 2 View   No orders of the defined types were placed in this encounter.    Follow-Up Instructions: Return in about 6 months (around 03/29/2022) for Osteoarthritis.   Bo Merino, MD  Note - This record has been created using Editor, commissioning.  Chart creation errors have been sought, but may not always  have been located. Such creation errors do not reflect on  the standard of medical care.

## 2021-09-28 ENCOUNTER — Ambulatory Visit: Payer: Medicare Other | Attending: Physician Assistant | Admitting: Rheumatology

## 2021-09-28 ENCOUNTER — Encounter: Payer: Self-pay | Admitting: Physician Assistant

## 2021-09-28 VITALS — BP 126/75 | HR 60 | Resp 15 | Ht 66.0 in | Wt 175.8 lb

## 2021-09-28 DIAGNOSIS — R0989 Other specified symptoms and signs involving the circulatory and respiratory systems: Secondary | ICD-10-CM | POA: Diagnosis not present

## 2021-09-28 DIAGNOSIS — M2142 Flat foot [pes planus] (acquired), left foot: Secondary | ICD-10-CM | POA: Insufficient documentation

## 2021-09-28 DIAGNOSIS — Z96612 Presence of left artificial shoulder joint: Secondary | ICD-10-CM | POA: Diagnosis not present

## 2021-09-28 DIAGNOSIS — M19041 Primary osteoarthritis, right hand: Secondary | ICD-10-CM | POA: Diagnosis not present

## 2021-09-28 DIAGNOSIS — M720 Palmar fascial fibromatosis [Dupuytren]: Secondary | ICD-10-CM

## 2021-09-28 DIAGNOSIS — Z8719 Personal history of other diseases of the digestive system: Secondary | ICD-10-CM

## 2021-09-28 DIAGNOSIS — M7711 Lateral epicondylitis, right elbow: Secondary | ICD-10-CM | POA: Diagnosis not present

## 2021-09-28 DIAGNOSIS — Z8639 Personal history of other endocrine, nutritional and metabolic disease: Secondary | ICD-10-CM

## 2021-09-28 DIAGNOSIS — Z8679 Personal history of other diseases of the circulatory system: Secondary | ICD-10-CM | POA: Diagnosis not present

## 2021-09-28 DIAGNOSIS — G8929 Other chronic pain: Secondary | ICD-10-CM

## 2021-09-28 DIAGNOSIS — M19042 Primary osteoarthritis, left hand: Secondary | ICD-10-CM | POA: Diagnosis not present

## 2021-09-28 DIAGNOSIS — Z8781 Personal history of (healed) traumatic fracture: Secondary | ICD-10-CM

## 2021-09-28 DIAGNOSIS — Z13820 Encounter for screening for osteoporosis: Secondary | ICD-10-CM | POA: Diagnosis not present

## 2021-09-28 DIAGNOSIS — M5136 Other intervertebral disc degeneration, lumbar region: Secondary | ICD-10-CM | POA: Diagnosis not present

## 2021-09-28 DIAGNOSIS — R768 Other specified abnormal immunological findings in serum: Secondary | ICD-10-CM | POA: Diagnosis not present

## 2021-09-28 DIAGNOSIS — R29898 Other symptoms and signs involving the musculoskeletal system: Secondary | ICD-10-CM | POA: Diagnosis not present

## 2021-09-28 DIAGNOSIS — Z889 Allergy status to unspecified drugs, medicaments and biological substances status: Secondary | ICD-10-CM

## 2021-09-28 DIAGNOSIS — M5030 Other cervical disc degeneration, unspecified cervical region: Secondary | ICD-10-CM

## 2021-09-28 DIAGNOSIS — M2141 Flat foot [pes planus] (acquired), right foot: Secondary | ICD-10-CM

## 2021-09-28 DIAGNOSIS — M51369 Other intervertebral disc degeneration, lumbar region without mention of lumbar back pain or lower extremity pain: Secondary | ICD-10-CM

## 2021-09-28 DIAGNOSIS — Z96611 Presence of right artificial shoulder joint: Secondary | ICD-10-CM | POA: Diagnosis not present

## 2021-09-28 DIAGNOSIS — M170 Bilateral primary osteoarthritis of knee: Secondary | ICD-10-CM | POA: Diagnosis not present

## 2021-09-28 DIAGNOSIS — M791 Myalgia, unspecified site: Secondary | ICD-10-CM

## 2021-09-28 DIAGNOSIS — R7689 Other specified abnormal immunological findings in serum: Secondary | ICD-10-CM

## 2021-09-28 DIAGNOSIS — M545 Low back pain, unspecified: Secondary | ICD-10-CM | POA: Diagnosis not present

## 2021-09-29 ENCOUNTER — Ambulatory Visit (HOSPITAL_COMMUNITY)
Admission: RE | Admit: 2021-09-29 | Discharge: 2021-09-29 | Disposition: A | Discharge status: Home / Self Care | Payer: Medicare Other | Source: Ambulatory Visit | Attending: Rheumatology | Admitting: Rheumatology

## 2021-09-29 DIAGNOSIS — R0989 Other specified symptoms and signs involving the circulatory and respiratory systems: Secondary | ICD-10-CM | POA: Diagnosis not present

## 2021-09-29 DIAGNOSIS — R059 Cough, unspecified: Secondary | ICD-10-CM | POA: Diagnosis not present

## 2021-10-06 DIAGNOSIS — Z23 Encounter for immunization: Secondary | ICD-10-CM | POA: Diagnosis not present

## 2021-10-19 DIAGNOSIS — Z23 Encounter for immunization: Secondary | ICD-10-CM | POA: Diagnosis not present

## 2021-10-19 DIAGNOSIS — E663 Overweight: Secondary | ICD-10-CM | POA: Diagnosis not present

## 2021-10-19 DIAGNOSIS — S8992XA Unspecified injury of left lower leg, initial encounter: Secondary | ICD-10-CM | POA: Diagnosis not present

## 2021-10-19 DIAGNOSIS — Z0001 Encounter for general adult medical examination with abnormal findings: Secondary | ICD-10-CM | POA: Diagnosis not present

## 2021-10-19 DIAGNOSIS — Z13820 Encounter for screening for osteoporosis: Secondary | ICD-10-CM | POA: Diagnosis not present

## 2021-10-19 DIAGNOSIS — I10 Essential (primary) hypertension: Secondary | ICD-10-CM | POA: Diagnosis not present

## 2021-10-19 DIAGNOSIS — H65113 Acute and subacute allergic otitis media (mucoid) (sanguinous) (serous), bilateral: Secondary | ICD-10-CM | POA: Diagnosis not present

## 2021-10-19 DIAGNOSIS — M170 Bilateral primary osteoarthritis of knee: Secondary | ICD-10-CM | POA: Diagnosis not present

## 2021-10-19 DIAGNOSIS — E785 Hyperlipidemia, unspecified: Secondary | ICD-10-CM | POA: Diagnosis not present

## 2021-10-19 DIAGNOSIS — M48061 Spinal stenosis, lumbar region without neurogenic claudication: Secondary | ICD-10-CM | POA: Diagnosis not present

## 2021-10-19 DIAGNOSIS — H9193 Unspecified hearing loss, bilateral: Secondary | ICD-10-CM | POA: Diagnosis not present

## 2021-10-19 DIAGNOSIS — M069 Rheumatoid arthritis, unspecified: Secondary | ICD-10-CM | POA: Diagnosis not present

## 2021-10-20 DIAGNOSIS — H353112 Nonexudative age-related macular degeneration, right eye, intermediate dry stage: Secondary | ICD-10-CM | POA: Diagnosis not present

## 2021-10-20 DIAGNOSIS — H353221 Exudative age-related macular degeneration, left eye, with active choroidal neovascularization: Secondary | ICD-10-CM | POA: Diagnosis not present

## 2021-12-08 DIAGNOSIS — H353221 Exudative age-related macular degeneration, left eye, with active choroidal neovascularization: Secondary | ICD-10-CM | POA: Diagnosis not present

## 2021-12-08 DIAGNOSIS — H353112 Nonexudative age-related macular degeneration, right eye, intermediate dry stage: Secondary | ICD-10-CM | POA: Diagnosis not present

## 2022-02-02 ENCOUNTER — Other Ambulatory Visit (HOSPITAL_COMMUNITY): Payer: Self-pay | Admitting: Cardiology

## 2022-02-02 DIAGNOSIS — R079 Chest pain, unspecified: Secondary | ICD-10-CM

## 2022-02-08 ENCOUNTER — Encounter (HOSPITAL_COMMUNITY)
Admission: RE | Admit: 2022-02-08 | Discharge: 2022-02-08 | Disposition: A | Discharge status: Home / Self Care | Payer: Medicare Other | Source: Ambulatory Visit | Attending: Cardiology | Admitting: Cardiology

## 2022-02-08 DIAGNOSIS — R079 Chest pain, unspecified: Secondary | ICD-10-CM | POA: Diagnosis present

## 2022-02-08 MED ORDER — REGADENOSON 0.4 MG/5ML IV SOLN
0.4000 mg | Freq: Once | INTRAVENOUS | Status: AC
  Administered 2022-02-08: 0.4 mg via INTRAVENOUS

## 2022-02-08 MED ORDER — REGADENOSON 0.4 MG/5ML IV SOLN
INTRAVENOUS | Status: AC
  Filled 2022-02-08: qty 5

## 2022-02-08 MED ORDER — TECHNETIUM TC 99M TETROFOSMIN IV KIT
31.8000 | PACK | Freq: Once | INTRAVENOUS | Status: AC | PRN
  Administered 2022-02-08: 31.8 via INTRAVENOUS

## 2022-02-08 MED ORDER — TECHNETIUM TC 99M TETROFOSMIN IV KIT
10.7000 | PACK | Freq: Once | INTRAVENOUS | Status: AC | PRN
  Administered 2022-02-08: 10.7 via INTRAVENOUS

## 2022-03-28 ENCOUNTER — Ambulatory Visit: Payer: Medicare Other | Admitting: Rheumatology

## 2022-04-14 NOTE — Progress Notes (Signed)
Office Visit Note  Patient: Brittney Stewart             Date of Birth: 04-Jun-1940           MRN: [id number]             PCP: Harvest Forest, MD Referring: Harvest Forest, MD Visit Date: 04/27/2022 Occupation: @  Subjective:  Abdominal discomfort and lower back pain  History of Present Illness: Brittney Stewart is a 82 y.o. female with history of osteoarthritis and degenerative disc disease.  She states last week she sat for several hours with a friend at the hospital.  The next day she started having abdominal discomfort.  She was evaluated by Dr. Isaiah Serge and had a CT scan of the abdomen.  She was diagnosed with diverticulosis.  She states that she will be starting on ciprofloxacin tomorrow as her abdominal discomfort is not better.  She continues to have some discomfort in her hands, shoulders, knee joints and lower back.  She has not noticed any joint swelling.    Activities of Daily Living:  Patient reports morning stiffness for 30 minutes.   Patient Reports nocturnal pain.  Difficulty dressing/grooming: Denies Difficulty climbing stairs: Reports Difficulty getting out of chair: Denies Difficulty using hands for taps, buttons, cutlery, and/or writing: Denies  Review of Systems  Constitutional:  Negative for fatigue.  HENT:  Negative for mouth sores and mouth dryness.   Eyes:  Negative for dryness.  Respiratory:  Negative for shortness of breath.   Cardiovascular:  Negative for chest pain and palpitations.  Gastrointestinal:  Negative for blood in stool, constipation and diarrhea.  Endocrine: Negative for increased urination.  Genitourinary:  Negative for involuntary urination.  Musculoskeletal:  Positive for joint pain, joint pain, myalgias, morning stiffness, muscle tenderness and myalgias. Negative for gait problem, joint swelling and muscle weakness.  Skin:  Negative for color change, rash, hair loss and sensitivity to sunlight.  Allergic/Immunologic: Negative  for susceptible to infections.  Neurological:  Negative for dizziness and headaches.  Hematological:  Negative for swollen glands.  Psychiatric/Behavioral:  Negative for depressed mood and sleep disturbance. The patient is not nervous/anxious.     PMFS History:  Patient Active Problem List   Diagnosis Date Noted   Primary osteoarthritis of both hands 09/28/2021   DDD (degenerative disc disease), cervical 09/21/2017   DDD (degenerative disc disease), lumbar 09/21/2017   Inflamed sebaceous cyst 12/04/2011   Hypertension 12/04/2011   GERD (gastroesophageal reflux disease) 12/04/2011   Allergic rhinitis 12/04/2011   Hyperlipidemia 12/04/2011    Past Medical History:  Diagnosis Date   Allergic rhinitis, cause unspecified    Benign heart murmur    GERD (gastroesophageal reflux disease)    Hypertension    Inflamed sebaceous cyst    Insomnia, unspecified    Macular degeneration, wet    Left eye   Urine incontinence    Vitamin D deficiency     Family History  Problem Relation Age of Onset   Lung cancer Mother    Arthritis Mother    Hypertension Mother    Hypertension Father    Stroke Father    Diabetes Father    Diabetes Sister    Cancer Sister        brain tumor   Hypertension Sister    Depression Sister    Hypertension Son    Past Surgical History:  Procedure Laterality Date   APPENDECTOMY     BACK SURGERY     CERVICAL  SPINE SURGERY     COLONOSCOPY  10/11   dexa scan  02/2010   DIAGNOSTIC MAMMOGRAM  04/2010   PELVIC FRACTURE SURGERY  1968   SHOULDER SURGERY     complete right, partial left   TUMOR REMOVAL     Social History   Social History Narrative   Not on file   Immunization History  Administered Date(s) Administered   Influenza-Unspecified 01/02/2018   PFIZER(Purple Top)SARS-COV-2 Vaccination 01/17/2019, 02/07/2019, 10/30/2019     Objective: Vital Signs: BP 110/71 (BP Location: Left Arm, Patient Position: Sitting, Cuff Size: Normal)   Pulse (!) 56    Resp 14   Ht 5' 5.75" (1.67 m)   Wt 170 lb (77.1 kg)   BMI 27.65 kg/m    Physical Exam Vitals and nursing note reviewed.  Constitutional:      Appearance: She is well-developed.  HENT:     Head: Normocephalic and atraumatic.  Eyes:     Conjunctiva/sclera: Conjunctivae normal.  Cardiovascular:     Rate and Rhythm: Normal rate and regular rhythm.     Heart sounds: Normal heart sounds.  Pulmonary:     Effort: Pulmonary effort is normal.     Breath sounds: Normal breath sounds.  Abdominal:     General: Bowel sounds are normal.     Palpations: Abdomen is soft.  Musculoskeletal:     Cervical back: Normal range of motion.  Lymphadenopathy:     Cervical: No cervical adenopathy.  Skin:    General: Skin is warm and dry.     Capillary Refill: Capillary refill takes less than 2 seconds.  Neurological:     Mental Status: She is alert and oriented to person, place, and time.  Psychiatric:        Behavior: Behavior normal.      Musculoskeletal Exam: Cervical spine was in good range of motion.  She has some limitation with lateral rotation.  She had thoracic kyphosis.  She had discomfort range of motion of her lumbar spine.  Shoulder joints were in good range of motion.  Her right shoulder joint has been replaced.  Elbow joints with good range of motion.  There was no tenderness over lateral epicondyle region.  She had bilateral PIP and DIP thickening.  No synovitis was noted over MCPs or wrist joints.  She had right ring finger Dupuytren's contracture.  Hip joints were in good range of motion.  She had tenderness over left anterior superior iliac spine.  Knee joints were in good range of motion.  She had no tenderness over ankles or MTPs.    CDAI Exam: CDAI Score: -- Patient Global: --; Provider Global: -- Swollen: --; Tender: -- Joint Exam 04/27/2022   No joint exam has been documented for this visit   There is currently no information documented on the homunculus. Go to the  Rheumatology activity and complete the homunculus joint exam.  Investigation: No additional findings.  Imaging: CT ABDOMEN PELVIS W CONTRAST  Result Date: 04/24/2022 CLINICAL DATA:  Left lower quadrant abdominal pain which radiates to the left flank. EXAM: CT ABDOMEN AND PELVIS WITH CONTRAST TECHNIQUE: Multidetector CT imaging of the abdomen and pelvis was performed using the standard protocol following bolus administration of intravenous contrast. RADIATION DOSE REDUCTION: This exam was performed according to the departmental dose-optimization program which includes automated exposure control, adjustment of the mA and/or kV according to patient size and/or use of iterative reconstruction technique. CONTRAST:  OMNIPAQUE IOHEXOL 300 MG/ML  SOLN COMPARISON:  02/03/2008 FINDINGS: Lower chest: No acute abnormality. Hepatobiliary: 1.2 cm left lobe of liver cyst. No suspicious enhancing liver lesions. Gallbladder is normal. No bile duct dilatation. Pancreas: No inflammation or main duct dilatation Small cystic lesion within the head of pancreas measures 1.1 cm, image 56/5 and image 33/2 Spleen: Normal in size without focal abnormality. Adrenals/Urinary Tract: Normal adrenal glands. There are several subcentimeter Bosniak class 2 kidney cysts identified bilaterally. No nephrolithiasis or hydronephrosis identified. No hydroureter or ureteral lithiasis. Urinary bladder appears within normal limits. Stomach/Bowel: Stomach is within normal limits. Appendix appears normal. No evidence of bowel wall thickening, distention, or inflammatory changes. The appendix is not visualized and may be surgically absent. Sigmoid diverticula noted without signs of acute diverticulitis. Vascular/Lymphatic: Aortic atherosclerosis. No aneurysm. Upper abdominal vascularity is patent. No signs of abdominopelvic adenopathy. Reproductive: Uterine calcifications identified. No suspicious mass. Bilateral adnexa are unremarkable. Other: No  ascites or focal fluid collections. No signs of pneumoperitoneum. Musculoskeletal: Remote healed left superior and inferior pubic rami fractures. Status post cerclage wire fixation of the pubic symphysis. Multilevel lumbar degenerative disc disease is identified. Anterolisthesis of L4 on L5 is again identified. Stable chronic anterior superior endplate deformity involving the L1 vertebra. IMPRESSION: 1. No acute findings within the abdomen or pelvis. 2. Sigmoid diverticulosis without signs of acute diverticulitis. 3. Small cystic lesion within the head of pancreas measures 1.1 cm. Follow-up imaging with pancreas protocol MRI or CT in 24 months is advised. This recommendation follows ACR consensus guidelines: Managing Incidental Findings on Abdominal CT: White Paper of the ACR Incidental Findings Committee. J Am Coll Radiol 2010;7:754-773 . 4. Remote healed left superior and inferior pubic rami fractures. 5. Lumbar spondylosis with anterolisthesis of L4 on L5. 6.  Aortic Atherosclerosis (ICD10-I70.0).  The Electronically Signed   By: Signa Kell M.D.   On: 04/24/2022 16:06    Recent Labs: Lab Results  Component Value Date   WBC 4.6 07/02/2016   HGB 13.6 07/02/2016   PLT 147 (L) 07/02/2016   NA 137 07/02/2016   K 4.8 07/02/2016   CL 102 07/02/2016   CO2 28 07/02/2016   GLUCOSE 98 07/02/2016   BUN 18 07/02/2016   CREATININE 0.70 04/24/2022   BILITOT 1.2 02/03/2008   ALKPHOS 69 02/03/2008   AST 23 02/03/2008   ALT 15 02/03/2008   PROT 6.9 01/26/2021   ALBUMIN 3.7 02/03/2008   CALCIUM 9.6 07/02/2016   GFRAA >60 07/02/2016    Speciality Comments: No specialty comments available.  Procedures:  No procedures performed Allergies: Penicillin v potassium   Assessment / Plan:     Visit Diagnoses: Primary osteoarthritis of both hands-bilateral PIP and DIP thickening was noted.  She continues to have some stiffness in her hands but no synovitis was noted.  Joint protection muscle strengthening  was discussed.  Dupuytren's contracture of right hand-ring finger, unchanged  H/O total shoulder replacement, right-she had good range of motion without discomfort.  History of left shoulder replacement-she complains of some discomfort when she sleeps on her side.  She had good range of motion without discomfort on the examination.  Rheumatoid factor positive - her rheumatoid factor was 92 on January 2023.  No synovitis was noted on the examination.  Primary osteoarthritis of both knees-she denies any discomfort today.  She has known history of osteoarthritis and chondromalacia patella.  She had good response to physical therapy.  Pes planus of both feet-use of orthotics was advised.  DDD (degenerative disc disease), cervical-she is some stiffness  in the cervical spine.  She had limited lateral rotation.  DDD (degenerative disc disease), lumbar -she continues to have some lower back pain.  Core strengthening exercises were advised.  Multilevel spondylosis was noted.  L4-L5 significant spondylolisthesis was noted.   Facet joint arthropathy was noted.  She has seen Dr. Danielle Dess in the past.  Chronic midline low back pain without sciatica - X-ray showed multilevel spondylosis facet joint arthropathy and a spondylolisthesis.  Abdominal discomfort-she has been having abdominal discomfort for the last 5 to 6 days.  She was evaluated by Dr. Loreta Ave and had CT scan of the abdomen which showed diverticulosis.  She will be starting Cipro.  She had good range of motion of her left hip joint.  Although she had mild tenderness over anterior superior iliac spine.  Application of heat and diclofenac gel was discussed.  Osteoporosis screening - reviewed DXA 01/23/2019 T score -1.0.  History of vertebral fracture - T9, L1, L2 due to prior injury.  History of hyperlipidemia  History of hypertension-blood pressure was normal at 110/71 today.  History of gastroesophageal reflux (GERD)  History of seasonal  allergies  Orders: No orders of the defined types were placed in this encounter.  No orders of the defined types were placed in this encounter.    Follow-Up Instructions: Return in about 6 months (around 10/27/2022) for Osteoarthritis.   Pollyann Savoy, MD  Note - This record has been created using Animal nutritionist.  Chart creation errors have been sought, but may not always  have been located. Such creation errors do not reflect on  the standard of medical care.

## 2022-04-24 ENCOUNTER — Other Ambulatory Visit (HOSPITAL_COMMUNITY): Payer: Self-pay | Admitting: Gastroenterology

## 2022-04-24 ENCOUNTER — Ambulatory Visit (INDEPENDENT_AMBULATORY_CARE_PROVIDER_SITE_OTHER): Payer: Medicare Other

## 2022-04-24 DIAGNOSIS — R1032 Left lower quadrant pain: Secondary | ICD-10-CM

## 2022-04-24 LAB — I-STAT CREATININE (MANUAL ENTRY): Creatinine, Ser: 0.7 (ref 0.50–1.10)

## 2022-04-24 MED ORDER — IOHEXOL 300 MG/ML  SOLN
100.0000 mL | Freq: Once | INTRAMUSCULAR | Status: AC | PRN
  Administered 2022-04-24: 100 mL via INTRAVENOUS

## 2022-04-27 ENCOUNTER — Encounter: Payer: Self-pay | Admitting: Rheumatology

## 2022-04-27 ENCOUNTER — Ambulatory Visit: Payer: Medicare Other | Attending: Rheumatology | Admitting: Rheumatology

## 2022-04-27 VITALS — BP 110/71 | HR 56 | Resp 14 | Ht 65.75 in | Wt 170.0 lb

## 2022-04-27 DIAGNOSIS — M19041 Primary osteoarthritis, right hand: Secondary | ICD-10-CM

## 2022-04-27 DIAGNOSIS — M2141 Flat foot [pes planus] (acquired), right foot: Secondary | ICD-10-CM

## 2022-04-27 DIAGNOSIS — R768 Other specified abnormal immunological findings in serum: Secondary | ICD-10-CM

## 2022-04-27 DIAGNOSIS — M170 Bilateral primary osteoarthritis of knee: Secondary | ICD-10-CM | POA: Diagnosis present

## 2022-04-27 DIAGNOSIS — Z8679 Personal history of other diseases of the circulatory system: Secondary | ICD-10-CM | POA: Diagnosis present

## 2022-04-27 DIAGNOSIS — Z8719 Personal history of other diseases of the digestive system: Secondary | ICD-10-CM | POA: Diagnosis present

## 2022-04-27 DIAGNOSIS — Z889 Allergy status to unspecified drugs, medicaments and biological substances status: Secondary | ICD-10-CM

## 2022-04-27 DIAGNOSIS — M5030 Other cervical disc degeneration, unspecified cervical region: Secondary | ICD-10-CM

## 2022-04-27 DIAGNOSIS — M720 Palmar fascial fibromatosis [Dupuytren]: Secondary | ICD-10-CM | POA: Diagnosis not present

## 2022-04-27 DIAGNOSIS — Z8639 Personal history of other endocrine, nutritional and metabolic disease: Secondary | ICD-10-CM

## 2022-04-27 DIAGNOSIS — R29898 Other symptoms and signs involving the musculoskeletal system: Secondary | ICD-10-CM

## 2022-04-27 DIAGNOSIS — R109 Unspecified abdominal pain: Secondary | ICD-10-CM | POA: Diagnosis present

## 2022-04-27 DIAGNOSIS — M7711 Lateral epicondylitis, right elbow: Secondary | ICD-10-CM

## 2022-04-27 DIAGNOSIS — M5136 Other intervertebral disc degeneration, lumbar region: Secondary | ICD-10-CM | POA: Insufficient documentation

## 2022-04-27 DIAGNOSIS — M19042 Primary osteoarthritis, left hand: Secondary | ICD-10-CM

## 2022-04-27 DIAGNOSIS — G8929 Other chronic pain: Secondary | ICD-10-CM | POA: Diagnosis present

## 2022-04-27 DIAGNOSIS — Z8781 Personal history of (healed) traumatic fracture: Secondary | ICD-10-CM

## 2022-04-27 DIAGNOSIS — Z13820 Encounter for screening for osteoporosis: Secondary | ICD-10-CM

## 2022-04-27 DIAGNOSIS — Z96611 Presence of right artificial shoulder joint: Secondary | ICD-10-CM

## 2022-04-27 DIAGNOSIS — M545 Low back pain, unspecified: Secondary | ICD-10-CM | POA: Diagnosis present

## 2022-04-27 DIAGNOSIS — Z96612 Presence of left artificial shoulder joint: Secondary | ICD-10-CM

## 2022-04-27 DIAGNOSIS — M51369 Other intervertebral disc degeneration, lumbar region without mention of lumbar back pain or lower extremity pain: Secondary | ICD-10-CM

## 2022-04-27 DIAGNOSIS — R0989 Other specified symptoms and signs involving the circulatory and respiratory systems: Secondary | ICD-10-CM

## 2022-04-27 DIAGNOSIS — M2142 Flat foot [pes planus] (acquired), left foot: Secondary | ICD-10-CM | POA: Diagnosis present

## 2022-04-27 DIAGNOSIS — M791 Myalgia, unspecified site: Secondary | ICD-10-CM

## 2022-05-24 ENCOUNTER — Ambulatory Visit: Payer: Medicare Other | Admitting: Dermatology

## 2022-06-14 ENCOUNTER — Other Ambulatory Visit: Payer: Self-pay | Admitting: Internal Medicine

## 2022-06-14 ENCOUNTER — Encounter: Payer: Self-pay | Admitting: Internal Medicine

## 2022-06-14 DIAGNOSIS — H8111 Benign paroxysmal vertigo, right ear: Secondary | ICD-10-CM

## 2022-06-26 ENCOUNTER — Ambulatory Visit
Admission: RE | Admit: 2022-06-26 | Discharge: 2022-06-26 | Disposition: A | Discharge status: Home / Self Care | Payer: Medicare Other | Source: Ambulatory Visit | Attending: Internal Medicine | Admitting: Internal Medicine

## 2022-06-26 DIAGNOSIS — H8111 Benign paroxysmal vertigo, right ear: Secondary | ICD-10-CM

## 2022-06-26 MED ORDER — GADOPICLENOL 0.5 MMOL/ML IV SOLN
7.5000 mL | Freq: Once | INTRAVENOUS | Status: AC | PRN
  Administered 2022-06-26: 7.5 mL via INTRAVENOUS

## 2022-07-21 ENCOUNTER — Ambulatory Visit: Payer: Medicare Other | Attending: Internal Medicine

## 2022-07-21 DIAGNOSIS — R42 Dizziness and giddiness: Secondary | ICD-10-CM | POA: Insufficient documentation

## 2022-07-28 ENCOUNTER — Ambulatory Visit: Payer: Medicare Other | Admitting: Physical Therapy

## 2022-07-28 ENCOUNTER — Encounter: Payer: Self-pay | Admitting: Physical Therapy

## 2022-07-28 ENCOUNTER — Other Ambulatory Visit: Payer: Self-pay

## 2022-07-28 DIAGNOSIS — R42 Dizziness and giddiness: Secondary | ICD-10-CM

## 2022-07-28 NOTE — Therapy (Addendum)
OUTPATIENT PHYSICAL THERAPY VESTIBULAR EVALUATION     Patient Name: Brittney Stewart MRN: [id number] DOB:07/11/1940, 82 y.o., female Today's Date: 07/28/2022  END OF SESSION:  PT End of Session - 07/28/22 0802     Visit Number 1    Number of Visits 9    Date for PT Re-Evaluation 08/25/22    Authorization Type Medicare-BCBS    PT Start Time 0802    PT Stop Time 0844    PT Time Calculation (min) 42 min    Activity Tolerance Patient tolerated treatment well    Behavior During Therapy WFL for tasks assessed/performed             Past Medical History:  Diagnosis Date   Allergic rhinitis, cause unspecified    Benign heart murmur    GERD (gastroesophageal reflux disease)    Hypertension    Inflamed sebaceous cyst    Insomnia, unspecified    Macular degeneration, wet (HCC)    Left eye   Urine incontinence    Vitamin D deficiency    Past Surgical History:  Procedure Laterality Date   APPENDECTOMY     BACK SURGERY     CERVICAL SPINE SURGERY     COLONOSCOPY  10/11   dexa scan  02/2010   DIAGNOSTIC MAMMOGRAM  04/2010   PELVIC FRACTURE SURGERY  1968   SHOULDER SURGERY     complete right, partial left   TUMOR REMOVAL     Patient Active Problem List   Diagnosis Date Noted   Primary osteoarthritis of both hands 09/28/2021   DDD (degenerative disc disease), cervical 09/21/2017   DDD (degenerative disc disease), lumbar 09/21/2017   Inflamed sebaceous cyst 12/04/2011   Hypertension 12/04/2011   GERD (gastroesophageal reflux disease) 12/04/2011   Allergic rhinitis 12/04/2011   Hyperlipidemia 12/04/2011    PCP: Harvest Forest, MD  REFERRING PROVIDER: Harvest Forest, MD   REFERRING DIAG: H81.10 (ICD-10-CM) - Benign paroxysmal positional vertigo   THERAPY DIAG:  Dizziness and giddiness  ONSET DATE: 06/14/2022 (MD referral)  Rationale for Evaluation and Treatment: Rehabilitation  SUBJECTIVE:   SUBJECTIVE STATEMENT: For a while, had some episodes of vertigo  when I lie down at night.  Don't notice it often. Just a bit of uneasiness.  Do the moves that Dr. Corky Downs wanted me to do. Pt accompanied by: self  PERTINENT HISTORY: OA, R and L shoulder replacement, DDD cervical and lumbar spine  PAIN:  Are you having pain? Yes: NPRS scale: not rated/10 Pain location: knees, joints Pain description: arthritis Aggravating factors: most days have pain Relieving factors: hot shower  PRECAUTIONS: None  RED FLAGS: None   WEIGHT BEARING RESTRICTIONS: No  FALLS: Has patient fallen in last 6 months? No  LIVING ENVIRONMENT: Lives with: lives with their spouse Lives in: House/apartment Stairs: Yes: Internal: 4 steps; can reach both Has following equipment at home: None  PLOF: Independentenjoys performing in recorder groups-lots of sitting for practice and playing  PATIENT GOALS: To see what is causing the dizziness and get rid of it.  OBJECTIVE:   DIAGNOSTIC FINDINGS: No abnormal MRI findings  COGNITION: Overall cognitive status: Within functional limits for tasks assessed   Cervical ROM:    Active A/PROM (deg) eval  Flexion 50  Extension 21  Right lateral flexion   Left lateral flexion   Right rotation 50  Left rotation 45  (Blank rows = not tested)   TRANSFERS: Assistive device utilized: None  Sit to stand: Complete Independence Stand to sit:  Complete Independence PATIENT SURVEYS:  FOTO 56 intake; predicted 65  VESTIBULAR ASSESSMENT:  GENERAL OBSERVATION: No acute distress   SYMPTOM BEHAVIOR:  Subjective history: Vertigo symptoms dizziness with getting into the bed, which started about 2 months ago.  Had shingles, diverticulitis several months ago. 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, wet macular degeneration R; followed at [facility]  Non-Vestibular symptoms: changes in vision  Type of dizziness: Spinning/Vertigo and "Funny feeling in the head"  Frequency: most times with lying down  Duration:  seconds  Aggravating factors: Induced by position change: lying supine  Relieving factors: head stationary and slow movements  Progression of symptoms: better  OCULOMOTOR EXAM:  Ocular Alignment: abnormal R eye lower than L  Ocular ROM: No Limitations  Spontaneous Nystagmus:  at end ranges looking R and looking L  Gaze-Induced Nystagmus:  end ranges  Smooth Pursuits: intact  Saccades: extra eye movements     VESTIBULAR - OCULAR REFLEX:   Slow VOR: Normal  VOR Cancellation: Normal  Head-Impulse Test: HIT Right: negative HIT Left: positive  Dynamic Visual Acuity: Not able to be assessed   POSITIONAL TESTING: Right Dix-Hallpike: upbeating, right nystagmus and Duration: 5-10 sec Left Dix-Hallpike: no nystagmus Right Roll Test: no nystagmus Left Roll Test: no nystagmus  MOTION SENSITIVITY:  Motion Sensitivity Quotient Intensity: 0 = none, 1 = Lightheaded, 2 = Mild, 3 = Moderate, 4 = Severe, 5 = Vomiting  Intensity  1. Sitting to supine   2. Supine to L side   3. Supine to R side   4. Supine to sitting   5. L Hallpike-Dix   6. Up from L    7. R Hallpike-Dix   8. Up from R    9. Sitting, head tipped to L knee   10. Head up from L knee   11. Sitting, head tipped to R knee   12. Head up from R knee   13. Sitting head turns x5   14.Sitting head nods x5   15. In stance, 180 turn to L    16. In stance, 180 turn to R       VESTIBULAR TREATMENT:                                                                                                   DATE: 07/28/2022  Canalith Repositioning:  Epley Right: Number of Reps: 1, Response to Treatment: symptoms improved, and Comment: pt tolerates well  PATIENT EDUCATION: Education details: Eval results, POC, BPPV treatment rationale Person educated: Patient Education method: Explanation Education comprehension: verbalized understanding  HOME EXERCISE PROGRAM:  GOALS: Goals reviewed with patient? Yes   LONG TERM GOALS: Target  date: 08/25/2022  Pt will be independent with HEP for improved dizziness. Baseline:  Goal status: INITIAL  2.  Pt will report 0/10 dizziness with bed mobility. Baseline:  Goal status: INITIAL  3.  FOTO score to improve to 65 Baseline: 56 Goal status: INITIAL   ASSESSMENT:  CLINICAL IMPRESSION: Patient is a 82 y.o. female who was seen today for physical therapy evaluation and treatment  for BPPV.  She presents with approximately 2 month history of dizziness, spinning, vertigo sensation upon getting in bed.  She reports it only lasts seconds.  She does not report any balance changes.  She has hx of macular degeneration both eyes; she does have nystagmus at end ranges with smooth pursuits.  With BPPV testing, she has positive upbeating rotary nystagmus with R Dix Hallpike, indicating R posterior canalithiasis.  Treated with Epley maneuver x 1, and with further [facility] testing, pt has no nystagmus.  She will benefit from additional follow up visits to further reassess and treat positional vertigo to improve functional mobility and decrease dizziness with bed mobility.  OBJECTIVE IMPAIRMENTS: dizziness.   ACTIVITY LIMITATIONS: bed mobility  PARTICIPATION LIMITATIONS:  NA  PERSONAL FACTORS: 3+ comorbidities: see PMH above  are also affecting patient's functional outcome.   REHAB POTENTIAL: Good  CLINICAL DECISION MAKING: Stable/uncomplicated  EVALUATION COMPLEXITY: Low   PLAN:  PT FREQUENCY: 2x/week  PT DURATION: 4 weeks plus eval  PLANNED INTERVENTIONS: Therapeutic exercises, Therapeutic activity, Neuromuscular re-education, Balance training, Patient/Family education, Self Care, Vestibular training, and Canalith repositioning  PLAN FOR NEXT SESSION: Reassess for positional vertigo and treat as appropriate.   Gean Maidens., PT 07/28/2022, 8:55 AM   [facility] [address] [place], [address] [place], [place] [address] Phone # [phone] Fax # [phone]

## 2022-08-07 ENCOUNTER — Ambulatory Visit: Payer: Medicare Other | Attending: Internal Medicine | Admitting: Physical Therapy

## 2022-08-07 ENCOUNTER — Encounter: Payer: Self-pay | Admitting: Physical Therapy

## 2022-08-07 DIAGNOSIS — R42 Dizziness and giddiness: Secondary | ICD-10-CM | POA: Diagnosis not present

## 2022-08-07 NOTE — Therapy (Signed)
OUTPATIENT PHYSICAL THERAPY VESTIBULAR TREATMENT NOTE     Patient Name: Brittney Stewart MRN: [id number] DOB:12/20/1940, 82 y.o., female Today's Date: 08/07/2022  END OF SESSION:  PT End of Session - 08/07/22 0848     Visit Number 2    Number of Visits 9    Date for PT Re-Evaluation 08/25/22    Authorization Type Medicare-BCBS    PT Start Time 0849    PT Stop Time 0901    PT Time Calculation (min) 12 min    Activity Tolerance Patient tolerated treatment well    Behavior During Therapy WFL for tasks assessed/performed              Past Medical History:  Diagnosis Date   Allergic rhinitis, cause unspecified    Benign heart murmur    GERD (gastroesophageal reflux disease)    Hypertension    Inflamed sebaceous cyst    Insomnia, unspecified    Macular degeneration, wet (HCC)    Left eye   Urine incontinence    Vitamin D deficiency    Past Surgical History:  Procedure Laterality Date   APPENDECTOMY     BACK SURGERY     CERVICAL SPINE SURGERY     COLONOSCOPY  10/11   dexa scan  02/2010   DIAGNOSTIC MAMMOGRAM  04/2010   PELVIC FRACTURE SURGERY  1968   SHOULDER SURGERY     complete right, partial left   TUMOR REMOVAL     Patient Active Problem List   Diagnosis Date Noted   Primary osteoarthritis of both hands 09/28/2021   DDD (degenerative disc disease), cervical 09/21/2017   DDD (degenerative disc disease), lumbar 09/21/2017   Inflamed sebaceous cyst 12/04/2011   Hypertension 12/04/2011   GERD (gastroesophageal reflux disease) 12/04/2011   Allergic rhinitis 12/04/2011   Hyperlipidemia 12/04/2011    PCP: Harvest Forest, MD  REFERRING PROVIDER: Harvest Forest, MD   REFERRING DIAG: H81.10 (ICD-10-CM) - Benign paroxysmal positional vertigo   THERAPY DIAG:  Dizziness and giddiness  ONSET DATE: 06/14/2022 (MD referral)  Rationale for Evaluation and Treatment: Rehabilitation  SUBJECTIVE:   SUBJECTIVE STATEMENT: Have not had any more episodes with  the dizziness. Pt accompanied by: self  PERTINENT HISTORY: OA, R and L shoulder replacement, DDD cervical and lumbar spine  PAIN:  Are you having pain? Yes: NPRS scale: not rated/10 Pain location: knees, joints Pain description: arthritis Aggravating factors: most days have pain Relieving factors: hot shower  PRECAUTIONS: None  RED FLAGS: None   WEIGHT BEARING RESTRICTIONS: No  FALLS: Has patient fallen in last 6 months? No  LIVING ENVIRONMENT: Lives with: lives with their spouse Lives in: House/apartment Stairs: Yes: Internal: 4 steps; can reach both Has following equipment at home: None  PLOF: Independentenjoys performing in recorder groups-lots of sitting for practice and playing  PATIENT GOALS: To see what is causing the dizziness and get rid of it.  OBJECTIVE:    TODAY'S TREATMENT: 08/07/2022 Discussed vertigo symptoms.  Pt has had no symptoms since previous treatment, following Epley maneuver.  No follow up HEP needed.  FOTO:  score 67  PATIENT EDUCATION: Education details: progress towards goals, resolved vertigo symptoms, POC-will hold chart open for 30 days in case vertigo returns; will discharge chart if no other PT needs after that point Person educated: Patient Education method: Explanation Education comprehension: verbalized understanding   ------------------------------------------------------------------------- Objective measures below taken at initial evaluation:  DIAGNOSTIC FINDINGS: No abnormal MRI findings  COGNITION: Overall cognitive status: Within functional limits  for tasks assessed   Cervical ROM:    Active A/PROM (deg) eval  Flexion 50  Extension 21  Right lateral flexion   Left lateral flexion   Right rotation 50  Left rotation 45  (Blank rows = not tested)   TRANSFERS: Assistive device utilized: None  Sit to stand: Complete Independence Stand to sit: Complete Independence PATIENT SURVEYS:  FOTO 56 intake; predicted  65  VESTIBULAR ASSESSMENT:  GENERAL OBSERVATION: No acute distress   SYMPTOM BEHAVIOR:  Subjective history: Vertigo symptoms dizziness with getting into the bed, which started about 2 months ago.  Had shingles, diverticulitis several months ago. 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, wet macular degeneration R; followed at [facility]  Non-Vestibular symptoms: changes in vision  Type of dizziness: Spinning/Vertigo and "Funny feeling in the head"  Frequency: most times with lying down  Duration: seconds  Aggravating factors: Induced by position change: lying supine  Relieving factors: head stationary and slow movements  Progression of symptoms: better  OCULOMOTOR EXAM:  Ocular Alignment: abnormal R eye lower than L  Ocular ROM: No Limitations  Spontaneous Nystagmus:  at end ranges looking R and looking L  Gaze-Induced Nystagmus:  end ranges  Smooth Pursuits: intact  Saccades: extra eye movements     VESTIBULAR - OCULAR REFLEX:   Slow VOR: Normal  VOR Cancellation: Normal  Head-Impulse Test: HIT Right: negative HIT Left: positive  Dynamic Visual Acuity: Not able to be assessed   POSITIONAL TESTING: Right Dix-Hallpike: upbeating, right nystagmus and Duration: 5-10 sec Left Dix-Hallpike: no nystagmus Right Roll Test: no nystagmus Left Roll Test: no nystagmus  MOTION SENSITIVITY:  Motion Sensitivity Quotient Intensity: 0 = none, 1 = Lightheaded, 2 = Mild, 3 = Moderate, 4 = Severe, 5 = Vomiting  Intensity  1. Sitting to supine   2. Supine to L side   3. Supine to R side   4. Supine to sitting   5. L Hallpike-Dix   6. Up from L    7. R Hallpike-Dix   8. Up from R    9. Sitting, head tipped to L knee   10. Head up from L knee   11. Sitting, head tipped to R knee   12. Head up from R knee   13. Sitting head turns x5   14.Sitting head nods x5   15. In stance, 180 turn to L    16. In stance, 180 turn to R       VESTIBULAR TREATMENT:                                                                                                    DATE: 07/28/2022  Canalith Repositioning:  Epley Right: Number of Reps: 1, Response to Treatment: symptoms improved, and Comment: pt tolerates well  PATIENT EDUCATION: Education details: Eval results, POC, BPPV treatment rationale Person educated: Patient Education method: Explanation Education comprehension: verbalized understanding  HOME EXERCISE PROGRAM:  GOALS: Goals reviewed with patient? Yes   LONG TERM GOALS: Target date: 08/25/2022  Pt will be independent with HEP  for improved dizziness. Baseline:  Goal status: DEFERRED, no HEP needed  2.  Pt will report 0/10 dizziness with bed mobility. Baseline: reports no dizziness Goal status: MET  3.  FOTO score to improve to 65 Baseline: 56>67 08/07/2022 Goal status: INITIAL   ASSESSMENT:  CLINICAL IMPRESSION: Pt returns today for second PT visit.  Pt reports no vertigo or dizziness since eval and CRM (Epley).  She denies dizziness with any bed mobility, rolling, or any other activities, so BPPV appears to have resolved at this time.  She completed FOTO today and is in agreement with keeping chart open in case she has other vertigo symptoms in next month.  With no c/o dizziness, did not need to initiate HEP or reassess for positional vertigo today.     OBJECTIVE IMPAIRMENTS: dizziness.   ACTIVITY LIMITATIONS: bed mobility  PARTICIPATION LIMITATIONS:  NA  PERSONAL FACTORS: 3+ comorbidities: see PMH above  are also affecting patient's functional outcome.   REHAB POTENTIAL: Good  CLINICAL DECISION MAKING: Stable/uncomplicated  EVALUATION COMPLEXITY: Low   PLAN:  PT FREQUENCY: 2x/week  PT DURATION: 4 weeks plus eval  PLANNED INTERVENTIONS: Therapeutic exercises, Therapeutic activity, Neuromuscular re-education, Balance training, Patient/Family education, Self Care, Vestibular training, and Canalith repositioning  PLAN FOR NEXT SESSION: Hold  chart open x 30 days; will discharge at that point if no further PT needs.  Gean Maidens., PT 08/07/2022, 9:09 AM   [facility] [address] [place], [address] [place], [place] [address] Phone # [phone] Fax # [phone]

## 2022-08-11 ENCOUNTER — Ambulatory Visit: Payer: Medicare Other | Admitting: Physical Therapy

## 2022-08-17 ENCOUNTER — Telehealth: Payer: Self-pay | Admitting: *Deleted

## 2022-08-17 NOTE — Telephone Encounter (Signed)
Received DEXA results from [facility].  Date of Scan: 08/14/2022  Lowest T-score:-2.0  BMD:0.626  Lowest site measured:Left Femoral Neck  DX: Osteopenia  Significant changes in BMD and site measured (5% and above):-7% Right Total Femur  Current Regimen: Calcium, Vitamin D  Recommendation:Discuss at the follow up visit   Reviewed by:Dr. Pollyann Savoy   Next Appointment:  10/25/2022

## 2022-10-12 NOTE — Progress Notes (Signed)
Office Visit Note  Patient: Brittney Stewart             Date of Birth: [birth year]           MRN: [id number]             PCP: Harvest Forest, MD Referring: Harvest Forest, MD Visit Date: 10/25/2022 Occupation: @GUAROCC @  Subjective:  Pain in both hands  History of Present Illness: Brittney Stewart is a 82 y.o. female with osteoarthritis and degenerative disc disease.  She returns today after her last visit in April 2024.  She states she continues to have pain and discomfort in her bilateral hands.  She has been taking recorder lessons which is causing discomfort in her bilateral CMC joints.  She has stiffness in her hands.  She has been using a [employer] which helps to some extent.  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  She has difficulty sleeping on the left side.  She has intermittent discomfort in her knee joints but she has not noticed any joint swelling.  She continues to have neck and lower back pain.    Activities of Daily Living:  Patient reports morning stiffness for 30-45 minutes.   Patient Reports nocturnal pain.  Difficulty dressing/grooming: Denies Difficulty climbing stairs: Reports Difficulty getting out of chair: Reports Difficulty using hands for taps, buttons, cutlery, and/or writing: Denies  Review of Systems  Constitutional:  Negative for fatigue.  HENT:  Negative for mouth sores and mouth dryness.   Eyes:  Negative for dryness.  Respiratory:  Negative for shortness of breath.   Cardiovascular:  Negative for chest pain and palpitations.  Gastrointestinal:  Negative for blood in stool, constipation and diarrhea.  Endocrine: Negative for increased urination.  Genitourinary:  Positive for involuntary urination.  Musculoskeletal:  Positive for joint pain, joint pain, myalgias, muscle weakness, morning stiffness, muscle tenderness and myalgias. Negative for gait problem and joint swelling.  Skin:  Negative for  color change, rash, hair loss and sensitivity to sunlight.  Allergic/Immunologic: Negative for susceptible to infections.  Neurological:  Positive for numbness. Negative for dizziness and headaches.  Hematological:  Negative for swollen glands.  Psychiatric/Behavioral:  Positive for sleep disturbance. Negative for depressed mood. The patient is not nervous/anxious.     PMFS History:  Patient Active Problem List   Diagnosis Date Noted   Primary osteoarthritis of both hands 09/28/2021   DDD (degenerative disc disease), cervical 09/21/2017   DDD (degenerative disc disease), lumbar 09/21/2017   Inflamed sebaceous cyst 12/04/2011   Hypertension 12/04/2011   GERD (gastroesophageal reflux disease) 12/04/2011   Allergic rhinitis 12/04/2011   Hyperlipidemia 12/04/2011    Past Medical History:  Diagnosis Date   Allergic rhinitis, cause unspecified    Benign heart murmur    GERD (gastroesophageal reflux disease)    Hypertension    Inflamed sebaceous cyst    Insomnia, unspecified    Macular degeneration, wet (HCC)    Left eye   Urine incontinence    Vitamin D deficiency     Family History  Problem Relation Age of Onset   Lung cancer Mother    Arthritis Mother    Hypertension Mother    Hypertension Father    Stroke Father    Diabetes Father    Diabetes Sister    Cancer Sister        brain tumor   Hypertension Sister    Depression Sister    Hypertension  Son    Past Surgical History:  Procedure Laterality Date   APPENDECTOMY     BACK SURGERY     CERVICAL SPINE SURGERY     COLONOSCOPY  10/11   dexa scan  02/2010   DIAGNOSTIC MAMMOGRAM  04/2010   PELVIC FRACTURE SURGERY  1968   SHOULDER SURGERY     complete right, partial left   TUMOR REMOVAL     Social History   Social History Narrative   Not on file   Immunization History  Administered Date(s) Administered   Influenza-Unspecified 01/02/2018   PFIZER(Purple Top)SARS-COV-2 Vaccination 01/17/2019, 02/07/2019,  10/30/2019     Objective: Vital Signs: BP 128/79 (BP Location: Right Arm, Patient Position: Sitting, Cuff Size: Normal)   Pulse (!) 57   Resp 15   Ht 5' 5.75" (1.67 m)   Wt 175 lb 12.8 oz (79.7 kg)   BMI 28.59 kg/m    Physical Exam   Musculoskeletal Exam: She had good range of motion of the cervical spine without discomfort.  She had thoracic kyphosis.  She had discomfort range of motion of the lumbar spine without any point tenderness.  Right shoulder joint was in good range of motion which was replaced.  Left shoulder had limited abduction.  Hip joints and knee joints in good range of motion.  No warmth swelling or effusion was noted.  She had no tenderness over ankles or MTPs.  CDAI Exam: CDAI Score: -- Patient Global: --; Provider Global: -- Swollen: --; Tender: -- Joint Exam 10/25/2022   No joint exam has been documented for this visit   There is currently no information documented on the homunculus. Go to the Rheumatology activity and complete the homunculus joint exam.  Investigation: No additional findings.  Imaging: No results found.  Recent Labs: Lab Results  Component Value Date   WBC 4.6 07/02/2016   HGB 13.6 07/02/2016   PLT 147 (L) 07/02/2016   NA 137 07/02/2016   K 4.8 07/02/2016   CL 102 07/02/2016   CO2 28 07/02/2016   GLUCOSE 98 07/02/2016   BUN 18 07/02/2016   CREATININE 0.70 04/24/2022   BILITOT 1.2 02/03/2008   ALKPHOS 69 02/03/2008   AST 23 02/03/2008   ALT 15 02/03/2008   PROT 6.9 01/26/2021   ALBUMIN 3.7 02/03/2008   CALCIUM 9.6 07/02/2016   GFRAA >60 07/02/2016     Speciality Comments: No specialty comments available.  Procedures:  No procedures performed Allergies: Penicillin v potassium   Assessment / Plan:     Visit Diagnoses: Primary osteoarthritis of both hands-she continues to have pain and discomfort in the bilateral hands.  Bilateral CMC, PIP and DIP thickening was noted.  No synovitis was noted.  She has been having  increased discomfort in her [facility] joint since she has been playing three-quarter.  I gave her a prescription for bilateral CMC braces.  Topical Voltaren gel was advised.  Handout on hand exercises was given.  I will also refer her to occupational therapy.  Natural anti-inflammatories were discussed.  Dupuytren's contracture of right hand-asymptomatic.  H/O total shoulder replacement, right-she had good range of motion without discomfort.  History of left shoulder replacement-he had limited abduction without discomfort.  Patient complains of some nocturnal pain in her left shoulder.  Rheumatoid factor positive - her rheumatoid factor was 92 on January 2023.  No synovitis was noted.  Primary osteoarthritis of both knees - She has known history of osteoarthritis and chondromalacia patella.  Previous x-rays were reviewed.  She has intermittent discomfort.  Lower extremity muscle strengthening exercises were discussed.  Pes planus of both feet-arch support was advised.  DDD (degenerative disc disease), cervical-she had limited range of motion without discomfort.  Degeneration of intervertebral disc of lumbar region, unspecified whether pain present -she continues to have intermittent discomfort.  Multilevel spondylosis was noted.  L4-L5 significant spondylolisthesis was noted.   Facet joint arthropathy was noted.  She has seen Dr. Danielle Dess in the past.  She continues to take Celebrex and Tylenol for chronic pain.  I advised her to get CBC and CMP with her PCP.  Chronic midline low back pain without sciatica -chronic pain.  X-ray showed multilevel spondylosis facet joint arthropathy and a spondylolisthesis.  Osteoporosis screening - August 14, 2022 T-score -2.0, BMD 0.626 left femoral neck -3% change from 2021.  DEXA scan findings were reviewed with the patient.  I discussed the option of bisphosphonate which she declined.  Side effects were discussed at length.  She will continue calcium and vitamin D and  resistive exercises.  Will repeat DEXA scan in 2 years.  History of vertebral fracture - T9, L1, L2 due to prior injury.  History of hyperlipidemia  History of hypertension-blood pressure was normal today.  History of seasonal allergies  History of gastroesophageal reflux (GERD)  Orders: No orders of the defined types were placed in this encounter.  No orders of the defined types were placed in this encounter.    Follow-Up Instructions: Return in about 1 year (around 10/25/2023) for Osteoarthritis.   Brittney Savoy, MD  Note - This record has been created using Animal nutritionist.  Chart creation errors have been sought, but may not always  have been located. Such creation errors do not reflect on  the standard of medical care.

## 2022-10-25 ENCOUNTER — Encounter: Payer: Self-pay | Admitting: Rheumatology

## 2022-10-25 ENCOUNTER — Ambulatory Visit: Payer: Medicare Other | Attending: Rheumatology | Admitting: Rheumatology

## 2022-10-25 VITALS — BP 128/79 | HR 57 | Resp 15 | Ht 65.75 in | Wt 175.8 lb

## 2022-10-25 DIAGNOSIS — R768 Other specified abnormal immunological findings in serum: Secondary | ICD-10-CM

## 2022-10-25 DIAGNOSIS — M19042 Primary osteoarthritis, left hand: Secondary | ICD-10-CM

## 2022-10-25 DIAGNOSIS — M19041 Primary osteoarthritis, right hand: Secondary | ICD-10-CM | POA: Diagnosis present

## 2022-10-25 DIAGNOSIS — Z8639 Personal history of other endocrine, nutritional and metabolic disease: Secondary | ICD-10-CM | POA: Diagnosis present

## 2022-10-25 DIAGNOSIS — Z8679 Personal history of other diseases of the circulatory system: Secondary | ICD-10-CM

## 2022-10-25 DIAGNOSIS — Z8719 Personal history of other diseases of the digestive system: Secondary | ICD-10-CM

## 2022-10-25 DIAGNOSIS — M720 Palmar fascial fibromatosis [Dupuytren]: Secondary | ICD-10-CM

## 2022-10-25 DIAGNOSIS — Z96612 Presence of left artificial shoulder joint: Secondary | ICD-10-CM | POA: Diagnosis present

## 2022-10-25 DIAGNOSIS — M170 Bilateral primary osteoarthritis of knee: Secondary | ICD-10-CM | POA: Diagnosis present

## 2022-10-25 DIAGNOSIS — M2142 Flat foot [pes planus] (acquired), left foot: Secondary | ICD-10-CM | POA: Insufficient documentation

## 2022-10-25 DIAGNOSIS — M5030 Other cervical disc degeneration, unspecified cervical region: Secondary | ICD-10-CM

## 2022-10-25 DIAGNOSIS — Z13820 Encounter for screening for osteoporosis: Secondary | ICD-10-CM | POA: Diagnosis present

## 2022-10-25 DIAGNOSIS — M2141 Flat foot [pes planus] (acquired), right foot: Secondary | ICD-10-CM

## 2022-10-25 DIAGNOSIS — M51369 Other intervertebral disc degeneration, lumbar region without mention of lumbar back pain or lower extremity pain: Secondary | ICD-10-CM

## 2022-10-25 DIAGNOSIS — Z96611 Presence of right artificial shoulder joint: Secondary | ICD-10-CM

## 2022-10-25 DIAGNOSIS — Z8781 Personal history of (healed) traumatic fracture: Secondary | ICD-10-CM

## 2022-10-25 DIAGNOSIS — Z889 Allergy status to unspecified drugs, medicaments and biological substances status: Secondary | ICD-10-CM | POA: Diagnosis present

## 2022-10-25 DIAGNOSIS — M545 Low back pain, unspecified: Secondary | ICD-10-CM

## 2022-10-25 DIAGNOSIS — G8929 Other chronic pain: Secondary | ICD-10-CM | POA: Insufficient documentation

## 2022-10-25 DIAGNOSIS — R109 Unspecified abdominal pain: Secondary | ICD-10-CM

## 2022-10-25 LAB — LAB REPORT - SCANNED: EGFR: 84

## 2022-10-25 NOTE — Patient Instructions (Signed)

## 2022-10-26 ENCOUNTER — Telehealth: Payer: Self-pay | Admitting: *Deleted

## 2022-10-26 NOTE — Telephone Encounter (Signed)
Labs received from:[facility]  Drawn on:10/24/2022  Reviewed by:Dr. Pollyann Savoy   Labs drawn: Lipid Panel, CMP  Results: HDL Cholesterol 41    Triglycerides 178

## 2022-11-22 ENCOUNTER — Other Ambulatory Visit: Payer: Self-pay

## 2022-11-22 ENCOUNTER — Ambulatory Visit: Payer: Medicare Other | Attending: Internal Medicine | Admitting: Occupational Therapy

## 2022-11-22 DIAGNOSIS — M19042 Primary osteoarthritis, left hand: Secondary | ICD-10-CM | POA: Diagnosis not present

## 2022-11-22 DIAGNOSIS — M19041 Primary osteoarthritis, right hand: Secondary | ICD-10-CM | POA: Insufficient documentation

## 2022-11-22 DIAGNOSIS — M25541 Pain in joints of right hand: Secondary | ICD-10-CM | POA: Insufficient documentation

## 2022-11-22 DIAGNOSIS — M25542 Pain in joints of left hand: Secondary | ICD-10-CM | POA: Diagnosis present

## 2022-11-22 NOTE — Therapy (Signed)
OUTPATIENT OCCUPATIONAL THERAPY ORTHO EVALUATION  Patient Name: Brittney Stewart MRN: [id number] DOB:18-May-1940, 82 y.o., female Today's Date: 11/22/2022  PCP: Harvest Forest, MD REFERRING PROVIDER: Pollyann Savoy, MD  END OF SESSION:  OT End of Session - 11/22/22 1638     Visit Number 1    Number of Visits 5    Date for OT Re-Evaluation 01/17/23    Authorization Type Medicare A&B, BSCS Supplemental    OT Start Time 1147    OT Stop Time 1229    OT Time Calculation (min) 42 min    Behavior During Therapy WFL for tasks assessed/performed             Past Medical History:  Diagnosis Date   Allergic rhinitis, cause unspecified    Benign heart murmur    GERD (gastroesophageal reflux disease)    Hypertension    Inflamed sebaceous cyst    Insomnia, unspecified    Macular degeneration, wet (HCC)    Left eye   Urine incontinence    Vitamin D deficiency    Past Surgical History:  Procedure Laterality Date   APPENDECTOMY     BACK SURGERY     CERVICAL SPINE SURGERY     COLONOSCOPY  10/11   dexa scan  02/2010   DIAGNOSTIC MAMMOGRAM  04/2010   PELVIC FRACTURE SURGERY  1968   SHOULDER SURGERY     complete right, partial left   TUMOR REMOVAL     Patient Active Problem List   Diagnosis Date Noted   Primary osteoarthritis of both hands 09/28/2021   DDD (degenerative disc disease), cervical 09/21/2017   DDD (degenerative disc disease), lumbar 09/21/2017   Inflamed sebaceous cyst 12/04/2011   Hypertension 12/04/2011   GERD (gastroesophageal reflux disease) 12/04/2011   Allergic rhinitis 12/04/2011   Hyperlipidemia 12/04/2011    ONSET DATE: referral date 10/25/22  REFERRING DIAG: M19.041,M19.042 (ICD-10-CM) - Primary osteoarthritis of both hands  THERAPY DIAG:  Pain in joint of left hand  Pain in joint of right hand  Rationale for Evaluation and Treatment: Rehabilitation  SUBJECTIVE:   SUBJECTIVE STATEMENT: Pt reports playing recorders 2x/week 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  Pt reports pain in both thumbs [facility]) more in thumb on R and wrist on L particularly when playing recorder.  Pt reports wearing thumb splints occasionally, particularly when in the kitchen picking up items.   Pt accompanied by: self  PERTINENT HISTORY: She states she continues to have pain and discomfort in her bilateral hands. She has been taking recorder lessons which is causing discomfort in her bilateral CMC joints. She has stiffness in her hands. She has been using a [employer] which helps to some extent. 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 She has difficulty sleeping on the left side.  PRECAUTIONS: None  RED FLAGS: None   WEIGHT BEARING RESTRICTIONS: No  PAIN:  Are you having pain? Yes: NPRS scale: 2/10 Pain location: foot Pain description: aching Aggravating factors: getting up in the morning or middle of the night Relieving factors: rest  FALLS: Has patient fallen in last 6 months? No  LIVING ENVIRONMENT: Lives with: lives with their spouse Lives in: House/apartment Stairs: Yes: Internal: full flight of steps, but also has an elevator; and External: 4 steps; bilateral but cannot reach both Has following equipment at home: Single point cane, Grab bars, and elevated commode seats  PLOF: Independent and Independent with basic ADLs  PATIENT GOALS:  to keep moving  NEXT MD VISIT: PRN  OBJECTIVE:  Note: Objective measures were completed at Evaluation unless otherwise noted.  HAND DOMINANCE: Right  ADLs: Grooming: occasional pain/difficulty when clipping finger nails - does have ergonomic nail clippers UB Dressing: occasional difficulty with button, particularly at the neck  FUNCTIONAL OUTCOME MEASURES: Quick Dash: 11.36%   UPPER EXTREMITY ROM:   WFL bilaterally  COORDINATION: 9 Hole Peg test: Right: 26.94 sec; Left: 26.78 sec  COGNITION: Overall cognitive status: Within  functional limits for tasks assessed    TODAY'S TREATMENT:                                                                               DATE:  11/22/22 Reviewed fit of B thumb splints, modifying positioning of strapping on B thumbs.  OT educated on tightness and to ensure ability to fit a finger between fabric and skin to ensure not too tight. Educated on modifications to hand placement when completing routine functional tasks, to decrease extreme thumb extension and abduction.  Provided with handout.   PATIENT EDUCATION: Education details: Educated on role and purpose of OT as well as potential interventions and goals for therapy based on initial evaluation findings. Person educated: Patient Education method: Explanation and Handouts Education comprehension: verbalized understanding and needs further education  HOME EXERCISE PROGRAM: TBD  GOALS: Goals reviewed with patient? Yes  SHORT TERM GOALS: Target date: 12/22/22  Pt will verbalize understanding of task modifications, positioning, and/or potential AE needs to increase ease, safety, and independence w/ ADLs and leisure pursuits. Baseline: Goal status: INITIAL   LONG TERM GOALS: Target date: 01/17/23  Pt will be independent in AROM HEP for decreased pain and improved functional use of BUE. Baseline:  Goal status: INITIAL  2.  Pt will report ability to engage in work related/leisure tasks (playing musical instruments) without increasing pain >2/10 on pain scale. Baseline:  Goal status: INITIAL  3.  Pt will improve functional ability by decreased impairment per Quick DASH assessment from 11% to 6% or better, for better quality of life.  Baseline: 11.36% Goal status: INITIAL  4.  Pt will be independent in wear and care of thumb orthosis (may fabricate custom splint PRN) Baseline: has pre-fab splints with reports of decreased pain Goal status: INITIAL   ASSESSMENT:  CLINICAL IMPRESSION: Patient is a 82 y.o. female  who was seen today for occupational therapy evaluation for pain and discomfort in her bilateral hands. Pt has been taking recorder lessons which is causing discomfort in her bilateral CMC joints. Pt presents to evaluation with decreased pain compared to MD appt when referral placed.  Pt reports wearing pre-fab splints with improvements in pain.  Pt will benefit from skilled occupational therapy services to address ROM, pain management, safety awareness, introduction of compensatory strategies/AE prn, possible additional splinting, and implementation of an HEP to improve participation and safety during ADLs and IADLs.    PERFORMANCE DEFICITS: in functional skills including ADLs, IADLs, dexterity, pain, body mechanics, decreased knowledge of precautions, decreased knowledge of use of DME, and UE functional use and psychosocial skills including environmental adaptation and routines and behaviors.   IMPAIRMENTS: are limiting patient from ADLs, work, and leisure.  COMORBIDITIES: may have co-morbidities  that affects occupational performance. Patient will benefit from skilled OT to address above impairments and improve overall function.  MODIFICATION OR ASSISTANCE TO COMPLETE EVALUATION: No modification of tasks or assist necessary to complete an evaluation.  OT OCCUPATIONAL PROFILE AND HISTORY: Detailed assessment: Review of records and additional review of physical, cognitive, psychosocial history related to current functional performance.  CLINICAL DECISION MAKING: LOW - limited treatment options, no task modification necessary  REHAB POTENTIAL: Good  EVALUATION COMPLEXITY: Low      PLAN:  OT FREQUENCY: 1x/week  OT DURATION: 8 weeks (pt requesting every other week)  PLANNED INTERVENTIONS: 40981 OT Re-evaluation, 97535 self care/ADL training, 19147 therapeutic exercise, 97530 therapeutic activity, 97140 manual therapy, 97035 ultrasound, 97018 paraffin, 82956 fluidotherapy, 97010 moist heat,  97010 cryotherapy, 97760 Orthotics management and training, 21308 Splinting (initial encounter), [id number] Subsequent splinting/medication, energy conservation, coping strategies training, patient/family education, and DME and/or AE instructions  RECOMMENDED OTHER SERVICES: NA  CONSULTED AND AGREED WITH PLAN OF CARE: Patient  PLAN FOR NEXT SESSION: initiate AROM HEP, fabricate short opponens splint PRN   Tahj Lindseth, OTR/L 11/22/2022, 4:40 PM  [facility] at [facility] [address] [place], [place] [address] Phone # [phone] Fax # [phone]

## 2022-12-06 ENCOUNTER — Ambulatory Visit: Payer: Medicare Other | Admitting: Occupational Therapy

## 2022-12-19 ENCOUNTER — Ambulatory Visit: Payer: Medicare Other | Attending: Internal Medicine | Admitting: Occupational Therapy

## 2022-12-19 DIAGNOSIS — M25542 Pain in joints of left hand: Secondary | ICD-10-CM | POA: Diagnosis present

## 2022-12-19 DIAGNOSIS — M25541 Pain in joints of right hand: Secondary | ICD-10-CM | POA: Insufficient documentation

## 2022-12-19 NOTE — Therapy (Signed)
OUTPATIENT OCCUPATIONAL THERAPY ORTHO Treatment Note   Patient Name: Brittney Stewart MRN: [id number] DOB:30-Jul-1940, 82 y.o., female Today's Date: 12/19/2022  PCP: Harvest Forest, MD REFERRING PROVIDER: Pollyann Savoy, MD  END OF SESSION:  OT End of Session - 12/19/22 1130     Visit Number 2    Number of Visits 5    Date for OT Re-Evaluation 01/17/23    Authorization Type Medicare A&B, BSCS Supplemental    OT Start Time 1104    OT Stop Time 1132    OT Time Calculation (min) 28 min    Behavior During Therapy WFL for tasks assessed/performed              Past Medical History:  Diagnosis Date   Allergic rhinitis, cause unspecified    Benign heart murmur    GERD (gastroesophageal reflux disease)    Hypertension    Inflamed sebaceous cyst    Insomnia, unspecified    Macular degeneration, wet (HCC)    Left eye   Urine incontinence    Vitamin D deficiency    Past Surgical History:  Procedure Laterality Date   APPENDECTOMY     BACK SURGERY     CERVICAL SPINE SURGERY     COLONOSCOPY  10/11   dexa scan  02/2010   DIAGNOSTIC MAMMOGRAM  04/2010   PELVIC FRACTURE SURGERY  1968   SHOULDER SURGERY     complete right, partial left   TUMOR REMOVAL     Patient Active Problem List   Diagnosis Date Noted   Primary osteoarthritis of both hands 09/28/2021   DDD (degenerative disc disease), cervical 09/21/2017   DDD (degenerative disc disease), lumbar 09/21/2017   Inflamed sebaceous cyst 12/04/2011   Hypertension 12/04/2011   GERD (gastroesophageal reflux disease) 12/04/2011   Allergic rhinitis 12/04/2011   Hyperlipidemia 12/04/2011    ONSET DATE: referral date 10/25/22  REFERRING DIAG: M19.041,M19.042 (ICD-10-CM) - Primary osteoarthritis of both hands  THERAPY DIAG:  Pain in joint of left hand  Pain in joint of right hand  Rationale for Evaluation and Treatment: Rehabilitation  SUBJECTIVE:   SUBJECTIVE STATEMENT: Pt reports playing the piano a lot, and  is not having any pain.  Pt reports "I got through it" when playing the recorder.  Noticing some pain after a performance but "I've been able to get through it".   Pt accompanied by: self  PERTINENT HISTORY: She states she continues to have pain and discomfort in her bilateral hands. She has been taking recorder lessons which is causing discomfort in her bilateral CMC joints. She has stiffness in her hands. She has been using a [employer] which helps to some extent. 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 She has difficulty sleeping on the left side.  PRECAUTIONS: None  RED FLAGS: None   WEIGHT BEARING RESTRICTIONS: No  PAIN:  Are you having pain? No  FALLS: Has patient fallen in last 6 months? No  LIVING ENVIRONMENT: Lives with: lives with their spouse Lives in: House/apartment Stairs: Yes: Internal: full flight of steps, but also has an elevator; and External: 4 steps; bilateral but cannot reach both Has following equipment at home: Single point cane, Grab bars, and elevated commode seats  PLOF: Independent and Independent with basic ADLs  PATIENT GOALS: to keep moving  NEXT MD VISIT: PRN  OBJECTIVE:  Note: Objective measures were completed at Evaluation unless otherwise noted.  HAND DOMINANCE: Right  ADLs: Grooming: occasional pain/difficulty when  clipping finger nails - does have ergonomic nail clippers UB Dressing: occasional difficulty with button, particularly at the neck  FUNCTIONAL OUTCOME MEASURES: Quick Dash: 11.36%   UPPER EXTREMITY ROM:   WFL bilaterally  COORDINATION: 9 Hole Peg test: Right: 26.94 sec; Left: 26.78 sec  COGNITION: Overall cognitive status: Within functional limits for tasks assessed    TODAY'S TREATMENT:                                                                               DATE:  12/19/22 Reviewed joint protection recommendations and provided with handout from [place] [facility]  Protocol in regards to modified hand placement as needed with repetitive tasks and opening items.   Thumb exercises: completed x5 each bilaterally with passive followed by active thumb flexion and extension with forearm resting on table top, thumb opposition with cues to maintain "O" shape with opposition, blocking at [facility] and then MCP joints while completing flexion and extension, and thumb abduction/adduction.   Wrist exercises: engaged in wrist flexion/extension AROM, followed by PROM with sustained hold for 5-10 seconds x5 in each direction bilaterally.   11/22/22 Reviewed fit of B thumb splints, modifying positioning of strapping on B thumbs.  OT educated on tightness and to ensure ability to fit a finger between fabric and skin to ensure not too tight. Educated on modifications to hand placement when completing routine functional tasks, to decrease extreme thumb extension and abduction.  Provided with handout.   PATIENT EDUCATION: Education details: Educated on role and purpose of OT as well as potential interventions and goals for therapy based on initial evaluation findings. Person educated: Patient Education method: Explanation and Handouts Education comprehension: verbalized understanding and needs further education  HOME EXERCISE PROGRAM: See pt instructions  GOALS: Goals reviewed with patient? Yes  SHORT TERM GOALS: Target date: 12/22/22  Pt will verbalize understanding of task modifications, positioning, and/or potential AE needs to increase ease, safety, and independence w/ ADLs and leisure pursuits. Baseline: Goal status: MET - 12/19/22   LONG TERM GOALS: Target date: 01/17/23  Pt will be independent in AROM HEP for decreased pain and improved functional use of BUE. Baseline:  Goal status: IN PROGRESS  2.  Pt will report ability to engage in work related/leisure tasks (playing musical instruments) without increasing pain >2/10 on pain scale. Baseline:  Goal status:  IN PROGRESS  3.  Pt will improve functional ability by decreased impairment per Quick DASH assessment from 11% to 6% or better, for better quality of life.  Baseline: 11.36% Goal status: IN PROGRESS  4.  Pt will be independent in wear and care of thumb orthosis (may fabricate custom splint PRN) Baseline: has pre-fab splints with reports of decreased pain Goal status: IN PROGRESS   ASSESSMENT:  CLINICAL IMPRESSION: Pt verbalizing and demonstrating improvements in activity tolerance and decrease in pain, even with increase in playing musical instruments over the last month.  Pt reports still having stiffness in B wrists and hands when waking up, OT reiterating wear of orthoses at night time and introduced AROM and PROM to complete daily.  Pt demonstrating good understanding of each exercise and reporting good tolerance to each.    PERFORMANCE DEFICITS:  in functional skills including ADLs, IADLs, dexterity, pain, body mechanics, decreased knowledge of precautions, decreased knowledge of use of DME, and UE functional use and psychosocial skills including environmental adaptation and routines and behaviors.      PLAN:  OT FREQUENCY: 1x/week  OT DURATION: 8 weeks (pt requesting every other week)  PLANNED INTERVENTIONS: 97168 OT Re-evaluation, 97535 self care/ADL training, 62952 therapeutic exercise, 97530 therapeutic activity, 97140 manual therapy, 97035 ultrasound, 97018 paraffin, 84132 fluidotherapy, 97010 moist heat, 97010 cryotherapy, 97760 Orthotics management and training, 44010 Splinting (initial encounter), [phone] Subsequent splinting/medication, energy conservation, coping strategies training, patient/family education, and DME and/or AE instructions  RECOMMENDED OTHER SERVICES: NA  CONSULTED AND AGREED WITH PLAN OF CARE: Patient  PLAN FOR NEXT SESSION: Review HEP, assess goals and d/c vs place on hold   [place], [place], OTR/L 12/19/2022, 11:38 AM  [facility]  at [facility] [address] [place], [place] [address] Phone # [phone] Fax # [phone]

## 2023-01-02 ENCOUNTER — Ambulatory Visit: Payer: Medicare Other | Admitting: Occupational Therapy

## 2023-01-17 ENCOUNTER — Ambulatory Visit: Payer: Medicare Other | Admitting: Occupational Therapy

## 2023-01-18 ENCOUNTER — Encounter: Payer: Self-pay | Admitting: Occupational Therapy

## 2023-01-18 NOTE — Therapy (Signed)
  OUTPATIENT OCCUPATIONAL THERAPY ORTHO Discharge Note  Patient Name: Brittney Stewart MRN: [id number] DOB:03-17-1940, 83 y.o., female Today's Date: 01/18/2023  PCP: Harvest Forest, MD REFERRING PROVIDER: Pollyann Savoy, MD  OCCUPATIONAL THERAPY DISCHARGE SUMMARY  Visits from Start of Care: 2  Current functional level related to goals / functional outcomes: Unable to take final measurements as pt called to cancel all further appointments due to decreased pain overall and pleased with current status.  At previous visit, pt reporting much improved activity tolerance and decreased pain.    Remaining deficits: Unknown   Education / Equipment: Educated on joint protection strategies, thumb and wrist ROM, as well as recommendations for wear schedule with B thumb splints and positioning at nighttime for decreased pain/stiffness in the AM   Patient agrees to discharge. Patient goals were partially met. Patient is being discharged due to being pleased with the current functional level.Marland Kitchen      SHORT TERM GOALS: Target date: 12/22/22  Pt will verbalize understanding of task modifications, positioning, and/or potential AE needs to increase ease, safety, and independence w/ ADLs and leisure pursuits. Baseline: Goal status: MET - 12/19/22   LONG TERM GOALS: Target date: 01/17/23  Pt will be independent in AROM HEP for decreased pain and improved functional use of BUE. Baseline:  Goal status: IN PROGRESS  2.  Pt will report ability to engage in work related/leisure tasks (playing musical instruments) without increasing pain >2/10 on pain scale. Baseline:  Goal status: IN PROGRESS  3.  Pt will improve functional ability by decreased impairment per Quick DASH assessment from 11% to 6% or better, for better quality of life.  Baseline: 11.36% Goal status: IN PROGRESS  4.  Pt will be independent in wear and care of thumb orthosis (may fabricate custom splint PRN) Baseline: has pre-fab  splints with reports of decreased pain Goal status: IN PROGRESS     Rosalio Loud, OTR/L 01/18/2023, 8:34 AM  [facility] at [facility] [address], [address] [place], [place] [address] Phone # [phone] Fax # [phone]

## 2023-03-15 ENCOUNTER — Ambulatory Visit
Admission: RE | Admit: 2023-03-15 | Discharge: 2023-03-15 | Disposition: A | Discharge status: Home / Self Care | Source: Ambulatory Visit | Attending: Internal Medicine | Admitting: Internal Medicine

## 2023-03-15 ENCOUNTER — Other Ambulatory Visit: Payer: Self-pay | Admitting: Internal Medicine

## 2023-03-15 DIAGNOSIS — M25551 Pain in right hip: Secondary | ICD-10-CM

## 2023-05-03 ENCOUNTER — Encounter: Payer: Self-pay | Admitting: Physical Therapy

## 2023-05-03 NOTE — Therapy (Signed)
 [facility] [address] [place], [place], [address] Phone: [phone]   Fax:  [phone]  Patient Details  Name: Brittney Stewart MRN: [id number] Date of Birth: Jan 09, 1940 Referring Provider:  No ref. provider found  Encounter Date: 05/03/2023  PHYSICAL THERAPY DISCHARGE SUMMARY  Visits from Start of Care: 2  Current functional level related to goals / functional outcomes: NO further dizziness, after 2 visits.   Remaining deficits: No further dizziness   Education / Equipment: Educated in rationale for vestibular treatment   Patient agrees to discharge. Patient goals were met. Patient is being discharged due to being pleased with the current functional level.   Vaida Kerchner W., PT 05/03/2023, 12:36 PM  [facility] [address] [place], [place], [address] Phone: [phone]   Fax:  [phone]

## 2023-07-11 ENCOUNTER — Encounter: Payer: Self-pay | Admitting: Internal Medicine

## 2023-07-11 ENCOUNTER — Other Ambulatory Visit: Payer: Self-pay | Admitting: Internal Medicine

## 2023-07-11 DIAGNOSIS — K869 Disease of pancreas, unspecified: Secondary | ICD-10-CM

## 2023-08-01 ENCOUNTER — Ambulatory Visit: Admitting: Occupational Therapy

## 2023-08-01 ENCOUNTER — Ambulatory Visit: Attending: Internal Medicine | Admitting: Physical Therapy

## 2023-08-01 ENCOUNTER — Encounter: Payer: Self-pay | Admitting: Occupational Therapy

## 2023-08-01 ENCOUNTER — Encounter: Payer: Self-pay | Admitting: Physical Therapy

## 2023-08-01 ENCOUNTER — Other Ambulatory Visit: Payer: Self-pay

## 2023-08-01 DIAGNOSIS — Z9181 History of falling: Secondary | ICD-10-CM | POA: Insufficient documentation

## 2023-08-01 DIAGNOSIS — R2689 Other abnormalities of gait and mobility: Secondary | ICD-10-CM | POA: Diagnosis present

## 2023-08-01 DIAGNOSIS — R2681 Unsteadiness on feet: Secondary | ICD-10-CM | POA: Diagnosis present

## 2023-08-01 DIAGNOSIS — R29898 Other symptoms and signs involving the musculoskeletal system: Secondary | ICD-10-CM | POA: Insufficient documentation

## 2023-08-01 DIAGNOSIS — R208 Other disturbances of skin sensation: Secondary | ICD-10-CM | POA: Insufficient documentation

## 2023-08-01 DIAGNOSIS — R278 Other lack of coordination: Secondary | ICD-10-CM

## 2023-08-01 DIAGNOSIS — R41842 Visuospatial deficit: Secondary | ICD-10-CM

## 2023-08-01 DIAGNOSIS — H53412 Scotoma involving central area, left eye: Secondary | ICD-10-CM

## 2023-08-01 DIAGNOSIS — M6281 Muscle weakness (generalized): Secondary | ICD-10-CM | POA: Diagnosis present

## 2023-08-01 NOTE — Therapy (Signed)
 OUTPATIENT PHYSICAL THERAPY NEURO EVALUATION   Patient Name: Romanita Fager MRN: [id number] DOB:1940-02-26, 83 y.o., female Today's Date: 08/01/2023   PCP: Roanna Ezekiel NOVAK, MD REFERRING PROVIDER: Roanna Ezekiel NOVAK, MD  END OF SESSION:  PT End of Session - 08/01/23 9057     Visit Number 1    Number of Visits 9   8 + eval   Date for PT Re-Evaluation 10/12/23   Pushed out due to potential multi-disciplinary scheduling delay   Authorization Type Medicare A&B, Secondary [employer]    Progress Note Due on Visit 10    PT Start Time 0933    PT Stop Time 1020    PT Time Calculation (min) 47 min    Equipment Utilized During Treatment Gait belt    Activity Tolerance Patient tolerated treatment well    Behavior During Therapy WFL for tasks assessed/performed          Past Medical History:  Diagnosis Date   Allergic rhinitis, cause unspecified    Benign heart murmur    GERD (gastroesophageal reflux disease)    Hypertension    Inflamed sebaceous cyst    Insomnia, unspecified    Macular degeneration, wet (HCC)    Left eye   Urine incontinence    Vitamin D deficiency    Past Surgical History:  Procedure Laterality Date   APPENDECTOMY     BACK SURGERY     CERVICAL SPINE SURGERY     COLONOSCOPY  10/11   dexa scan  02/2010   DIAGNOSTIC MAMMOGRAM  04/2010   PELVIC FRACTURE SURGERY  1968   SHOULDER SURGERY     complete right, partial left   TUMOR REMOVAL     Patient Active Problem List   Diagnosis Date Noted   Primary osteoarthritis of both hands 09/28/2021   DDD (degenerative disc disease), cervical 09/21/2017   DDD (degenerative disc disease), lumbar 09/21/2017   Inflamed sebaceous cyst 12/04/2011   Hypertension 12/04/2011   GERD (gastroesophageal reflux disease) 12/04/2011   Allergic rhinitis 12/04/2011   Hyperlipidemia 12/04/2011    ONSET DATE: 2 weeks ago  REFERRING DIAG: R53.81 (ICD-10-CM) - Other malaise  THERAPY DIAG:  History of falling  Muscle weakness  (generalized)  Other abnormalities of gait and mobility  Unsteadiness on feet  Rationale for Evaluation and Treatment: Rehabilitation  SUBJECTIVE:                                                                                                                                                                                             SUBJECTIVE STATEMENT: Pt fell onto hands 2 weeks ago tripping over her dog.  She reports being cautious w/ steps, curbs, and inclines due to her low vision.  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 Pt accompanied by: self (drives herself)  PERTINENT HISTORY: HTN, GERD, low vision, HLD, cervical and lumbar DDD, OA of both hands, bilateral hearing aides  PAIN:  Are you having pain? No  PRECAUTIONS: Fall  RED FLAGS: None   WEIGHT BEARING RESTRICTIONS: No  FALLS: Has patient fallen in last 6 months? Yes. Number of falls 1 - tripped over german shepherd  LIVING ENVIRONMENT: Lives with: lives with their spouse Lives in: House/apartment Stairs: Yes: Internal: 3 flights steps; bilateral railing; she uses elevator in home to get from basement to 1st/2nd floor and External: 4 steps; bilateral but cannot reach both Has following equipment at home: Single point cane, Wheelchair (manual), Shower bench, Grab bars, and walking stick  PLOF: Independent  PATIENT GOALS: to not be afraid of falling  OBJECTIVE:  Note: Objective measures were completed at Evaluation unless otherwise noted.  DIAGNOSTIC FINDINGS: No recent or relevant imaging.  COGNITION: Overall cognitive status: Within functional limits for tasks assessed   SENSATION: Light touch: WFL  COORDINATION: LE RAMS:  WNL Bilateral Heel-to-Shin:  WNL  EDEMA:  None significant in BLE  MUSCLE TONE: None noted in BLE  POSTURE: forward head (mild)  LOWER EXTREMITY ROM:     Active  Right Eval Left Eval  Hip flexion Grossly WNL  Hip extension   Hip  abduction   Hip adduction   Hip internal rotation   Hip external rotation   Knee flexion   Knee extension   Ankle dorsiflexion   Ankle plantarflexion   Ankle inversion    Ankle eversion     (Blank rows = not tested)  LOWER EXTREMITY MMT:    MMT Right Eval Left Eval  Hip flexion 4 3+  Hip extension    Hip abduction    Hip adduction    Hip internal rotation    Hip external rotation    Knee flexion    Knee extension 4+; crepitus 5  Ankle dorsiflexion 5 5  Ankle plantarflexion    Ankle inversion    Ankle eversion    (Blank rows = not tested)  BED MOBILITY:  Findings: Sit to supine Complete Independence Supine to sit Complete Independence Rolling to Right Complete Independence Rolling to Left Complete Independence  TRANSFERS: Sit to stand: Complete Independence  Assistive device utilized: None     Stand to sit: Complete Independence  Assistive device utilized: None     Chair to chair: Complete Independence  Assistive device utilized: None       RAMP:  Not tested  CURB:  Not tested  STAIRS: Not tested GAIT: Findings: Gait Characteristics: step through pattern, decreased hip/knee flexion- Right, decreased hip/knee flexion- Left, and narrow BOS, Distance walked: various clinic distances, Assistive device utilized:None, Level of assistance: Complete Independence, and Comments: Mild right drift at very end of pathway.  FUNCTIONAL TESTS:  5 times sit to stand: 15.04 sec w/ BUE support 10 meter walk test: 7.50 sec SBA no AD = 1.33 m/sec OR 4.4 ft/sec Functional gait assessment:  TBA  Interpretation of scores: Non-Specific Older Adults Cutoff Score: <=22/30 = risk of falls Parkinson's Disease Cutoff score <15/30= fall risk (Hoehn & Yahr 1-4)  Minimally Clinically Important Difference (MCID)  Stroke (acute, subacute, and chronic) = MDC: 4.2 points Vestibular (acute) = MDC: 6 points Community Dwelling Older Adults =  MCID: 4 points Parkinson's Disease  =  MDC: 4.3  points  (Academy of Neu[employer]ic Physical Therapy (nd). Functional Gait Assessment. Retrieved from https://www.neuropt.org/docs/default-source/cpgs/core-outcome-measures/function-gait-assessment-pocket-guide-proof9-(2).pdf?sfvrsn=b7f35043_0.)  MCTSIB: TBA  PATIENT SURVEYS:  ABC scale: The Activities-Specific Balance Confidence (ABC) Scale 0% 10 20 30  40 50 60 70 80 90 100% No confidence<->completely confident  "How confident are you that you will not lose your balance or become unsteady when you . . .   Date tested 08/01/2023  Walk around the house 100%  2. Walk up or down stairs 50%  3. Bend over and pick up a slipper from in front of a closet floor 100%  4. Reach for a small can off a shelf at eye level 100%  5. Stand on tip toes and reach for something above your head 80%  6. Stand on a chair and reach for something 100%  7. Sweep the floor 100%  8. Walk outside the house to a car parked in the driveway 90%  9. Get into or out of a car 100%  10. Walk across a parking lot to the mall 100%  11. Walk up or down a ramp 50%  12. Walk in a crowded mall where people rapidly walk past you 100%  13. Are bumped into by people as you walk through the mall 90%  14. Step onto or off of an escalator while you are holding onto the railing 100%  15. Step onto or off an escalator while holding onto parcels such that you cannot hold onto the railing 100%  16. Walk outside on icy sidewalks 0%  Total: #/16 85%                                                                                                                                 TREATMENT DATE: 08/01/2023    PATIENT EDUCATION: Education details: PT POC, assessments used and to be used, and goals to be set.  Contrast and night lights in home.  Moving throw rugs (she has none) and furniture blocking common pathways in home.  Discussed vision and hearing playing a role in balance.  Fear of falling as a risk factor for falls.  Using cane for  stairs when having no railing due to vision - she had a recent encounter of this, open to option of folding SPC that could be concealed and then using for community obstacles lacking support or contrast.  Discussed rollator option for yard as she is pushing wheelchair to keep dog from knocking her over.  Discussed gait speed - recommended slowing down in poorly lit environments/nighttime, new and outdoor environments to scan for unexpected obstacles. Person educated: Patient Education method: Medical illustrator Education comprehension: verbalized understanding  HOME EXERCISE PROGRAM: Formal HEP to be established - STS w/ BUE support to begin working on BLE strength.  GOALS: Goals reviewed with patient? Yes  SHORT TERM GOALS: Target date: 08/31/2023  Pt will be independent  and compliant with introductory strength and balance HEP in order to maintain functional progress and improve mobility. Baseline:  Informally established on eval. Goal status: INITIAL  2.  FGA to be assessed w/ STG set as appropriate. Baseline: To be assessed. Goal status: INITIAL  3.  mCTSIB to be assessed w/ STG set as appropriate. Baseline: To be assessed. Goal status: INITIAL  LONG TERM GOALS: Target date: 09/28/2023  Pt will be independent and compliant with advanced and finalized strength and balance HEP in order to maintain functional progress and improve mobility. Baseline: Informally established on eval. Goal status: INITIAL  2.  Pt will improve ABC Scale score to >/=95% in order to decrease fall risk by demonstrating decreased fear of falling. Baseline: 85% Goal status: INITIAL  3.  FGA to be assessed w/ LTG set as appropriate. Baseline: To be assessed. Goal status: INITIAL  4.  mCTSIB to be assessed w/ LTG set as appropriate. Baseline: To be assessed. Goal status: INITIAL  5.  Pt will decrease 5xSTS to </=12 seconds w/o UE support in order to demonstrate decreased risk for falls and  improved functional bilateral LE strength and power. Baseline: 15.04 sec w/ BUE support Goal status: INITIAL  ASSESSMENT:  CLINICAL IMPRESSION: Patient is a 83 y.o. female who was seen today for physical therapy evaluation and treatment for imbalance and weakness.  Pt has a significant PMH of HTN, GERD, low vision, HLD, cervical and lumbar DDD, OA of both hands, and bilateral hearing aides.  Identified impairments include mild forward head posture, mild BLE weakness mainly proximally, narrowed ambulatory BOS and diminished hip and knee flexion, fear of falling, and low vision impacting gait and balance.  Evaluation via the following assessment tools: 5xSTS and ABC scale indicate elevated fall risk.  Her gait speed was WNL and PT actually encouraged slowing down in new environments to allow increased time for scanning for obstacles due to pathway drift noted today.  She would benefit from skilled PT to address impairments as noted and progress towards long term goals.  OBJECTIVE IMPAIRMENTS: Abnormal gait, decreased balance, decreased knowledge of use of DME, decreased strength, impaired vision/preception, improper body mechanics, and postural dysfunction.   ACTIVITY LIMITATIONS: carrying, lifting, standing, squatting, stairs, reach over head, and locomotion level  PARTICIPATION LIMITATIONS: meal prep, cleaning, community activity, and yard work  PERSONAL FACTORS: Age, Fitness, Past/current experiences, and 1-2 comorbidities: low vision, impaired hearing are also affecting patient's functional outcome.   REHAB POTENTIAL: Excellent  CLINICAL DECISION MAKING: Evolving/moderate complexity  EVALUATION COMPLEXITY: Moderate  PLAN:  PT FREQUENCY: 1x/week  PT DURATION: 8 weeks  PLANNED INTERVENTIONS: 97164- PT Re-evaluation, 97750- Physical Performance Testing, 97110-Therapeutic exercises, 97530- Therapeutic activity, [id number]- Neuromuscular re-education, 97535- Self Care, [id number]- Manual therapy,  [phone]- Gait training, Patient/Family education, Balance training, Stair training, Vestibular training, and DME instructions  PLAN FOR NEXT SESSION: SPC - folding options for stairs vs rollator outdoors.  Core engagement per pt request.  STS no UE support.  ASSESS FGA and mCTSIB - set goals as appropriate.  Initiate HEP for high level balance and BLE strength.   Daved KATHEE Bull, PT, DPT 08/01/2023, 10:22 AM

## 2023-08-01 NOTE — Therapy (Signed)
 OUTPATIENT OCCUPATIONAL THERAPY NEURO EVALUATION  Patient Name: Brittney Stewart MRN: [id number] DOB:1940-11-28, 83 y.o., female Today's Date: 08/01/2023  PCP: Roanna Ezekiel NOVAK, MD REFERRING PROVIDER: Roanna Ezekiel NOVAK, MD  END OF SESSION:  OT End of Session - 08/01/23 0910     Visit Number 1    Number of Visits 10    Date for OT Re-Evaluation 10/05/23    Authorization Type Medicare A&B/BCBS Supp 2025 Covered 100% Follow Medicare Guidelines    Progress Note Due on Visit 10    OT Start Time 1020   OT eval initiated prior to PT eval and resumed at this time.   OT Stop Time 1100    OT Time Calculation (min) 40 min    Equipment Utilized During Treatment Testing material    Activity Tolerance Patient tolerated treatment well    Behavior During Therapy WFL for tasks assessed/performed          Past Medical History:  Diagnosis Date   Allergic rhinitis, cause unspecified    Benign heart murmur    GERD (gastroesophageal reflux disease)    Hypertension    Inflamed sebaceous cyst    Insomnia, unspecified    Macular degeneration, wet (HCC)    Left eye   Urine incontinence    Vitamin D deficiency    Past Surgical History:  Procedure Laterality Date   APPENDECTOMY     BACK SURGERY     CERVICAL SPINE SURGERY     COLONOSCOPY  10/11   dexa scan  02/2010   DIAGNOSTIC MAMMOGRAM  04/2010   PELVIC FRACTURE SURGERY  1968   SHOULDER SURGERY     complete right, partial left   TUMOR REMOVAL     Patient Active Problem List   Diagnosis Date Noted   Primary osteoarthritis of both hands 09/28/2021   DDD (degenerative disc disease), cervical 09/21/2017   DDD (degenerative disc disease), lumbar 09/21/2017   Inflamed sebaceous cyst 12/04/2011   Hypertension 12/04/2011   GERD (gastroesophageal reflux disease) 12/04/2011   Allergic rhinitis 12/04/2011   Hyperlipidemia 12/04/2011    ONSET DATE: 07/11/2023  REFERRING DIAG: R53.81 (ICD-10-CM) - Other malaise  THERAPY DIAG:   Visuospatial deficit  Scotoma involving central area, left eye  Other symptoms and signs involving the musculoskeletal system  Other disturbances of skin sensation  Other lack of coordination  Rationale for Evaluation and Treatment: Rehabilitation  SUBJECTIVE:   SUBJECTIVE STATEMENT:  Pt arrived late for her appt due to some mechanical issues with her car this morning and getting lost when trying ot locate this office. OTR was able to see pt for 20 minutes prior to transition to PT evaluation and then pt returned to OT to complete eval/tx.  When asked why she was here for OT, pt reported that she was here for her vision ie) pt reports having both wet and dry macular degeneration and has been seeing [facility] for shots every 4 months for each type, therefore has shots every 2 months and actually has an appt tomorrow.  Pt reports she might notice a small change prior to getting her injections but not always.  Pt does not drive at night or in rain due to visual concerns and had her license renewed about 1 year ago. PT states her vision is about the same as then.   Pt wears bifocals all the time but thinks she might need a new prescription.  Her ophthalmologist in [place] retired and she needs to get a new  physician.    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  Pt reports she got new hearing aides recently as well.  Pt accompanied by: self  PERTINENT HISTORY:   Per MD report 2024: Stable age related macular degeneration. Need to use vitamins and monitor with Amsler grid. Follow with Dr Renell. Have not seen most recent notes but pt reports injections now OU. Sounds like antiVEGF OD and newer drug for dry OS.  Seen previously for OT in 2024 - She had been taking recorder lessons which was causing discomfort in her bilateral CMC joints. She had stiffness in her hands. She had been using a hand massager which helps to some extent.  She continued to have some discomfort in her left shoulder joint which was replaced. She had difficulty sleeping on the left side.   Dupuytren's contracture of right hand-asymptomatic. H/O total shoulder replacement, right. H/O of left shoulder hemiarthroplasty. Rheumatoid factor positive   PRECAUTIONS: Fall  WEIGHT BEARING RESTRICTIONS: No  PAIN:  Are you having pain? No  FALLS: Has patient fallen in last 6 months? Yes. Number of falls 1 tripped over dog - 110 lb [place] - 2 weeks ago and landed on tile floor and managed to get up but she twisted funny and had pain on her side but that seems to be ok - minor twinges.   LIVING ENVIRONMENT: Lives with: lives with their spouse Lives in: House Stairs: Yes: External: 4 steps; bilateral but cannot reach both - Indoors equipped with elevator Has following equipment at home: Wheelchair (manual) and perhaps walker that belonged to her mother  PLOF: Independent - Pt reports 3000-4000 steps/day, active in music community, including several recorder ensembles.  Pt was headed to a [employer] after OT/PT evaluations.  PATIENT GOALS: Not sure - but knows she could use some recommendations for her vision ie) needs brighter light to read, being able to make fonts bigger etc.  Trouble reading directions esp red and in the dark, buttons on stoves are hard to read.  OBJECTIVE:  Note: Objective measures were completed at Evaluation unless otherwise noted.  GLASSES: bifocals  CURRENT MODIFICATIONS/ADAPTIONS: backlit Ipad for reading music  DEVICES: Ipad, light with magnifier, flashlight hanging on keychain  PHONE: Iphone - reported awareness of magnifier  HAND DOMINANCE: Right  ADLs: Overall ADLs: Pt rerports she is independent  Transfers/ambulation related to ADLs: Ind but will push things around for support Eating: Ind Grooming: Ind UB Dressing: Ind LB Dressing: Ind Toileting: Ind Bathing: Ind Tub Shower transfers: Walk in  shower, has seat built in and grab bars - vertical and horizontal. Equipment: Shower seat with back, Grab bars, and Walk in shower  IADLs: Pt is still driving in the daytime - does not drie at night or in the rain but notes she can get assistance of a driver as needed. 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  MOBILITY STATUS: Independent and Hx of falls - no AE at this time  POSTURE COMMENTS:  rounded shoulders and forward head Sitting balance: WFL  ACTIVITY TOLERANCE: Activity tolerance: Good  FUNCTIONAL OUTCOME MEASURES: Revised Self-Reported Assessment of Functional Visual Performance - 87% (13% impairment)  UPPER EXTREMITY ROM:   End range shoulder ROM due to history of shoulder surgeries  UPPER EXTREMITY MMT:     UE strength - WFL  HAND FUNCTION: Grip strength: Right: 47.8, 55.1,  lbs; Left: 47.1, 50.2 lbs Average: Right: 51.5 lbs; Left: 48.7 lbs  COORDINATION: 9 Hole Peg test: Right: 26.15 sec; Left: 25.29 sec  SENSATION: Aware of perhaps some numbness more in the morning  EDEMA: NA  MUSCLE TONE: WFL  COGNITION: Overall cognitive status: Within functional limits for tasks assessed  VISION: Subjective report: Pt reports injections every 2 months alternating between tx for wet and dry macular degeneration Baseline vision: Bifocals and Wears glasses all the time Visual history: macular degeneration  VISION ASSESSMENT: To be further assessed in functional context Tracking appeared [facility] with slight variations  Left eye - has some trouble with seeing lower half of person's face  Pt admits she would give up driving if she thought she was unsafe as she reports being able to  have a driver, which she does use if she is going to [facility] for appts.  Patient has difficulty with following activities due to following visual impairments:  -really relies on light to help with vision -finding dropped medication (depending on background) -pouring coffee into cups and knowing when it's full -pt made herself a bigger chart for financial record keeping -not reading as much as before due to difficulties -sometimes managing curbs -reading street signs on occasion due to lighting -more difficulty in grocery store seeing smaller items that are a distance away from her  OBSERVATIONS: Pt ambulates with no AE and no frank loss of balance but some hand support sought on occasion. The pt is well kept and has glasses donned.                                                                                                                            TODAY'S TREATMENT:    - Self care education and pt training initiated for duration as noted below including:  OT educated pt on rehabilitation process and results of objective measures in relation to pt specific goals.   OT educated pt on general vision strategies as noted in pt instructions to improve independence and safety with ADL and IADL completion secondary to visual impairment.  Initiated joint protection education and sleep positioning recommendations to minimize numbness and discomfort.  PATIENT EDUCATION: Education details: OT role and POC considerations as well as info noted above. Person educated: Patient Education method: Explanation, Verbal cues, and Visual images also shown Education comprehension: verbalized understanding  HOME EXERCISE PROGRAM: 08/01/23: initiated education re: vision strategies and joint protection.  GOALS: Goals reviewed with patient? Yes  SHORT TERM GOALS: Target date: 09/07/23  Patient will independently recall at least 2 compensatory strategies for visual impairment without  cueing. Baseline: New to outpt OT  Goal status: INITIAL  2.  Patient will return demonstration of visual adaptation use and verbalize understanding of  how to obtain item(s) if desired.  Baseline: New to outpt OT  Goal status: INITIAL  3.  Patient will utilize adaptive visual strategies and/or devices to independently and safely complete daily household tasks (e.g., medication management, pouring liquids). Baseline: New to outpt OT  Goal status: INITIAL  4.  Pt will verbalize understanding of adapted strategies and/or equipment PRN to increase safety and independence with ADLs and IADLs for max joint protection. Baseline: history of B hand arthritis Goal status: INITIAL   LONG TERM GOALS: Target date: 10/05/23  Patient will demonstrate improved functional mobility and safety with environmental navigation (e.g., curbs, store aisles) using visual compensatory strategies in 90% of observed tasks. Baseline: New to outpt OT  Goal status: INITIAL  2.  Patient will use visual scanning, magnification, or app-based assistance to identify items on grocery shelves at mid- to far-distance in 3/5 observed tasks. Baseline: New to outpt OT  Goal status: INITIAL  3.  OT to assess and set appropriate PSFS goal.  Baseline: Pt identified areas such as buttons on appliances and need for new lamp as areas to address but did not rate impairments in these areas yet Goal status: INITIAL   ASSESSMENT:  CLINICAL IMPRESSION: Patient is a 83 y.o. female who was seen today for occupational therapy evaluation for assessment of vision related impairments to ADLs and IADLs. Hx includes both wet and dry macular degeneration, arthritis, B shoulder sx/replacements. Patient currently presents with some increased awareness of visual changes and would benefit from skilled OT services in the outpatient setting for education on visual impairment(s), compensatory and safety strategies, and use of AD as needed for more  independent and safe completion of daily occupations; as well as to address and deficits r/t arthritis etc.  PERFORMANCE DEFICITS: in functional skills including ADLs, IADLs, coordination, sensation, ROM, strength, flexibility, Fine motor control, Gross motor control, hearing, mobility, balance, decreased knowledge of precautions, decreased knowledge of use of DME, vision, and UE functional use, cognitive skills including safety awareness, and psychosocial skills including coping strategies, environmental adaptation, habits, and routines and behaviors.   IMPAIRMENTS: are limiting patient from ADLs, IADLs, rest and sleep, work, and leisure.   CO-MORBIDITIES: has co-morbidities such as arthritis and macular degeneration that affects occupational performance. Patient will benefit from skilled OT to address above impairments and improve overall function.  MODIFICATION OR ASSISTANCE TO COMPLETE EVALUATION: Min-Moderate modification of tasks or assist with assess necessary to complete an evaluation.  OT OCCUPATIONAL PROFILE AND HISTORY: Detailed assessment: Review of records and additional review of physical, cognitive, psychosocial history related to current functional performance.  CLINICAL DECISION MAKING: Moderate - several treatment options, min-mod task modification necessary  REHAB POTENTIAL: Good  EVALUATION COMPLEXITY: Moderate    PLAN:  OT FREQUENCY: 1x/week  OT DURATION: 8 weeks - POC extended due to scheduling  PLANNED INTERVENTIONS: 97168 OT Re-evaluation, 97535 self care/ADL training, 02889 therapeutic exercise, 97530 therapeutic activity, 97763 Orthotic/Prosthetic subsequent, functional mobility training, visual/perceptual remediation/compensation, coping strategies training, patient/family education, and DME and/or AE instructions  RECOMMENDED OTHER SERVICES: NA - PT evaluation occurred on the same day as OT eval  CONSULTED AND AGREED WITH PLAN OF CARE: Patient  PLAN FOR  NEXT SESSION:  Go over low vision packet Visual comp strategies/HEP AE options PSFS - buttons on stove, etc   Clarita LITTIE Pride, OT 08/01/2023, 8:39 PM

## 2023-08-15 ENCOUNTER — Ambulatory Visit: Admitting: Occupational Therapy

## 2023-08-15 ENCOUNTER — Encounter: Payer: Self-pay | Admitting: Physical Therapy

## 2023-08-15 ENCOUNTER — Ambulatory Visit: Attending: Internal Medicine | Admitting: Physical Therapy

## 2023-08-15 DIAGNOSIS — R41842 Visuospatial deficit: Secondary | ICD-10-CM

## 2023-08-15 DIAGNOSIS — M6281 Muscle weakness (generalized): Secondary | ICD-10-CM | POA: Insufficient documentation

## 2023-08-15 DIAGNOSIS — M25542 Pain in joints of left hand: Secondary | ICD-10-CM | POA: Diagnosis present

## 2023-08-15 DIAGNOSIS — R2689 Other abnormalities of gait and mobility: Secondary | ICD-10-CM | POA: Insufficient documentation

## 2023-08-15 DIAGNOSIS — R208 Other disturbances of skin sensation: Secondary | ICD-10-CM | POA: Insufficient documentation

## 2023-08-15 DIAGNOSIS — H3530 Unspecified macular degeneration: Secondary | ICD-10-CM | POA: Diagnosis present

## 2023-08-15 DIAGNOSIS — R2681 Unsteadiness on feet: Secondary | ICD-10-CM | POA: Insufficient documentation

## 2023-08-15 DIAGNOSIS — Z9181 History of falling: Secondary | ICD-10-CM | POA: Insufficient documentation

## 2023-08-15 DIAGNOSIS — R278 Other lack of coordination: Secondary | ICD-10-CM | POA: Insufficient documentation

## 2023-08-15 DIAGNOSIS — R29898 Other symptoms and signs involving the musculoskeletal system: Secondary | ICD-10-CM | POA: Insufficient documentation

## 2023-08-15 NOTE — Patient Instructions (Signed)
 Low Vision Phone Education - Instructions  1. Phone Magnifier (Magnifier App or Feature) Purpose: Use the phone's camera to magnify printed text, images, or small objects. Instructions: Open the Magnifier app (on iPhone) or enable it through Accessibility settings. Point the camera at the object/text. Use the slider or pinch-to-zoom gesture to increase magnification. Use the flashlight option for better lighting. Practice steady hand placement to reduce image shake. Save magnified images if needed for future reference.  2. Screen Zoom Purpose: Increase the size of on-screen elements to reduce eye strain. Instructions: iOS: Go to Settings > Accessibility > Zoom and toggle it on. Triple-tap with three fingers to zoom in/out. Drag with three fingers to move around the screen. Android: Go to Settings > Accessibility > Magnification. Tap the screen 3 times or use a shortcut to zoom. Use two fingers to pan and adjust zoom level. 3. Smart Invert / Dark Mode Purpose: Reduce glare and eye fatigue by inverting colors or using dark backgrounds. Instructions: iOS: Go to Settings > Accessibility > Display & Text Size > Smart Invert. Toggle Smart Invert to ON to reverse colors (excludes images/videos). Android: Enable Dark Mode under Display settings or Accessibility > Color inversion.  4. High Contrast Text Purpose: Improves readability by enhancing the distinction between text and background. Instructions: iOS: Go to Settings > Accessibility > Display & Text Size > Increase Contrast. Android: Go to Settings > Accessibility > High contrast text and enable it.   5. Turn Off Auto-Brightness Purpose: Prevents screen brightness from fluctuating, which may cause visual discomfort. Instructions: iOS: Go to Settings > Accessibility > Display & Text Size, scroll to the bottom, and toggle off Auto-Brightness. Android: Go to Settings > Display > Adaptive Brightness and turn it off. Manually adjust  brightness to a consistent, comfortable level using the brightness slider.  6. Minimize and Organize Apps on Home Screen Purpose: Simplifies navigation and improves accuracy when locating and selecting apps. Instructions: Identify the top 6-8 most frequently used apps. Move them to the first home screen or place them in the dock [employer]) or home row [employer]). Group less-used apps into folders or move them to secondary pages. Label folders clearly using short, high-contrast names. Avoid clutter: keep 1-2 home screens maximum. Practice navigating to apps using larger icons or screen zoom. Use voice commands Engineer, production) to open apps when visual navigation is difficult.

## 2023-08-15 NOTE — Therapy (Signed)
 OUTPATIENT OCCUPATIONAL THERAPY LOW VISION TREATMENT  Patient Name: Brittney Stewart MRN: [id number] DOB:04-09-1940, 83 y.o., female Today's Date: 08/15/2023  PCP: Roanna Ezekiel NOVAK, MD REFERRING PROVIDER: Roanna Ezekiel NOVAK, MD  END OF SESSION:  OT End of Session - 08/15/23 0802     Visit Number 2    Number of Visits 10    Date for OT Re-Evaluation 10/05/23    Authorization Type Medicare A&B/BCBS Supp 2025 Covered 100% Follow Medicare Guidelines    Progress Note Due on Visit 10    OT Start Time 0802    OT Stop Time 0845    OT Time Calculation (min) 43 min    Equipment Utilized During Treatment iPhone    Activity Tolerance Patient tolerated treatment well    Behavior During Therapy WFL for tasks assessed/performed          Past Medical History:  Diagnosis Date   Allergic rhinitis, cause unspecified    Benign heart murmur    GERD (gastroesophageal reflux disease)    Hypertension    Inflamed sebaceous cyst    Insomnia, unspecified    Macular degeneration, wet (HCC)    Left eye   Urine incontinence    Vitamin D deficiency    Past Surgical History:  Procedure Laterality Date   APPENDECTOMY     BACK SURGERY     CERVICAL SPINE SURGERY     COLONOSCOPY  10/11   dexa scan  02/2010   DIAGNOSTIC MAMMOGRAM  04/2010   PELVIC FRACTURE SURGERY  1968   SHOULDER SURGERY     complete right, partial left   TUMOR REMOVAL     Patient Active Problem List   Diagnosis Date Noted   Primary osteoarthritis of both hands 09/28/2021   DDD (degenerative disc disease), cervical 09/21/2017   DDD (degenerative disc disease), lumbar 09/21/2017   Inflamed sebaceous cyst 12/04/2011   Hypertension 12/04/2011   GERD (gastroesophageal reflux disease) 12/04/2011   Allergic rhinitis 12/04/2011   Hyperlipidemia 12/04/2011    ONSET DATE: 07/11/2023  REFERRING DIAG: R53.81 (ICD-10-CM) - Other malaise  THERAPY DIAG:  Visuospatial deficit  Macular degeneration of both eyes, unspecified  type  Rationale for Evaluation and Treatment: Rehabilitation  SUBJECTIVE:   SUBJECTIVE STATEMENT:  Pt reported that she has made an appointment to check and see if she needs a new prescription.   Pt accompanied by: self  PERTINENT HISTORY:   Per MD report 2024: Stable age related macular degeneration. Need to use vitamins and monitor with Amsler grid. Follow with Dr Renell. Have not seen most recent notes but pt reports injections now OU. Sounds like antiVEGF OD and newer drug for dry OS.  Seen previously for OT in 2024 - She had been taking recorder lessons which was causing discomfort in her bilateral CMC joints. She had stiffness in her hands. She had been using a hand massager which helps to some extent. She continued to have some discomfort in her left shoulder joint which was replaced. She had difficulty sleeping on the left side.   Dupuytren's contracture of right hand-asymptomatic. H/O total shoulder replacement, right. H/O of left shoulder hemiarthroplasty. Rheumatoid factor positive   PRECAUTIONS: Fall  WEIGHT BEARING RESTRICTIONS: No  PAIN:  Are you having pain? No  FALLS: Has patient fallen in last 6 months? Yes. Number of falls 1 tripped over dog - 110 lb [place] - 2 weeks ago and landed on tile floor and managed to get up but she twisted funny and had pain  on her side but that seems to be ok - minor twinges.   LIVING ENVIRONMENT: Lives with: lives with their spouse Lives in: House Stairs: Yes: External: 4 steps; bilateral but cannot reach both - Indoors equipped with elevator Has following equipment at home: Wheelchair (manual) and perhaps walker that belonged to her mother  PLOF: Independent - Pt reports 3000-4000 steps/day, active in music community, including several recorder ensembles.  Pt was headed to a [employer] after OT/PT evaluations.  PATIENT GOALS: Not sure - but knows she could use some recommendations for her vision ie) needs  brighter light to read, being able to make fonts bigger etc.  Trouble reading directions esp red and in the dark, buttons on stoves are hard to read.  OBJECTIVE:  Note: Objective measures were completed at Evaluation unless otherwise noted.  GLASSES: bifocals  CURRENT MODIFICATIONS/ADAPTIONS: backlit Ipad for reading music  DEVICES: Ipad, light with magnifier, flashlight hanging on keychain  PHONE: Iphone - reported awareness of magnifier  HAND DOMINANCE: Right  ADLs: Overall ADLs: Pt rerports she is independent  Transfers/ambulation related to ADLs: Ind but will push things around for support Eating: Ind Grooming: Ind UB Dressing: Ind LB Dressing: Ind Toileting: Ind Bathing: Ind Tub Shower transfers: Walk in shower, has seat built in and grab bars - vertical and horizontal. Equipment: Shower seat with back, Grab bars, and Walk in shower  IADLs: Pt is still driving in the daytime - does not drie at night or in the rain but notes she can get assistance of a driver as needed. 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  MOBILITY STATUS: Independent and Hx of falls - no AE at this time  POSTURE COMMENTS:  rounded shoulders and forward head Sitting balance: WFL  ACTIVITY TOLERANCE: Activity tolerance: Good  FUNCTIONAL OUTCOME MEASURES: Revised Self-Reported Assessment of Functional Visual Performance - 87% (13% impairment)  UPPER EXTREMITY ROM:   End range shoulder ROM due to history of shoulder surgeries  UPPER EXTREMITY MMT:     UE strength - WFL  HAND FUNCTION: Grip strength: Right: 47.8, 55.1,  lbs; Left: 47.1, 50.2 lbs Average: Right: 51.5 lbs; Left: 48.7  lbs  COORDINATION: 9 Hole Peg test: Right: 26.15 sec; Left: 25.29 sec  SENSATION: Aware of perhaps some numbness more in the morning  EDEMA: NA  MUSCLE TONE: WFL  COGNITION: Overall cognitive status: Within functional limits for tasks assessed  VISION: Subjective report: Pt reports injections every 2 months alternating between tx for wet and dry macular degeneration Baseline vision: Bifocals and Wears glasses all the time Visual history: macular degeneration  VISION ASSESSMENT: To be further assessed in functional context Tracking appeared [facility] with slight variations  Left eye - has some trouble with seeing lower half of person's face  Pt admits she would give up driving if she thought she was unsafe as she reports being able to have a driver, which she does use if she is going to [facility] for appts.  Patient has difficulty with following activities due to following visual impairments:  -really relies on light to help with vision -finding dropped medication (depending on background) -pouring coffee into cups and knowing when it's full -pt made herself  a bigger chart for financial record keeping -not reading as much as before due to difficulties -sometimes managing curbs -reading street signs on occasion due to lighting -more difficulty in grocery store seeing smaller items that are a distance away from her  OBSERVATIONS: Pt ambulates with no AE and no frank loss of balance but some hand support sought on occasion. The pt is well kept and has glasses donned.                                                                                                                            TODAY'S TREATMENT:    - Self care education and pt training initiated for duration as noted below including:  OT educated pt on use of phone magnifier, screen zoom, smart invert, high contrast, turned off auto brightness, and potential minimized apps on home screen to reflect most frequently used  in order to improve accurate location/selection.   - pt already familiar with phone magnifier  - screen zoom feature was not as effective as modifying text to be bolder and a slightly larger font  - pt shown how to change contrast and brightness with pt leaving features as is  - pt to potentially benefit from organizing apps but wanted to wait at this time but did note she would like to alphabetize her apps    OT educated pt on tactile markers/bump dots for the visually impaired as found at [email or web address] and as emailed to pt to improve independence and safety with ADL and IADL completion secondary to visual impairment.   PATIENT EDUCATION: Education details: Investment banker, operational Person educated: Patient Education method: Programmer, multimedia, Facilities manager, Verbal cues, and Handouts Education comprehension: verbalized understanding, returned demonstration, verbal cues required, and needs further education  HOME EXERCISE PROGRAM: 08/01/23: initiated education re: vision strategies and joint protection. 08/15/23: Iphone modification options  GOALS: Goals reviewed with patient? Yes  SHORT TERM GOALS: Target date: 09/07/23  Patient will independently recall at least 2 compensatory strategies for visual impairment without cueing. Baseline: New to outpt OT  Goal status: IN Progress  2.  Patient will return demonstration of visual adaptation use and verbalize understanding of how to obtain item(s) if desired.  Baseline: New to outpt OT  Goal status: IN Progress  3.  Patient will utilize adaptive visual strategies and/or devices to independently and safely complete daily household tasks (e.g., medication management, pouring liquids). Baseline: New to outpt OT  Goal status: IN Progress  4.  Pt will verbalize understanding of adapted strategies and/or equipment PRN to increase safety and independence with ADLs and IADLs for max joint protection. Baseline: history of B  hand arthritis Goal status: INITIAL   LONG TERM GOALS: Target date: 10/05/23  Patient will demonstrate improved functional mobility and safety with environmental navigation (e.g., curbs, store aisles) using visual compensatory strategies in 90% of observed tasks. Baseline: New to outpt OT  Goal status: IN Progress  2.  Patient will use visual scanning,  magnification, or app-based assistance to identify items on grocery shelves at mid- to far-distance in 3/5 observed tasks. Baseline: New to outpt OT  Goal status: IN Progress  3.  OT to assess and set appropriate PSFS goal.  Baseline: Pt identified areas such as buttons on appliances and need for new lamp as areas to address but did not rate impairments in these areas yet Goal status: INITIAL   ASSESSMENT:  CLINICAL IMPRESSION: Patient is a 83 y.o. female who was seen today for occupational therapy treatment for of vision related impairments due to macular degeneration.  Pt appreciative of options provided today for tactile dots for use on kitchen appliances and modifications for phone access.  Pt will benefit from further skilled OT services in the outpatient setting for education on visual impairment(s), compensatory and safety strategies, and use of AD as needed for more independent and safe completion of daily occupations; as well as to address and deficits r/t arthritis etc.  PERFORMANCE DEFICITS: in functional skills including ADLs, IADLs, coordination, sensation, ROM, strength, flexibility, Fine motor control, Gross motor control, hearing, mobility, balance, decreased knowledge of precautions, decreased knowledge of use of DME, vision, and UE functional use, cognitive skills including safety awareness, and psychosocial skills including coping strategies, environmental adaptation, habits, and routines and behaviors.   IMPAIRMENTS: are limiting patient from ADLs, IADLs, rest and sleep, work, and leisure.   CO-MORBIDITIES: has  co-morbidities such as arthritis and macular degeneration that affects occupational performance. Patient will benefit from skilled OT to address above impairments and improve overall function.  REHAB POTENTIAL: Good  PLAN:  OT FREQUENCY: 1x/week  OT DURATION: 8 weeks - POC extended due to scheduling  PLANNED INTERVENTIONS: 97168 OT Re-evaluation, 97535 self care/ADL training, 02889 therapeutic exercise, 97530 therapeutic activity, 97763 Orthotic/Prosthetic subsequent, functional mobility training, visual/perceptual remediation/compensation, coping strategies training, patient/family education, and DME and/or AE instructions  RECOMMENDED OTHER SERVICES: NA - PT evaluation occurred on the same day as OT eval  CONSULTED AND AGREED WITH PLAN OF CARE: Patient  PLAN FOR NEXT SESSION:  Provided low vision packet Visual comp strategies/HEP AE options PSFS - buttons on stove, etc  Car controls Alphabetical order to apps   Clarita LITTIE Pride, OT 08/15/2023, 4:52 PM

## 2023-08-15 NOTE — Therapy (Signed)
 OUTPATIENT PHYSICAL THERAPY NEURO TREATMENT   Patient Name: Brittney Stewart MRN: [id number] DOB:[birth year], 83 y.o., female Today's Date: 08/15/2023   PCP: Roanna Ezekiel NOVAK, MD REFERRING PROVIDER: Roanna Ezekiel NOVAK, MD  END OF SESSION:  PT End of Session - 08/15/23 9148     Visit Number 2    Number of Visits 9   8 + eval   Date for PT Re-Evaluation 10/12/23   Pushed out due to potential multi-disciplinary scheduling delay   Authorization Type Medicare A&B, Secondary BCBS    Progress Note Due on Visit 10    PT Start Time 0849    PT Stop Time 0930    PT Time Calculation (min) 41 min    Equipment Utilized During Treatment Gait belt    Activity Tolerance Patient tolerated treatment well    Behavior During Therapy WFL for tasks assessed/performed          Past Medical History:  Diagnosis Date   Allergic rhinitis, cause unspecified    Benign heart murmur    GERD (gastroesophageal reflux disease)    Hypertension    Inflamed sebaceous cyst    Insomnia, unspecified    Macular degeneration, wet (HCC)    Left eye   Urine incontinence    Vitamin D deficiency    Past Surgical History:  Procedure Laterality Date   APPENDECTOMY     BACK SURGERY     CERVICAL SPINE SURGERY     COLONOSCOPY  10/11   dexa scan  02/2010   DIAGNOSTIC MAMMOGRAM  04/2010   PELVIC FRACTURE SURGERY  1968   SHOULDER SURGERY     complete right, partial left   TUMOR REMOVAL     Patient Active Problem List   Diagnosis Date Noted   Primary osteoarthritis of both hands 09/28/2021   DDD (degenerative disc disease), cervical 09/21/2017   DDD (degenerative disc disease), lumbar 09/21/2017   Inflamed sebaceous cyst 12/04/2011   Hypertension 12/04/2011   GERD (gastroesophageal reflux disease) 12/04/2011   Allergic rhinitis 12/04/2011   Hyperlipidemia 12/04/2011    ONSET DATE: 2 weeks ago  REFERRING DIAG: R53.81 (ICD-10-CM) - Other malaise  THERAPY DIAG:  Other symptoms and signs involving the  musculoskeletal system  Other disturbances of skin sensation  Other lack of coordination  History of falling  Muscle weakness (generalized)  Other abnormalities of gait and mobility  Unsteadiness on feet  Rationale for Evaluation and Treatment: Rehabilitation  SUBJECTIVE:  SUBJECTIVE STATEMENT: Pt denies pain or recent falls.  She has some generalized aching in the mornings due to arthritis, but she states Tylenol and a warm shower and she usually feels better. Pt accompanied by: self (drives herself)  PERTINENT HISTORY: HTN, GERD, low vision, HLD, cervical and lumbar DDD, OA of both hands, bilateral hearing aides  PAIN:  Are you having pain? No  PRECAUTIONS: Fall  RED FLAGS: None   WEIGHT BEARING RESTRICTIONS: No  FALLS: Has patient fallen in last 6 months? Yes. Number of falls 1 - tripped over german shepherd  LIVING ENVIRONMENT: Lives with: lives with their spouse Lives in: House/apartment Stairs: Yes: Internal: 3 flights steps; bilateral railing; she uses elevator in home to get from basement to 1st/2nd floor and External: 4 steps; bilateral but cannot reach both Has following equipment at home: Single point cane, Wheelchair (manual), Shower bench, Grab bars, and walking stick  PLOF: Independent  PATIENT GOALS: to not be afraid of falling  OBJECTIVE:  Note: Objective measures were completed at Evaluation unless otherwise noted.  DIAGNOSTIC FINDINGS: No recent or relevant imaging.  COGNITION: Overall cognitive status: Within functional limits for tasks assessed   SENSATION: Light touch: WFL  COORDINATION: LE RAMS:  WNL Bilateral Heel-to-Shin:  WNL  EDEMA:  None significant in BLE  MUSCLE TONE: None noted in BLE  POSTURE: forward head (mild)  LOWER EXTREMITY  ROM:     Active  Right Eval Left Eval  Hip flexion Grossly WNL  Hip extension   Hip abduction   Hip adduction   Hip internal rotation   Hip external rotation   Knee flexion   Knee extension   Ankle dorsiflexion   Ankle plantarflexion   Ankle inversion    Ankle eversion     (Blank rows = not tested)  LOWER EXTREMITY MMT:    MMT Right Eval Left Eval  Hip flexion 4 3+  Hip extension    Hip abduction    Hip adduction    Hip internal rotation    Hip external rotation    Knee flexion    Knee extension 4+; crepitus 5  Ankle dorsiflexion 5 5  Ankle plantarflexion    Ankle inversion    Ankle eversion    (Blank rows = not tested)  BED MOBILITY:  Findings: Sit to supine Complete Independence Supine to sit Complete Independence Rolling to Right Complete Independence Rolling to Left Complete Independence  TRANSFERS: Sit to stand: Complete Independence  Assistive device utilized: None     Stand to sit: Complete Independence  Assistive device utilized: None     Chair to chair: Complete Independence  Assistive device utilized: None       RAMP:  Not tested  CURB:  Not tested  STAIRS: Not tested GAIT: Findings: Gait Characteristics: step through pattern, decreased hip/knee flexion- Right, decreased hip/knee flexion- Left, and narrow BOS, Distance walked: various clinic distances, Assistive device utilized:None, Level of assistance: Complete Independence, and Comments: Mild right drift at very end of pathway.  FUNCTIONAL TESTS:  5 times sit to stand: 15.04 sec w/ BUE support 10 meter walk test: 7.50 sec SBA no AD = 1.33 m/sec OR 4.4 ft/sec Functional gait assessment:  TBA  Interpretation of scores: Non-Specific Older Adults Cutoff Score: <=22/30 = risk of falls Parkinson's Disease Cutoff score <15/30= fall risk (Hoehn & Yahr 1-4)  Minimally Clinically Important Difference (MCID)  Stroke (acute, subacute, and chronic) = MDC: 4.2 points Vestibular (acute) = MDC: 6  points [employer]  Older Adults =  MCID: 4 points Parkinson's Disease  =  MDC: 4.3 points  (Academy of Neurologic Physical Therapy (nd). Functional Gait Assessment. Retrieved from https://www.neuropt.org/docs/default-source/cpgs/core-outcome-measures/function-gait-assessment-pocket-guide-proof9-(2).pdf?sfvrsn=b26f35043_0.)  MCTSIB: TBA  PATIENT SURVEYS:  ABC scale: The Activities-Specific Balance Confidence (ABC) Scale 0% 10 20 30  40 50 60 70 80 90 100% No confidence<->completely confident  "How confident are you that you will not lose your balance or become unsteady when you . . .   Date tested 08/01/2023  Walk around the house 100%  2. Walk up or down stairs 50%  3. Bend over and pick up a slipper from in front of a closet floor 100%  4. Reach for a small can off a shelf at eye level 100%  5. Stand on tip toes and reach for something above your head 80%  6. Stand on a chair and reach for something 100%  7. Sweep the floor 100%  8. Walk outside the house to a car parked in the driveway 90%  9. Get into or out of a car 100%  10. Walk across a parking lot to the mall 100%  11. Walk up or down a ramp 50%  12. Walk in a crowded mall where people rapidly walk past you 100%  13. Are bumped into by people as you walk through the mall 90%  14. Step onto or off of an escalator while you are holding onto the railing 100%  15. Step onto or off an escalator while holding onto parcels such that you cannot hold onto the railing 100%  16. Walk outside on icy sidewalks 0%  Total: #/16 85%                                                                                                                                 TREATMENT DATE: 08/15/2023  FGA:  [facility] PT Assessment - 08/15/23 0904       Functional Gait  Assessment   Gait assessed  Yes    Gait Level Surface Walks 20 ft in less than 5.5 sec, no assistive devices, good speed, no evidence for imbalance, normal gait pattern, deviates no  more than 6 in outside of the 12 in walkway width.    Change in Gait Speed Able to smoothly change walking speed without loss of balance or gait deviation. Deviate no more than 6 in outside of the 12 in walkway width.    Gait with Horizontal Head Turns Performs head turns smoothly with no change in gait. Deviates no more than 6 in outside 12 in walkway width    Gait with Vertical Head Turns Performs task with slight change in gait velocity (eg, minor disruption to smooth gait path), deviates 6 - 10 in outside 12 in walkway width or uses assistive device    Gait and Pivot Turn Pivot turns safely within 3 sec and stops quickly with no loss of balance.    Step Over  Obstacle Is able to step over one shoe box (4.5 in total height) but must slow down and adjust steps to clear box safely. May require verbal cueing.    Gait with Narrow Base of Support Ambulates less than 4 steps heel to toe or cannot perform without assistance.    Gait with Eyes Closed Walks 20 ft, uses assistive device, slower speed, mild gait deviations, deviates 6-10 in outside 12 in walkway width. Ambulates 20 ft in less than 9 sec but greater than 7 sec.    Ambulating Backwards Walks 20 ft, uses assistive device, slower speed, mild gait deviations, deviates 6-10 in outside 12 in walkway width.    Steps Alternating feet, must use rail.    Total Score 21    FGA comment: 21/30 = moderate fall risk         mCTSIB (average of 3 trials): -Condition 1: 30 seconds -Condition 2: 30 seconds -Condition 3: 30 seconds -Condition 4: 23.33 seconds  -Verbally reviewed HEP established based on deficits noted today - see HEP section below.  -SciFit using BUE/BLE in multi-peaks mode up to level 5.0 working on dynamic cardiovascular endurance and global strengthening.  RPE 6/10 following task maintaining pace of 70-100 steps/min throughout task.  PATIENT EDUCATION: Education details: Outcome interpretations and goals set.  Initial HEP. Person  educated: Patient Education method: Medical illustrator Education comprehension: verbalized understanding  HOME EXERCISE PROGRAM: Access Code: [email or web address] URL: [email or web address] Date: 08/15/2023 Prepared by: Daved Bull  Exercises - Sit to Stand  - 1 x daily - 7 x weekly - 1-2 sets - 10 reps - Tandem Walking with Counter Support  - 1 x daily - 4 x weekly - 3 sets - 10 reps - Backward Walking with Counter Support  - 1 x daily - 4 x weekly - 3 sets - 10 reps - Walking with Eyes Closed and Counter Support  - 1 x daily - 4 x weekly - 3 sets - 10 reps - Standing Near Stance in Corner with Eyes Closed  - 1 x daily - 4 x weekly - 1 sets - 3 reps - 30 seconds hold  GOALS: Goals reviewed with patient? Yes  SHORT TERM GOALS: Target date: 08/31/2023  Pt will be independent and compliant with introductory strength and balance HEP in order to maintain functional progress and improve mobility. Baseline:  Informally established on eval. Goal status: INITIAL  2.  Pt will improve FGA score to 24/30 in order to demonstrate improved balance and decreased fall risk. Baseline: 21/30 (8/13) Goal status: INITIAL  3.  mCTSIB to be assessed w/ STG set as appropriate. Baseline: LTG only Goal status: Revised - D/C'd (8/13)  LONG TERM GOALS: Target date: 09/28/2023  Pt will be independent and compliant with advanced and finalized strength and balance HEP in order to maintain functional progress and improve mobility. Baseline: Informally established on eval. Goal status: INITIAL  2.  Pt will improve ABC Scale score to >/=95% in order to decrease fall risk by demonstrating decreased fear of falling. Baseline: 85% Goal status: INITIAL  3.  Pt will improve FGA score to 27/30 in order to demonstrate improved balance and decreased fall risk. Baseline: 21/30 (8/13) Goal status: INITIAL  4.  Pt to complete condition 4 of mCTSIB with average of 3 trials of 30 seconds in order  to demonstrate improved vestibular function in relation to static balance. Baseline: 23.33 seconds avg (8/13) Goal status: INITIAL  5.  Pt will decrease 5xSTS to </=  12 seconds w/o UE support in order to demonstrate decreased risk for falls and improved functional bilateral LE strength and power. Baseline: 15.04 sec w/ BUE support Goal status: INITIAL  ASSESSMENT:  CLINICAL IMPRESSION: Emphasis os skilled session today on capturing metrics not able to be assessed on evaluation.  She performs in moderate fall risk category scoring 21/30 and has most difficulty with condition 4 of mCTSIB demonstrating some increased vestibular challenge.  She was challenged most by narrowed BOS, visually limited ambulation, and stair management and confidence on FGA.  Established formal HEP based on deficits noted today and will expand to progress LE strength as needed in coming visits.  Continue per POC.  OBJECTIVE IMPAIRMENTS: Abnormal gait, decreased balance, decreased knowledge of use of DME, decreased strength, impaired vision/preception, improper body mechanics, and postural dysfunction.   ACTIVITY LIMITATIONS: carrying, lifting, standing, squatting, stairs, reach over head, and locomotion level  PARTICIPATION LIMITATIONS: meal prep, cleaning, community activity, and yard work  PERSONAL FACTORS: Age, Fitness, Past/current experiences, and 1-2 comorbidities: low vision, impaired hearing are also affecting patient's functional outcome.   REHAB POTENTIAL: Excellent  CLINICAL DECISION MAKING: Evolving/moderate complexity  EVALUATION COMPLEXITY: Moderate  PLAN:  PT FREQUENCY: 1x/week  PT DURATION: 8 weeks  PLANNED INTERVENTIONS: 97164- PT Re-evaluation, 97750- Physical Performance Testing, 97110-Therapeutic exercises, 97530- Therapeutic activity, [id number]- Neuromuscular re-education, 97535- Self Care, [id number]- Manual therapy, [phone]- Gait training, Patient/Family education, Balance training, Stair training,  Vestibular training, and DME instructions  PLAN FOR NEXT SESSION: SPC - folding options for stairs vs rollator outdoors.  Core engagement per pt request.  STS no UE support.  Expand HEP for high level balance and BLE strength.   Daved KATHEE Bull, PT, DPT 08/15/2023, 9:31 AM

## 2023-08-15 NOTE — Patient Instructions (Signed)
 Access Code: [email or web address] URL: [email or web address] Date: 08/15/2023 Prepared by: Daved Bull  Exercises - Sit to Stand  - 1 x daily - 7 x weekly - 1-2 sets - 10 reps - Tandem Walking with Counter Support  - 1 x daily - 4 x weekly - 3 sets - 10 reps - Backward Walking with Counter Support  - 1 x daily - 4 x weekly - 3 sets - 10 reps - Walking with Eyes Closed and Counter Support  - 1 x daily - 4 x weekly - 3 sets - 10 reps - Standing Near Stance in Corner with Eyes Closed  - 1 x daily - 4 x weekly - 1 sets - 3 reps - 30 seconds hold

## 2023-08-20 ENCOUNTER — Ambulatory Visit: Admitting: Occupational Therapy

## 2023-08-20 ENCOUNTER — Ambulatory Visit: Admitting: Physical Therapy

## 2023-08-20 ENCOUNTER — Encounter: Payer: Self-pay | Admitting: Physical Therapy

## 2023-08-20 DIAGNOSIS — M25542 Pain in joints of left hand: Secondary | ICD-10-CM

## 2023-08-20 DIAGNOSIS — R29898 Other symptoms and signs involving the musculoskeletal system: Secondary | ICD-10-CM | POA: Diagnosis not present

## 2023-08-20 DIAGNOSIS — R2681 Unsteadiness on feet: Secondary | ICD-10-CM

## 2023-08-20 DIAGNOSIS — H3530 Unspecified macular degeneration: Secondary | ICD-10-CM

## 2023-08-20 DIAGNOSIS — R41842 Visuospatial deficit: Secondary | ICD-10-CM

## 2023-08-20 DIAGNOSIS — Z9181 History of falling: Secondary | ICD-10-CM

## 2023-08-20 DIAGNOSIS — R278 Other lack of coordination: Secondary | ICD-10-CM

## 2023-08-20 DIAGNOSIS — R2689 Other abnormalities of gait and mobility: Secondary | ICD-10-CM

## 2023-08-20 DIAGNOSIS — M6281 Muscle weakness (generalized): Secondary | ICD-10-CM

## 2023-08-20 DIAGNOSIS — R208 Other disturbances of skin sensation: Secondary | ICD-10-CM

## 2023-08-20 NOTE — Therapy (Signed)
 OUTPATIENT PHYSICAL THERAPY NEURO TREATMENT   Patient Name: Brittney Stewart MRN: [id number] DOB:06/29/1940, 83 y.o., female Today's Date: 08/20/2023   PCP: Roanna Ezekiel NOVAK, MD REFERRING PROVIDER: Roanna Ezekiel NOVAK, MD  END OF SESSION:  PT End of Session - 08/20/23 0847     Visit Number 3    Number of Visits 9   8 + eval   Date for PT Re-Evaluation 10/12/23   Pushed out due to potential multi-disciplinary scheduling delay   Authorization Type Medicare A&B, Secondary BCBS    Progress Note Due on Visit 10    PT Start Time 0845    PT Stop Time 0929    PT Time Calculation (min) 44 min    Equipment Utilized During Treatment Gait belt    Activity Tolerance Patient tolerated treatment well    Behavior During Therapy WFL for tasks assessed/performed          Past Medical History:  Diagnosis Date   Allergic rhinitis, cause unspecified    Benign heart murmur    GERD (gastroesophageal reflux disease)    Hypertension    Inflamed sebaceous cyst    Insomnia, unspecified    Macular degeneration, wet (HCC)    Left eye   Urine incontinence    Vitamin D deficiency    Past Surgical History:  Procedure Laterality Date   APPENDECTOMY     BACK SURGERY     CERVICAL SPINE SURGERY     COLONOSCOPY  10/11   dexa scan  02/2010   DIAGNOSTIC MAMMOGRAM  04/2010   PELVIC FRACTURE SURGERY  1968   SHOULDER SURGERY     complete right, partial left   TUMOR REMOVAL     Patient Active Problem List   Diagnosis Date Noted   Primary osteoarthritis of both hands 09/28/2021   DDD (degenerative disc disease), cervical 09/21/2017   DDD (degenerative disc disease), lumbar 09/21/2017   Inflamed sebaceous cyst 12/04/2011   Hypertension 12/04/2011   GERD (gastroesophageal reflux disease) 12/04/2011   Allergic rhinitis 12/04/2011   Hyperlipidemia 12/04/2011    ONSET DATE: 2 weeks ago  REFERRING DIAG: R53.81 (ICD-10-CM) - Other malaise  THERAPY DIAG:  Other symptoms and signs involving the  musculoskeletal system  Other disturbances of skin sensation  Other lack of coordination  History of falling  Muscle weakness (generalized)  Other abnormalities of gait and mobility  Unsteadiness on feet  Rationale for Evaluation and Treatment: Rehabilitation  SUBJECTIVE:  SUBJECTIVE STATEMENT: Pt denies pain or recent falls.  She had a busy weekend, but is doing well.  My dog keeps stepping on my foot and I think it's slowing me down.  I do have a big old bruise on my foot because he's 110lbs.  She continues working on [employer], but tandem remains very hard for her. Pt accompanied by: self (drives herself)  PERTINENT HISTORY: HTN, GERD, low vision, HLD, cervical and lumbar DDD, OA of both hands, bilateral hearing aides  PAIN:  Are you having pain? No  PRECAUTIONS: Fall  RED FLAGS: None   WEIGHT BEARING RESTRICTIONS: No  FALLS: Has patient fallen in last 6 months? Yes. Number of falls 1 - tripped over german shepherd  LIVING ENVIRONMENT: Lives with: lives with their spouse Lives in: House/apartment Stairs: Yes: Internal: 3 flights steps; bilateral railing; she uses elevator in home to get from basement to 1st/2nd floor and External: 4 steps; bilateral but cannot reach both Has following equipment at home: Single point cane, Wheelchair (manual), Shower bench, Grab bars, and walking stick  PLOF: Independent  PATIENT GOALS: to not be afraid of falling  OBJECTIVE:  Note: Objective measures were completed at Evaluation unless otherwise noted.  DIAGNOSTIC FINDINGS: No recent or relevant imaging.  COGNITION: Overall cognitive status: Within functional limits for tasks assessed   SENSATION: Light touch: WFL  COORDINATION: LE RAMS:  WNL Bilateral Heel-to-Shin:  WNL  EDEMA:  None  significant in BLE  MUSCLE TONE: None noted in BLE  POSTURE: forward head (mild)  LOWER EXTREMITY ROM:     Active  Right Eval Left Eval  Hip flexion Grossly WNL  Hip extension   Hip abduction   Hip adduction   Hip internal rotation   Hip external rotation   Knee flexion   Knee extension   Ankle dorsiflexion   Ankle plantarflexion   Ankle inversion    Ankle eversion     (Blank rows = not tested)  LOWER EXTREMITY MMT:    MMT Right Eval Left Eval  Hip flexion 4 3+  Hip extension    Hip abduction    Hip adduction    Hip internal rotation    Hip external rotation    Knee flexion    Knee extension 4+; crepitus 5  Ankle dorsiflexion 5 5  Ankle plantarflexion    Ankle inversion    Ankle eversion    (Blank rows = not tested)  BED MOBILITY:  Findings: Sit to supine Complete Independence Supine to sit Complete Independence Rolling to Right Complete Independence Rolling to Left Complete Independence  TRANSFERS: Sit to stand: Complete Independence  Assistive device utilized: None     Stand to sit: Complete Independence  Assistive device utilized: None     Chair to chair: Complete Independence  Assistive device utilized: None       RAMP:  Not tested  CURB:  Not tested  STAIRS: Not tested GAIT: Findings: Gait Characteristics: step through pattern, decreased hip/knee flexion- Right, decreased hip/knee flexion- Left, and narrow BOS, Distance walked: various clinic distances, Assistive device utilized:None, Level of assistance: Complete Independence, and Comments: Mild right drift at very end of pathway.  FUNCTIONAL TESTS:  5 times sit to stand: 15.04 sec w/ BUE support 10 meter walk test: 7.50 sec SBA no AD = 1.33 m/sec OR 4.4 ft/sec Functional gait assessment:  TBA  Interpretation of scores: Non-Specific Older Adults Cutoff Score: <=22/30 = risk of falls Parkinson's Disease Cutoff score <15/30= fall risk (Hoehn &  Yahr 1-4)  Minimally Clinically Important  Difference (MCID)  Stroke (acute, subacute, and chronic) = MDC: 4.2 points Vestibular (acute) = MDC: 6 points Community Dwelling Older Adults =  MCID: 4 points Parkinson's Disease  =  MDC: 4.3 points  (Academy of Neurologic Physical Therapy (nd). Functional Gait Assessment. Retrieved from https://www.neuropt.org/docs/default-source/cpgs/core-outcome-measures/function-gait-assessment-pocket-guide-proof9-(2).pdf?sfvrsn=b41f35043_0.)  MCTSIB: TBA  PATIENT SURVEYS:  ABC scale: The Activities-Specific Balance Confidence (ABC) Scale 0% 10 20 30  40 50 60 70 80 90 100% No confidence<->completely confident  "How confident are you that you will not lose your balance or become unsteady when you . . .   Date tested 08/01/2023  Walk around the house 100%  2. Walk up or down stairs 50%  3. Bend over and pick up a slipper from in front of a closet floor 100%  4. Reach for a small can off a shelf at eye level 100%  5. Stand on tip toes and reach for something above your head 80%  6. Stand on a chair and reach for something 100%  7. Sweep the floor 100%  8. Walk outside the house to a car parked in the driveway 90%  9. Get into or out of a car 100%  10. Walk across a parking lot to the mall 100%  11. Walk up or down a ramp 50%  12. Walk in a crowded mall where people rapidly walk past you 100%  13. Are bumped into by people as you walk through the mall 90%  14. Step onto or off of an escalator while you are holding onto the railing 100%  15. Step onto or off an escalator while holding onto parcels such that you cannot hold onto the railing 100%  16. Walk outside on icy sidewalks 0%  Total: #/16 85%                                                                                                                                 TREATMENT DATE: 08/20/2023  -SciFit using BUE/BLE in multi-peaks mode up to level 6.0 for dynamic cardiovascular warmup and global strengthening.  RPE 5/10 following task  maintaining pace of 80-100 steps/min throughout task. -STS variable UE support 2x5, increased time for return demo and multimodal cuing to improve form and sequencing, has pt focus on eccentric lower which is inconsistent throughout attempts due to functional weakness and knee pain -Seated march w/ contralateral overhead raise x20 > standing march w/ contralateral overhead raise 2x20  RAMP:  Level of Assistance: Modified independence Assistive device utilized: Hurrycane Ramp Comments: performed x2 w/ pt demonstrating good stride and stability with ascent and descent.  CURB:  Level of Assistance: Modified independence Assistive device utilized: Hurrycane Curb Comments: Pt has ideal sequencing and carryover from stair trials to curb step.  Performed x1 w/o LOB and pt reports confidence with cane option.  Discussed ways to adjust personal cane for appropriate height if she chooses to purchase  OOP.  STAIRS:  Level of Assistance: SBA and CGA  Stair Negotiation Technique: Step to Pattern Sideways Forwards With use of AD: Hurrycane with No Rails  Number of Stairs: 4x3   Height of Stairs: 6  Comments: Cues for sequencing.  Pt has singular posterior LOB with initial descent requiring CGA to steady.  She does much better with slight lateral body angle on descent.  Steady ascent facing forward.  She decline trial of SPC/quad standalone cane for stairs in favor of Hurrycane.  GAIT: Gait pattern: WFL Distance walked: 20 ft x2 Assistive device utilized: Hurrycane Level of assistance: Modified independence Comments: Pt demonstrates good sequencing with min cues.  Good stride over level indoor surfaces.   PATIENT EDUCATION: Education details: Continue HEP.  OOP Hurrycane purchase options. Person educated: Patient Education method: Medical illustrator Education comprehension: verbalized understanding  HOME EXERCISE PROGRAM: Access Code: [email or web address] URL:  [email or web address] Date: 08/15/2023 Prepared by: Daved Bull  Exercises - Sit to Stand  - 1 x daily - 7 x weekly - 1-2 sets - 10 reps - Tandem Walking with Counter Support  - 1 x daily - 4 x weekly - 3 sets - 10 reps - Backward Walking with Counter Support  - 1 x daily - 4 x weekly - 3 sets - 10 reps - Walking with Eyes Closed and Counter Support  - 1 x daily - 4 x weekly - 3 sets - 10 reps - Standing Near Stance in Corner with Eyes Closed  - 1 x daily - 4 x weekly - 1 sets - 3 reps - 30 seconds hold  GOALS: Goals reviewed with patient? Yes  SHORT TERM GOALS: Target date: 08/31/2023  Pt will be independent and compliant with introductory strength and balance HEP in order to maintain functional progress and improve mobility. Baseline:  Informally established on eval. Goal status: INITIAL  2.  Pt will improve FGA score to 24/30 in order to demonstrate improved balance and decreased fall risk. Baseline: 21/30 (8/13) Goal status: INITIAL  3.  mCTSIB to be assessed w/ STG set as appropriate. Baseline: LTG only Goal status: Revised - D/C'd (8/13)  LONG TERM GOALS: Target date: 09/28/2023  Pt will be independent and compliant with advanced and finalized strength and balance HEP in order to maintain functional progress and improve mobility. Baseline: Informally established on eval. Goal status: INITIAL  2.  Pt will improve ABC Scale score to >/=95% in order to decrease fall risk by demonstrating decreased fear of falling. Baseline: 85% Goal status: INITIAL  3.  Pt will improve FGA score to 27/30 in order to demonstrate improved balance and decreased fall risk. Baseline: 21/30 (8/13) Goal status: INITIAL  4.  Pt to complete condition 4 of mCTSIB with average of 3 trials of 30 seconds in order to demonstrate improved vestibular function in relation to static balance. Baseline: 23.33 seconds avg (8/13) Goal status: INITIAL  5.  Pt will decrease 5xSTS to </=12  seconds w/o UE support in order to demonstrate decreased risk for falls and improved functional bilateral LE strength and power. Baseline: 15.04 sec w/ BUE support Goal status: INITIAL  ASSESSMENT:  CLINICAL IMPRESSION: Focus of skilled session on ongoing work to strengthen core and BLE.  She would benefit from further work on quad and glut strengthening as well as muscular endurance to support return to high level traveling activities.  She is receptive and confident with Hurrycane option used today and would likely benefit from further stair trials.  Continue per POC.  OBJECTIVE IMPAIRMENTS: Abnormal gait, decreased balance, decreased knowledge of use of DME, decreased strength, impaired vision/preception, improper body mechanics, and postural dysfunction.   ACTIVITY LIMITATIONS: carrying, lifting, standing, squatting, stairs, reach over head, and locomotion level  PARTICIPATION LIMITATIONS: meal prep, cleaning, community activity, and yard work  PERSONAL FACTORS: Age, Fitness, Past/current experiences, and 1-2 comorbidities: low vision, impaired hearing are also affecting patient's functional outcome.   REHAB POTENTIAL: Excellent  CLINICAL DECISION MAKING: Evolving/moderate complexity  EVALUATION COMPLEXITY: Moderate  PLAN:  PT FREQUENCY: 1x/week  PT DURATION: 8 weeks  PLANNED INTERVENTIONS: 97164- PT Re-evaluation, 97750- Physical Performance Testing, 97110-Therapeutic exercises, 97530- Therapeutic activity, [id number]- Neuromuscular re-education, 97535- Self Care, [id number]- Manual therapy, [phone]- Gait training, Patient/Family education, Balance training, Stair training, Vestibular training, and DME instructions  PLAN FOR NEXT SESSION: Hurrycane stair descent.  Core engagement per pt request.  STS no UE support.  Expand HEP for high level balance and BLE strength.  Tandem stance tasks.  Glut/quad strength.  SciFit/treadmill endurance.   Daved KATHEE Bull, PT, DPT 08/20/2023, 9:32  AM

## 2023-08-20 NOTE — Therapy (Signed)
 OUTPATIENT OCCUPATIONAL THERAPY LOW VISION TREATMENT  Patient Name: Brittney Stewart MRN: [id number] DOB:1940/08/14, 83 y.o., female Today's Date: 08/20/2023  PCP: Roanna Ezekiel NOVAK, MD REFERRING PROVIDER: Roanna Ezekiel NOVAK, MD  END OF SESSION:  OT End of Session - 08/20/23 0805     Visit Number 3    Number of Visits 10    Date for OT Re-Evaluation 10/05/23    Authorization Type Medicare A&B/BCBS Supp 2025 Covered 100% Follow Medicare Guidelines    Progress Note Due on Visit 10    OT Start Time 0802    OT Stop Time 0845    OT Time Calculation (min) 43 min    Activity Tolerance Patient tolerated treatment well    Behavior During Therapy WFL for tasks assessed/performed          Past Medical History:  Diagnosis Date   Allergic rhinitis, cause unspecified    Benign heart murmur    GERD (gastroesophageal reflux disease)    Hypertension    Inflamed sebaceous cyst    Insomnia, unspecified    Macular degeneration, wet (HCC)    Left eye   Urine incontinence    Vitamin D deficiency    Past Surgical History:  Procedure Laterality Date   APPENDECTOMY     BACK SURGERY     CERVICAL SPINE SURGERY     COLONOSCOPY  10/11   dexa scan  02/2010   DIAGNOSTIC MAMMOGRAM  04/2010   PELVIC FRACTURE SURGERY  1968   SHOULDER SURGERY     complete right, partial left   TUMOR REMOVAL     Patient Active Problem List   Diagnosis Date Noted   Primary osteoarthritis of both hands 09/28/2021   DDD (degenerative disc disease), cervical 09/21/2017   DDD (degenerative disc disease), lumbar 09/21/2017   Inflamed sebaceous cyst 12/04/2011   Hypertension 12/04/2011   GERD (gastroesophageal reflux disease) 12/04/2011   Allergic rhinitis 12/04/2011   Hyperlipidemia 12/04/2011    ONSET DATE: 07/11/2023  REFERRING DIAG: R53.81 (ICD-10-CM) - Other malaise  THERAPY DIAG:  Visuospatial deficit  Macular degeneration of both eyes, unspecified type  Pain in joint of left hand  Rationale for  Evaluation and Treatment: Rehabilitation  SUBJECTIVE:   SUBJECTIVE STATEMENT:  Pt reports she has an appointment this Thursday for new Rx.  She reports possible eye strain last week due to reading but pt reports she did not use her magnify device and the font was probably too small.  She had to drive to [facility] yesterday and noticed the fog and sun was a challenge at times for signage.  Pt reports she has ordered the tactile dots for her appliances to help identify the buttons.  In addition, pt has been getting some help to organize her iPhone apps alphabetically and she reported this is helping but they are still working on finishing this.   Pt accompanied by: self  PERTINENT HISTORY:   Per MD report 2024: Stable age related macular degeneration. Need to use vitamins and monitor with Amsler grid. Follow with Dr Renell. Have not seen most recent notes but pt reports injections now OU. Sounds like antiVEGF OD and newer drug for dry OS.  Seen previously for OT in 2024 - She had been taking recorder lessons which was causing discomfort in her bilateral CMC joints. She had stiffness in her hands. She had been using a hand massager which helps to some extent. She continued to have some discomfort in her left shoulder joint which was replaced. She  had difficulty sleeping on the left side.   Dupuytren's contracture of right hand-asymptomatic. H/O total shoulder replacement, right. H/O of left shoulder hemiarthroplasty. Rheumatoid factor positive   PRECAUTIONS: Fall  WEIGHT BEARING RESTRICTIONS: No  PAIN:  Are you having pain? No  FALLS: Has patient fallen in last 6 months? Yes. Number of falls 1 tripped over dog - 110 lb [place] - 2 weeks ago and landed on tile floor and managed to get up but she twisted funny and had pain on her side but that seems to be ok - minor twinges.   LIVING ENVIRONMENT: Lives with: lives with their spouse Lives in: House Stairs: Yes: External: 4  steps; bilateral but cannot reach both - Indoors equipped with elevator Has following equipment at home: Wheelchair (manual) and perhaps walker that belonged to her mother  PLOF: Independent - Pt reports 3000-4000 steps/day, active in music community, including several recorder ensembles.  Pt was headed to a [employer] after OT/PT evaluations.  PATIENT GOALS: Not sure - but knows she could use some recommendations for her vision ie) needs brighter light to read, being able to make fonts bigger etc.  Trouble reading directions esp red and in the dark, buttons on stoves are hard to read.  OBJECTIVE:  Note: Objective measures were completed at Evaluation unless otherwise noted.  GLASSES: bifocals  CURRENT MODIFICATIONS/ADAPTIONS: backlit Ipad for reading music  DEVICES: Ipad, light with magnifier, flashlight hanging on keychain  PHONE: Iphone - reported awareness of magnifier  HAND DOMINANCE: Right  ADLs: Overall ADLs: Pt rerports she is independent  Transfers/ambulation related to ADLs: Ind but will push things around for support Eating: Ind Grooming: Ind UB Dressing: Ind LB Dressing: Ind Toileting: Ind Bathing: Ind Tub Shower transfers: Walk in shower, has seat built in and grab bars - vertical and horizontal. Equipment: Shower seat with back, Grab bars, and Walk in shower  IADLs: Pt is still driving in the daytime - does not drie at night or in the rain but notes she can get assistance of a driver as needed. 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  MOBILITY STATUS: Independent and Hx of falls - no AE at this time  POSTURE COMMENTS:  rounded shoulders and forward head Sitting  balance: WFL  ACTIVITY TOLERANCE: Activity tolerance: Good  FUNCTIONAL OUTCOME MEASURES: Revised Self-Reported Assessment of Functional Visual Performance - 87% (13% impairment)  UPPER EXTREMITY ROM:   End range shoulder ROM due to history of shoulder surgeries  UPPER EXTREMITY MMT:     UE strength - WFL  HAND FUNCTION: Grip strength: Right: 47.8, 55.1,  lbs; Left: 47.1, 50.2 lbs Average: Right: 51.5 lbs; Left: 48.7 lbs  COORDINATION: 9 Hole Peg test: Right: 26.15 sec; Left: 25.29 sec  SENSATION: Aware of perhaps some numbness more in the morning  EDEMA: NA  MUSCLE TONE: WFL  COGNITION: Overall cognitive status: Within functional limits for tasks assessed  VISION: Subjective report: Pt reports injections every 2 months alternating between tx for wet and dry macular degeneration Baseline vision: Bifocals and Wears glasses all the time Visual history: macular degeneration  VISION ASSESSMENT: To be further assessed in functional context Tracking appeared [facility] with slight variations  Left  eye - has some trouble with seeing lower half of person's face  Pt admits she would give up driving if she thought she was unsafe as she reports being able to have a driver, which she does use if she is going to [facility] for appts.  Patient has difficulty with following activities due to following visual impairments:  -really relies on light to help with vision -finding dropped medication (depending on background) -pouring coffee into cups and knowing when it's full -pt made herself a bigger chart for financial record keeping -not reading as much as before due to difficulties -sometimes managing curbs -reading street signs on occasion due to lighting -more difficulty in grocery store seeing smaller items that are a distance away from her  OBSERVATIONS: Pt ambulates with no AE and no frank loss of balance but some hand support sought on occasion. The pt is well kept and has  glasses donned.                                                                                                                            TODAY'S TREATMENT:    - Self care education and pt training initiated for duration as noted below including:  OT educated pt on general vision strategies as noted in pt instructions to improve independence and safety with ADL and IADL completion secondary to visual impairment.  Reviewed options such as - improved lighting with option for full spectrum lighting provided - increased contrast - both at meals and within living environment - reduced clutter and glare - pt encouraged to discuss polarized fit-over glasses as an option for use when driving with doctor this week - use of walking stick for safety with walking - pt to review with PT - driving - OT to reach out to driving instructor to determine additional options she may consider  Also introduced joint protection ideas but not with handout this date    PATIENT EDUCATION: Education details: Low Vision recommendations Person educated: Patient Education method: Explanation, Demonstration, Verbal cues, and Handouts Education comprehension: verbalized understanding, returned demonstration, verbal cues required, and needs further education  HOME EXERCISE PROGRAM: 08/01/23: initiated education re: vision strategies and joint protection. 08/15/23: Iphone modification options 08/20/23: Low vision recommendation handouts  GOALS: Goals reviewed with patient? Yes  SHORT TERM GOALS: Target date: 09/07/23  Patient will independently recall at least 2 compensatory strategies for visual impairment without cueing. Baseline: New to outpt OT  Goal status: IN Progress  2.  Patient will return demonstration of visual adaptation use and verbalize understanding of how to obtain item(s) if desired.  Baseline: New to outpt OT  Goal status: IN Progress  3.  Patient will utilize adaptive visual strategies and/or  devices to independently and safely complete daily household tasks (e.g., medication management, pouring liquids). Baseline: New to outpt OT  Goal status: IN Progress  4.  Pt will verbalize understanding of adapted strategies and/or equipment PRN to increase safety  and independence with ADLs and IADLs for max joint protection. Baseline: history of B hand arthritis Goal status: IN Progress   LONG TERM GOALS: Target date: 10/05/23  Patient will demonstrate improved functional mobility and safety with environmental navigation (e.g., curbs, store aisles) using visual compensatory strategies in 90% of observed tasks. Baseline: New to outpt OT  Goal status: IN Progress  2.  Patient will use visual scanning, magnification, or app-based assistance to identify items on grocery shelves at mid- to far-distance in 3/5 observed tasks. Baseline: New to outpt OT  Goal status: IN Progress  3.  OT to assess and set appropriate PSFS goal.  Baseline: Pt identified areas such as buttons on appliances and need for new lamp as areas to address but did not rate impairments in these areas yet Goal status: INITIAL   ASSESSMENT:  CLINICAL IMPRESSION: Patient is a 83 y.o. female who was seen today for occupational therapy treatment for of vision related impairments due to macular degeneration.  Pt appreciative of recommendations provided today for lighting, as well as options for reducing glare and even some jt protection options today.  Pt will benefit from further skilled OT services in the outpatient setting for education on visual impairment(s), compensatory and safety strategies, and use of AD as needed for more independent and safe completion of daily occupations; as well as to address and deficits r/t arthritis etc.  PERFORMANCE DEFICITS: in functional skills including ADLs, IADLs, coordination, sensation, ROM, strength, flexibility, Fine motor control, Gross motor control, hearing, mobility, balance,  decreased knowledge of precautions, decreased knowledge of use of DME, vision, and UE functional use, cognitive skills including safety awareness, and psychosocial skills including coping strategies, environmental adaptation, habits, and routines and behaviors.   IMPAIRMENTS: are limiting patient from ADLs, IADLs, rest and sleep, work, and leisure.   CO-MORBIDITIES: has co-morbidities such as arthritis and macular degeneration that affects occupational performance. Patient will benefit from skilled OT to address above impairments and improve overall function.  REHAB POTENTIAL: Good  PLAN:  OT FREQUENCY: 1x/week  OT DURATION: 8 weeks - POC extended due to scheduling  PLANNED INTERVENTIONS: 97168 OT Re-evaluation, 97535 self care/ADL training, 02889 therapeutic exercise, 97530 therapeutic activity, 97763 Orthotic/Prosthetic subsequent, functional mobility training, visual/perceptual remediation/compensation, coping strategies training, patient/family education, and DME and/or AE instructions  RECOMMENDED OTHER SERVICES: NA - PT evaluation occurred on the same day as OT eval  CONSULTED AND AGREED WITH PLAN OF CARE: Patient  PLAN FOR NEXT SESSION:  Provide additional low vision packet Visual comp strategies/HEP AE options PSFS - buttons on stove, etc Car modifications for vision ie) signage etc    Clarita LITTIE Pride, OT 08/20/2023, 5:38 PM

## 2023-08-20 NOTE — Patient Instructions (Addendum)
 Brittney Stewart

## 2023-08-28 ENCOUNTER — Ambulatory Visit: Admitting: Occupational Therapy

## 2023-08-28 ENCOUNTER — Ambulatory Visit: Admitting: Physical Therapy

## 2023-08-28 ENCOUNTER — Encounter: Payer: Self-pay | Admitting: Physical Therapy

## 2023-08-28 DIAGNOSIS — R2681 Unsteadiness on feet: Secondary | ICD-10-CM

## 2023-08-28 DIAGNOSIS — M6281 Muscle weakness (generalized): Secondary | ICD-10-CM

## 2023-08-28 DIAGNOSIS — R2689 Other abnormalities of gait and mobility: Secondary | ICD-10-CM

## 2023-08-28 DIAGNOSIS — R29898 Other symptoms and signs involving the musculoskeletal system: Secondary | ICD-10-CM | POA: Diagnosis not present

## 2023-08-28 DIAGNOSIS — R278 Other lack of coordination: Secondary | ICD-10-CM

## 2023-08-28 DIAGNOSIS — Z9181 History of falling: Secondary | ICD-10-CM

## 2023-08-28 DIAGNOSIS — M25542 Pain in joints of left hand: Secondary | ICD-10-CM

## 2023-08-28 DIAGNOSIS — R41842 Visuospatial deficit: Secondary | ICD-10-CM

## 2023-08-28 NOTE — Therapy (Signed)
 OUTPATIENT PHYSICAL THERAPY NEURO TREATMENT   Patient Name: Brittney Stewart MRN: [id number] DOB:10-15-1940, 83 y.o., female Today's Date: 08/28/2023   PCP: Roanna Ezekiel NOVAK, MD REFERRING PROVIDER: Roanna Ezekiel NOVAK, MD  END OF SESSION:  PT End of Session - 08/28/23 0941     Visit Number 4    Number of Visits 9   8 + eval   Date for PT Re-Evaluation 10/12/23   Pushed out due to potential multi-disciplinary scheduling delay   Authorization Type Medicare A&B, Secondary BCBS    Progress Note Due on Visit 10    PT Start Time [phone]   PT managing prior clinic need   PT Stop Time 1020    PT Time Calculation (min) 41 min    Equipment Utilized During Treatment Gait belt    Activity Tolerance Patient tolerated treatment well    Behavior During Therapy WFL for tasks assessed/performed          Past Medical History:  Diagnosis Date   Allergic rhinitis, cause unspecified    Benign heart murmur    GERD (gastroesophageal reflux disease)    Hypertension    Inflamed sebaceous cyst    Insomnia, unspecified    Macular degeneration, wet (HCC)    Left eye   Urine incontinence    Vitamin D deficiency    Past Surgical History:  Procedure Laterality Date   APPENDECTOMY     BACK SURGERY     CERVICAL SPINE SURGERY     COLONOSCOPY  10/11   dexa scan  02/2010   DIAGNOSTIC MAMMOGRAM  04/2010   PELVIC FRACTURE SURGERY  1968   SHOULDER SURGERY     complete right, partial left   TUMOR REMOVAL     Patient Active Problem List   Diagnosis Date Noted   Primary osteoarthritis of both hands 09/28/2021   DDD (degenerative disc disease), cervical 09/21/2017   DDD (degenerative disc disease), lumbar 09/21/2017   Inflamed sebaceous cyst 12/04/2011   Hypertension 12/04/2011   GERD (gastroesophageal reflux disease) 12/04/2011   Allergic rhinitis 12/04/2011   Hyperlipidemia 12/04/2011    ONSET DATE: 2 weeks ago  REFERRING DIAG: R53.81 (ICD-10-CM) - Other malaise  THERAPY DIAG:  Other lack  of coordination  History of falling  Muscle weakness (generalized)  Other abnormalities of gait and mobility  Unsteadiness on feet  Rationale for Evaluation and Treatment: Rehabilitation  SUBJECTIVE:                                                                                                                                                                                             SUBJECTIVE STATEMENT:  Pt denies pain or recent falls.  She tripped over her dogs ball, but did not fall.  She reports she forgot to bring her cane today. Pt accompanied by: self (drives herself)  PERTINENT HISTORY: HTN, GERD, low vision, HLD, cervical and lumbar DDD, OA of both hands, bilateral hearing aides  PAIN:  Are you having pain? No  PRECAUTIONS: Fall  RED FLAGS: None   WEIGHT BEARING RESTRICTIONS: No  FALLS: Has patient fallen in last 6 months? Yes. Number of falls 1 - tripped over german shepherd  LIVING ENVIRONMENT: Lives with: lives with their spouse Lives in: House/apartment Stairs: Yes: Internal: 3 flights steps; bilateral railing; she uses elevator in home to get from basement to 1st/2nd floor and External: 4 steps; bilateral but cannot reach both Has following equipment at home: Single point cane, Wheelchair (manual), Shower bench, Grab bars, and walking stick  PLOF: Independent  PATIENT GOALS: to not be afraid of falling  OBJECTIVE:  Note: Objective measures were completed at Evaluation unless otherwise noted.  DIAGNOSTIC FINDINGS: No recent or relevant imaging.  COGNITION: Overall cognitive status: Within functional limits for tasks assessed   SENSATION: Light touch: WFL  COORDINATION: LE RAMS:  WNL Bilateral Heel-to-Shin:  WNL  EDEMA:  None significant in BLE  MUSCLE TONE: None noted in BLE  POSTURE: forward head (mild)  LOWER EXTREMITY ROM:     Active  Right Eval Left Eval  Hip flexion Grossly WNL  Hip extension   Hip abduction   Hip adduction    Hip internal rotation   Hip external rotation   Knee flexion   Knee extension   Ankle dorsiflexion   Ankle plantarflexion   Ankle inversion    Ankle eversion     (Blank rows = not tested)  LOWER EXTREMITY MMT:    MMT Right Eval Left Eval  Hip flexion 4 3+  Hip extension    Hip abduction    Hip adduction    Hip internal rotation    Hip external rotation    Knee flexion    Knee extension 4+; crepitus 5  Ankle dorsiflexion 5 5  Ankle plantarflexion    Ankle inversion    Ankle eversion    (Blank rows = not tested)  BED MOBILITY:  Findings: Sit to supine Complete Independence Supine to sit Complete Independence Rolling to Right Complete Independence Rolling to Left Complete Independence  TRANSFERS: Sit to stand: Complete Independence  Assistive device utilized: None     Stand to sit: Complete Independence  Assistive device utilized: None     Chair to chair: Complete Independence  Assistive device utilized: None       RAMP:  Not tested  CURB:  Not tested  STAIRS: Not tested GAIT: Findings: Gait Characteristics: step through pattern, decreased hip/knee flexion- Right, decreased hip/knee flexion- Left, and narrow BOS, Distance walked: various clinic distances, Assistive device utilized:None, Level of assistance: Complete Independence, and Comments: Mild right drift at very end of pathway.  FUNCTIONAL TESTS:  5 times sit to stand: 15.04 sec w/ BUE support 10 meter walk test: 7.50 sec SBA no AD = 1.33 m/sec OR 4.4 ft/sec Functional gait assessment:  TBA  Interpretation of scores: Non-Specific Older Adults Cutoff Score: <=22/30 = risk of falls Parkinson's Disease Cutoff score <15/30= fall risk (Hoehn & Yahr 1-4)  Minimally Clinically Important Difference (MCID)  Stroke (acute, subacute, and chronic) = MDC: 4.2 points Vestibular (acute) = MDC: 6 points Community Dwelling Older Adults =  MCID: 4 points  Parkinson's Disease  =  MDC: 4.3 points  (Academy of  Neurologic Physical Therapy (nd). Functional Gait Assessment. Retrieved from https://www.neuropt.org/docs/default-source/cpgs/core-outcome-measures/function-gait-assessment-pocket-guide-proof9-(2).pdf?sfvrsn=b42f35043_0.)  MCTSIB: TBA  PATIENT SURVEYS:  ABC scale: The Activities-Specific Balance Confidence (ABC) Scale 0% 10 20 30  40 50 60 70 80 90 100% No confidence<->completely confident  "How confident are you that you will not lose your balance or become unsteady when you . . .   Date tested 08/01/2023  Walk around the house 100%  2. Walk up or down stairs 50%  3. Bend over and pick up a slipper from in front of a closet floor 100%  4. Reach for a small can off a shelf at eye level 100%  5. Stand on tip toes and reach for something above your head 80%  6. Stand on a chair and reach for something 100%  7. Sweep the floor 100%  8. Walk outside the house to a car parked in the driveway 90%  9. Get into or out of a car 100%  10. Walk across a parking lot to the mall 100%  11. Walk up or down a ramp 50%  12. Walk in a crowded mall where people rapidly walk past you 100%  13. Are bumped into by people as you walk through the mall 90%  14. Step onto or off of an escalator while you are holding onto the railing 100%  15. Step onto or off an escalator while holding onto parcels such that you cannot hold onto the railing 100%  16. Walk outside on icy sidewalks 0%  Total: #/16 85%                                                                                                                                 TREATMENT DATE: 08/28/2023  -Treadmill training x8 minutes up to speed 1.9 mph on 0% incline for dynamic cardiovascular warmup.  Cues to improve posture. -STS x10 w/o UE support -Lateral step outs 2x10 each side using BUE support, task modified to accommodate knee flexion tolerance -Seated march w/ 3lb contralateral march 3x10 alternating sides, pt reports mild crepitus but no  pain -Standing semi tandem unsupported forward reach x12 each foot in rear -Stride stance w/ rear swing through to midline 8 cone taps -Standing supported lateral 8 cone taps x15 each side -Standing BUE support forward T x10 each side SBA  PATIENT EDUCATION: Education details: Continue HEP.  Bring cane to future session.  Alternating ice/heat for knee pain before/after PT. Person educated: Patient Education method: Medical illustrator Education comprehension: verbalized understanding  HOME EXERCISE PROGRAM: Access Code: [email or web address] URL: [email or web address] Date: 08/15/2023 Prepared by: Daved Bull  Exercises - Sit to Stand  - 1 x daily - 7 x weekly - 1-2 sets - 10 reps - Tandem Walking with Counter Support  - 1 x daily - 4 x weekly - 3 sets - 10 reps - Backward Walking with Counter  Support  - 1 x daily - 4 x weekly - 3 sets - 10 reps - Walking with Eyes Closed and Counter Support  - 1 x daily - 4 x weekly - 3 sets - 10 reps - Standing Near Stance in Corner with Eyes Closed  - 1 x daily - 4 x weekly - 1 sets - 3 reps - 30 seconds hold  GOALS: Goals reviewed with patient? Yes  SHORT TERM GOALS: Target date: 08/31/2023  Pt will be independent and compliant with introductory strength and balance HEP in order to maintain functional progress and improve mobility. Baseline:  Informally established on eval. Goal status: INITIAL  2.  Pt will improve FGA score to 24/30 in order to demonstrate improved balance and decreased fall risk. Baseline: 21/30 (8/13) Goal status: INITIAL  3.  mCTSIB to be assessed w/ STG set as appropriate. Baseline: LTG only Goal status: Revised - D/C'd (8/13)  LONG TERM GOALS: Target date: 09/28/2023  Pt will be independent and compliant with advanced and finalized strength and balance HEP in order to maintain functional progress and improve mobility. Baseline: Informally established on eval. Goal status: INITIAL  2.  Pt will  improve ABC Scale score to >/=95% in order to decrease fall risk by demonstrating decreased fear of falling. Baseline: 85% Goal status: INITIAL  3.  Pt will improve FGA score to 27/30 in order to demonstrate improved balance and decreased fall risk. Baseline: 21/30 (8/13) Goal status: INITIAL  4.  Pt to complete condition 4 of mCTSIB with average of 3 trials of 30 seconds in order to demonstrate improved vestibular function in relation to static balance. Baseline: 23.33 seconds avg (8/13) Goal status: INITIAL  5.  Pt will decrease 5xSTS to </=12 seconds w/o UE support in order to demonstrate decreased risk for falls and improved functional bilateral LE strength and power. Baseline: 15.04 sec w/ BUE support Goal status: INITIAL  ASSESSMENT:  CLINICAL IMPRESSION: Focus of skilled session variable today to address core and LE strength which was somewhat limited by deep knee flexion intolerance due to pain and crepitus.  She tolerates gentle reserved knee flexion ROM well.  Introduced treadmill warmup as pt wanting to get back to using her treadmill equipment at home.  Remainder of session focused on addressing stride and semi-tandem stability and weight shifting.  She continues to benefit from high level stability challenges and ongoing core and BLE strengthening to optimize functional independence.  Will continue per POC.  OBJECTIVE IMPAIRMENTS: Abnormal gait, decreased balance, decreased knowledge of use of DME, decreased strength, impaired vision/preception, improper body mechanics, and postural dysfunction.   ACTIVITY LIMITATIONS: carrying, lifting, standing, squatting, stairs, reach over head, and locomotion level  PARTICIPATION LIMITATIONS: meal prep, cleaning, community activity, and yard work  PERSONAL FACTORS: Age, Fitness, Past/current experiences, and 1-2 comorbidities: low vision, impaired hearing are also affecting patient's functional outcome.   REHAB POTENTIAL:  Excellent  CLINICAL DECISION MAKING: Evolving/moderate complexity  EVALUATION COMPLEXITY: Moderate  PLAN:  PT FREQUENCY: 1x/week  PT DURATION: 8 weeks  PLANNED INTERVENTIONS: [id number]- PT Re-evaluation, 97750- Physical Performance Testing, 97110-Therapeutic exercises, 97530- Therapeutic activity, [id number]- Neuromuscular re-education, 97535- Self Care, [id number]- Manual therapy, [phone]- Gait training, Patient/Family education, Balance training, Stair training, Vestibular training, and DME instructions  PLAN FOR NEXT SESSION: Hurrycane stair descent.  Core engagement per pt request.  Expand HEP for high level balance and BLE strength.  Work into full tandem stance.  Therastones.  Compliant surface ramp/decline tasks. Glut/quad strength.  SciFit/treadmill  endurance.  Some discomfort w/ STS/other knee flexion activities due to knee pain w/ crepitus  Daved KATHEE Bull, PT, DPT 08/28/2023, 10:26 AM

## 2023-08-28 NOTE — Therapy (Addendum)
 OUTPATIENT OCCUPATIONAL THERAPY LOW VISION TREATMENT  Patient Name: Brittney Stewart MRN: [id number] DOB:[birth year], 83 y.o., female Today's Date: 08/28/2023  PCP: Roanna Ezekiel NOVAK, MD REFERRING PROVIDER: Roanna Ezekiel NOVAK, MD  END OF SESSION:  OT End of Session - 08/28/23 1004     Visit Number 4    Number of Visits 10    Date for OT Re-Evaluation 10/05/23    Authorization Type Medicare A&B/BCBS Supp 2025 Covered 100% Follow Medicare Guidelines    Progress Note Due on Visit 10    OT Start Time 1020    OT Stop Time 1100    OT Time Calculation (min) 40 min    Activity Tolerance Patient tolerated treatment well    Behavior During Therapy WFL for tasks assessed/performed          Past Medical History:  Diagnosis Date   Allergic rhinitis, cause unspecified    Benign heart murmur    GERD (gastroesophageal reflux disease)    Hypertension    Inflamed sebaceous cyst    Insomnia, unspecified    Macular degeneration, wet (HCC)    Left eye   Urine incontinence    Vitamin D deficiency    Past Surgical History:  Procedure Laterality Date   APPENDECTOMY     BACK SURGERY     CERVICAL SPINE SURGERY     COLONOSCOPY  10/11   dexa scan  02/2010   DIAGNOSTIC MAMMOGRAM  04/2010   PELVIC FRACTURE SURGERY  1968   SHOULDER SURGERY     complete right, partial left   TUMOR REMOVAL     Patient Active Problem List   Diagnosis Date Noted   Primary osteoarthritis of both hands 09/28/2021   DDD (degenerative disc disease), cervical 09/21/2017   DDD (degenerative disc disease), lumbar 09/21/2017   Inflamed sebaceous cyst 12/04/2011   Hypertension 12/04/2011   GERD (gastroesophageal reflux disease) 12/04/2011   Allergic rhinitis 12/04/2011   Hyperlipidemia 12/04/2011    ONSET DATE: 07/11/2023  REFERRING DIAG: R53.81 (ICD-10-CM) - Other malaise  THERAPY DIAG:  Visuospatial deficit  Other symptoms and signs involving the musculoskeletal system  Pain in joint of left  hand  Rationale for Evaluation and Treatment: Rehabilitation  SUBJECTIVE:   SUBJECTIVE STATEMENT:  Pt is waiting to get her 2 new pairs of glasses - one for music and one with progressive lenses for driving and she reports they will have her at 20/20 corrected vision verus 20/40 currently.    Pt also brought a picture of the tactile markers she used to label her microwave as noted below.  Pt noted the blue was for defrost, red for stop and green for go as well as yellow for the power level button.       Pt reported that red writing on products is not conducive for reading instructions.  Pt accompanied by: self  PERTINENT HISTORY:   Per MD report 2024: Stable age related macular degeneration. Need to use vitamins and monitor with [employer] grid. Follow with Dr Renell. Have not seen most recent notes but pt reports injections now OU. Sounds like antiVEGF OD and newer drug for dry OS.  Seen previously for OT in 2024 - She had been taking recorder lessons which was causing discomfort in her bilateral CMC joints. She had stiffness in her hands. She had been using a hand massager which helps to some extent. She continued to have some discomfort in her left shoulder joint which was replaced. She had difficulty sleeping on the left  side.   Dupuytren's contracture of right hand-asymptomatic. H/O total shoulder replacement, right. H/O of left shoulder hemiarthroplasty. Rheumatoid factor positive   PRECAUTIONS: Fall  WEIGHT BEARING RESTRICTIONS: No  PAIN:  Are you having pain? No  FALLS: Has patient fallen in last 6 months? Yes. Number of falls 1 tripped over dog - 110 lb [place] - 2 weeks ago and landed on tile floor and managed to get up but she twisted funny and had pain on her side but that seems to be ok - minor twinges.   LIVING ENVIRONMENT: Lives with: lives with their spouse Lives in: House Stairs: Yes: External: 4 steps; bilateral but cannot reach both - Indoors equipped  with elevator Has following equipment at home: Wheelchair (manual) and perhaps walker that belonged to her mother  PLOF: Independent - Pt reports 3000-4000 steps/day, active in music community, including several recorder ensembles.  Pt was headed to a [employer] after OT/PT evaluations.  PATIENT GOALS: Not sure - but knows she could use some recommendations for her vision ie) needs brighter light to read, being able to make fonts bigger etc.  Trouble reading directions esp red and in the dark, buttons on stoves are hard to read.  OBJECTIVE:  Note: Objective measures were completed at Evaluation unless otherwise noted.  GLASSES: bifocals  CURRENT MODIFICATIONS/ADAPTIONS: backlit Ipad for reading music  DEVICES: Ipad, light with magnifier, flashlight hanging on keychain  PHONE: Iphone - reported awareness of magnifier  HAND DOMINANCE: Right  ADLs: Overall ADLs: Pt rerports she is independent  Transfers/ambulation related to ADLs: Ind but will push things around for support Eating: Ind Grooming: Ind UB Dressing: Ind LB Dressing: Ind Toileting: Ind Bathing: Ind Tub Shower transfers: Walk in shower, has seat built in and grab bars - vertical and horizontal. Equipment: Shower seat with back, Grab bars, and Walk in shower  IADLs: Pt is still driving in the daytime - does not drie at night or in the rain but notes she can get assistance of a driver as needed. 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  MOBILITY STATUS: Independent and Hx of falls - no AE at this time  POSTURE COMMENTS:  rounded shoulders and forward head Sitting balance: WFL  ACTIVITY TOLERANCE: Activity tolerance:  Good  FUNCTIONAL OUTCOME MEASURES: Revised Self-Reported Assessment of Functional Visual Performance - 87% (13% impairment)  UPPER EXTREMITY ROM:   End range shoulder ROM due to history of shoulder surgeries  UPPER EXTREMITY MMT:     UE strength - WFL  HAND FUNCTION: Grip strength: Right: 47.8, 55.1,  lbs; Left: 47.1, 50.2 lbs Average: Right: 51.5 lbs; Left: 48.7 lbs  COORDINATION: 9 Hole Peg test: Right: 26.15 sec; Left: 25.29 sec  SENSATION: Aware of perhaps some numbness more in the morning  EDEMA: NA  MUSCLE TONE: WFL  COGNITION: Overall cognitive status: Within functional limits for tasks assessed  VISION: Subjective report: Pt reports injections every 2 months alternating between tx for wet and dry macular degeneration Baseline vision: Bifocals and Wears glasses all the time Visual history: macular degeneration  VISION ASSESSMENT: To be further assessed in functional context Tracking appeared [facility] with slight variations  Left eye - has some trouble with  seeing lower half of person's face  Pt admits she would give up driving if she thought she was unsafe as she reports being able to have a driver, which she does use if she is going to [facility] for appts.  Patient has difficulty with following activities due to following visual impairments:  -really relies on light to help with vision -finding dropped medication (depending on background) -pouring coffee into cups and knowing when it's full -pt made herself a bigger chart for financial record keeping -not reading as much as before due to difficulties -sometimes managing curbs -reading street signs on occasion due to lighting -more difficulty in grocery store seeing smaller items that are a distance away from her  OBSERVATIONS: Pt ambulates with no AE and no frank loss of balance but some hand support sought on occasion. The pt is well kept and has glasses donned.                                                                                                                             TODAY'S TREATMENT:    - Self care education and pt training initiated for duration as noted below including:  OTR continued with education and review re: general vision strategies to improve independence and safety with ADL and IADL completion secondary to visual impairment and joint protection.  Reviewed options r/t goals such as:  2 compensatory strategies for visual impairment  - Pt placed tactile buttons in place on microwave and stove - Pt has a flashlight on her key chain for menus etc  Adapted strategies and/or equipment PRN to increase safety and independence with ADLs and IADLs for max joint protection - Pt careful with picking up heavy objects with 2 hands - Pt wears wrist gloves/splints as needed to protect her hands  Using visual scanning, magnification, or app-based assistance - Pt assisted to locate magnifier app on her phone and place it on her home screen in alphabetical order based on other organized apps per pt and another person helped her do  Will demonstrate improved functional mobility and safety with environmental navigation - Re: driving - OT provided pt information re: driving evaluation options she may consider for bioptic visual assistance with brochure provided to pt.   PATIENT EDUCATION: Education details: Low Vision recommendations Person educated: Patient Education method: Programmer, multimedia, Demonstration, Verbal cues, and Handouts Education comprehension: verbalized understanding, returned demonstration, verbal cues required, and needs further education  HOME EXERCISE PROGRAM: 08/01/23: initiated education re: vision strategies and joint protection. 08/15/23: Iphone modification options 08/20/23: Low vision recommendation handouts  GOALS: Goals reviewed with patient? Yes  SHORT TERM GOALS: Target date: 09/07/23  Patient will independently recall at least 2 compensatory strategies for  visual impairment without cueing. Baseline: New to outpt OT  Goal status: MET 08/28/23: Buttons in place on microwave and above the buttons on stove, flashlight on her key chain for menus  2.  Patient will return demonstration of visual  adaptation use and verbalize understanding of how to obtain item(s) if desired.  Baseline: New to outpt OT  Goal status: MET  3.  Patient will utilize adaptive visual strategies and/or devices to independently and safely complete daily household tasks (e.g., medication management, pouring liquids). Baseline: New to outpt OT  Goal status: IN Progress 08/28/23: Pt reports using white cup for coffee to help with awareness of liquids and aware of aides from [employer] for liquid level detection; pt sorts medication in pill box   4.  Pt will verbalize understanding of adapted strategies and/or equipment PRN to increase safety and independence with ADLs and IADLs for max joint protection. Baseline: history of B hand arthritis Goal status: MET 08/28/23: Careful with picking up objects with 2 hands, wears wrist gloves/splint,    LONG TERM GOALS: Target date: 10/05/23  Patient will demonstrate improved functional mobility and safety with environmental navigation (e.g., curbs, store aisles) using visual compensatory strategies in 90% of observed tasks. Baseline: New to outpt OT  Goal status: IN Progress  2.  Patient will use visual scanning, magnification, or app-based assistance to identify items on grocery shelves at mid- to far-distance in 3/5 observed tasks. Baseline: New to outpt OT  Goal status: IN Progress 08/28/23: Worked on [employer] on computer  3.  OT to assess and set appropriate PSFS goal.  Baseline: Pt identified areas such as buttons on appliances and need for new lamp as areas to address but did not rate impairments in these areas yet Goal status: INITIAL   ASSESSMENT:  CLINICAL IMPRESSION: Patient is a 83 y.o. female who was seen today for  occupational therapy treatment for of vision related impairments due to macular degeneration.  Pt appreciative of recommendations provided today for driving assessment and .  Pt will benefit from further skilled OT services in the outpatient setting for education on visual impairment(s), compensatory and safety strategies, and use of AD as needed for more independent and safe completion of daily occupations; as well as to address and deficits r/t arthritis etc.  PERFORMANCE DEFICITS: in functional skills including ADLs, IADLs, coordination, sensation, ROM, strength, flexibility, Fine motor control, Gross motor control, hearing, mobility, balance, decreased knowledge of precautions, decreased knowledge of use of DME, vision, and UE functional use, cognitive skills including safety awareness, and psychosocial skills including coping strategies, environmental adaptation, habits, and routines and behaviors.   IMPAIRMENTS: are limiting patient from ADLs, IADLs, rest and sleep, work, and leisure.   CO-MORBIDITIES: has co-morbidities such as arthritis and macular degeneration that affects occupational performance. Patient will benefit from skilled OT to address above impairments and improve overall function.  REHAB POTENTIAL: Good  PLAN:  OT FREQUENCY: 1x/week  OT DURATION: 8 weeks - POC extended due to scheduling  PLANNED INTERVENTIONS: 97168 OT Re-evaluation, 97535 self care/ADL training, [id number] therapeutic exercise, 97530 therapeutic activity, 97763 Orthotic/Prosthetic subsequent, functional mobility training, visual/perceptual remediation/compensation, coping strategies training, patient/family education, and DME and/or AE instructions  RECOMMENDED OTHER SERVICES: NA - PT evaluation occurred on the same day as OT eval  CONSULTED AND AGREED WITH PLAN OF CARE: Patient  PLAN FOR NEXT SESSION:  Practice medication management AE options Car modifications for vision ie) signage etc  Apps for  monitoring surroundings ie) [employer] App  DC visit?    Clarita LITTIE Pride, OT 08/28/2023, 6:27 PM

## 2023-09-04 ENCOUNTER — Encounter: Payer: Self-pay | Admitting: Physical Therapy

## 2023-09-04 ENCOUNTER — Ambulatory Visit: Attending: Internal Medicine | Admitting: Physical Therapy

## 2023-09-04 ENCOUNTER — Ambulatory Visit: Admitting: Occupational Therapy

## 2023-09-04 DIAGNOSIS — H3530 Unspecified macular degeneration: Secondary | ICD-10-CM | POA: Insufficient documentation

## 2023-09-04 DIAGNOSIS — R2689 Other abnormalities of gait and mobility: Secondary | ICD-10-CM | POA: Diagnosis present

## 2023-09-04 DIAGNOSIS — R2681 Unsteadiness on feet: Secondary | ICD-10-CM | POA: Diagnosis present

## 2023-09-04 DIAGNOSIS — Z9181 History of falling: Secondary | ICD-10-CM | POA: Diagnosis present

## 2023-09-04 DIAGNOSIS — R29898 Other symptoms and signs involving the musculoskeletal system: Secondary | ICD-10-CM | POA: Diagnosis present

## 2023-09-04 DIAGNOSIS — R41842 Visuospatial deficit: Secondary | ICD-10-CM | POA: Insufficient documentation

## 2023-09-04 DIAGNOSIS — R278 Other lack of coordination: Secondary | ICD-10-CM | POA: Diagnosis present

## 2023-09-04 DIAGNOSIS — M6281 Muscle weakness (generalized): Secondary | ICD-10-CM | POA: Insufficient documentation

## 2023-09-04 NOTE — Therapy (Signed)
 OUTPATIENT PHYSICAL THERAPY NEURO TREATMENT   Patient Name: Cloe Sockwell MRN: [id number] DOB:1940/05/08, 83 y.o., female Today's Date: 09/04/2023   PCP: Roanna Ezekiel NOVAK, MD REFERRING PROVIDER: Roanna Ezekiel NOVAK, MD  END OF SESSION:  PT End of Session - 09/04/23 1015     Visit Number 5    Number of Visits 9   8 + eval   Date for PT Re-Evaluation 10/12/23   Pushed out due to potential multi-disciplinary scheduling delay   Authorization Type Medicare A&B, Secondary BCBS    Progress Note Due on Visit 10    PT Start Time 1015    PT Stop Time 1057    PT Time Calculation (min) 42 min    Equipment Utilized During Treatment Gait belt    Activity Tolerance Patient tolerated treatment well    Behavior During Therapy WFL for tasks assessed/performed          Past Medical History:  Diagnosis Date   Allergic rhinitis, cause unspecified    Benign heart murmur    GERD (gastroesophageal reflux disease)    Hypertension    Inflamed sebaceous cyst    Insomnia, unspecified    Macular degeneration, wet (HCC)    Left eye   Urine incontinence    Vitamin D deficiency    Past Surgical History:  Procedure Laterality Date   APPENDECTOMY     BACK SURGERY     CERVICAL SPINE SURGERY     COLONOSCOPY  10/11   dexa scan  02/2010   DIAGNOSTIC MAMMOGRAM  04/2010   PELVIC FRACTURE SURGERY  1968   SHOULDER SURGERY     complete right, partial left   TUMOR REMOVAL     Patient Active Problem List   Diagnosis Date Noted   Primary osteoarthritis of both hands 09/28/2021   DDD (degenerative disc disease), cervical 09/21/2017   DDD (degenerative disc disease), lumbar 09/21/2017   Inflamed sebaceous cyst 12/04/2011   Hypertension 12/04/2011   GERD (gastroesophageal reflux disease) 12/04/2011   Allergic rhinitis 12/04/2011   Hyperlipidemia 12/04/2011    ONSET DATE: 2 weeks ago  REFERRING DIAG: R53.81 (ICD-10-CM) - Other malaise  THERAPY DIAG:  Other lack of coordination  History of  falling  Muscle weakness (generalized)  Other abnormalities of gait and mobility  Unsteadiness on feet  Rationale for Evaluation and Treatment: Rehabilitation  SUBJECTIVE:                                                                                                                                                                                             SUBJECTIVE STATEMENT: Pt denies pain or recent falls.  She reports marked stiffness this morning, but unsure why - thinks it is arthritis.  She brought her cane today and reports this is helping her intermittently.  She is not using cane full-time. Pt accompanied by: self (drives herself)  PERTINENT HISTORY: HTN, GERD, low vision, HLD, cervical and lumbar DDD, OA of both hands, bilateral hearing aides  PAIN:  Are you having pain? Yes: NPRS scale: 4 Pain location: left posterior hip/lower flank Pain description: a twist Aggravating factors: standing too long Relieving factors: biofreeze  PRECAUTIONS: Fall  RED FLAGS: None   WEIGHT BEARING RESTRICTIONS: No  FALLS: Has patient fallen in last 6 months? Yes. Number of falls 1 - tripped over german shepherd  LIVING ENVIRONMENT: Lives with: lives with their spouse Lives in: House/apartment Stairs: Yes: Internal: 3 flights steps; bilateral railing; she uses elevator in home to get from basement to 1st/2nd floor and External: 4 steps; bilateral but cannot reach both Has following equipment at home: Single point cane, Wheelchair (manual), Shower bench, Grab bars, and walking stick  PLOF: Independent  PATIENT GOALS: to not be afraid of falling  OBJECTIVE:  Note: Objective measures were completed at Evaluation unless otherwise noted.  DIAGNOSTIC FINDINGS: No recent or relevant imaging.  COGNITION: Overall cognitive status: Within functional limits for tasks assessed   SENSATION: Light touch: WFL  COORDINATION: LE RAMS:  WNL Bilateral Heel-to-Shin:  WNL  EDEMA:   None significant in BLE  MUSCLE TONE: None noted in BLE  POSTURE: forward head (mild)  LOWER EXTREMITY ROM:     Active  Right Eval Left Eval  Hip flexion Grossly WNL  Hip extension   Hip abduction   Hip adduction   Hip internal rotation   Hip external rotation   Knee flexion   Knee extension   Ankle dorsiflexion   Ankle plantarflexion   Ankle inversion    Ankle eversion     (Blank rows = not tested)  LOWER EXTREMITY MMT:    MMT Right Eval Left Eval  Hip flexion 4 3+  Hip extension    Hip abduction    Hip adduction    Hip internal rotation    Hip external rotation    Knee flexion    Knee extension 4+; crepitus 5  Ankle dorsiflexion 5 5  Ankle plantarflexion    Ankle inversion    Ankle eversion    (Blank rows = not tested)  BED MOBILITY:  Findings: Sit to supine Complete Independence Supine to sit Complete Independence Rolling to Right Complete Independence Rolling to Left Complete Independence  TRANSFERS: Sit to stand: Complete Independence  Assistive device utilized: None     Stand to sit: Complete Independence  Assistive device utilized: None     Chair to chair: Complete Independence  Assistive device utilized: None       RAMP:  Not tested  CURB:  Not tested  STAIRS: Not tested GAIT: Findings: Gait Characteristics: step through pattern, decreased hip/knee flexion- Right, decreased hip/knee flexion- Left, and narrow BOS, Distance walked: various clinic distances, Assistive device utilized:None, Level of assistance: Complete Independence, and Comments: Mild right drift at very end of pathway.  FUNCTIONAL TESTS:  5 times sit to stand: 15.04 sec w/ BUE support 10 meter walk test: 7.50 sec SBA no AD = 1.33 m/sec OR 4.4 ft/sec Functional gait assessment:  TBA  Interpretation of scores: Non-Specific Older Adults Cutoff Score: <=22/30 = risk of falls Parkinson's Disease Cutoff score <15/30= fall risk (Hoehn & Yahr 1-4)  Minimally Clinically  Important Difference (MCID)  Stroke (acute, subacute, and chronic) = MDC: 4.2 points Vestibular (acute) = MDC: 6 points Community Dwelling Older Adults =  MCID: 4 points Parkinson's Disease  =  MDC: 4.3 points  (Academy of Neurologic Physical Therapy (nd). Functional Gait Assessment. Retrieved from https://www.neuropt.org/docs/default-source/cpgs/core-outcome-measures/function-gait-assessment-pocket-guide-proof9-(2).pdf?sfvrsn=b36f35043_0.)  MCTSIB: TBA  PATIENT SURVEYS:  ABC scale: The Activities-Specific Balance Confidence (ABC) Scale 0% 10 20 30  40 50 60 70 80 90 100% No confidence<->completely confident  "How confident are you that you will not lose your balance or become unsteady when you . . .   Date tested 08/01/2023  Walk around the house 100%  2. Walk up or down stairs 50%  3. Bend over and pick up a slipper from in front of a closet floor 100%  4. Reach for a small can off a shelf at eye level 100%  5. Stand on tip toes and reach for something above your head 80%  6. Stand on a chair and reach for something 100%  7. Sweep the floor 100%  8. Walk outside the house to a car parked in the driveway 90%  9. Get into or out of a car 100%  10. Walk across a parking lot to the mall 100%  11. Walk up or down a ramp 50%  12. Walk in a crowded mall where people rapidly walk past you 100%  13. Are bumped into by people as you walk through the mall 90%  14. Step onto or off of an escalator while you are holding onto the railing 100%  15. Step onto or off an escalator while holding onto parcels such that you cannot hold onto the railing 100%  16. Walk outside on icy sidewalks 0%  Total: #/16 85%                                                                                                                                 TREATMENT DATE: 09/04/2023  -SciFit using BUE/BLE in multi-peaks mode up to level 4.0 for reciprocal mobility, neural priming, and global strengthening. -Verbally  reviewed HEP - reprinted and emailed copy to patient -Side-bending stretch 2x45 sec each side - pt reports pain relief w/ R sided stretch -Seated rotation stretch 2x45 sec each side -FGA:  [facility] PT Assessment - 09/04/23 1043       Functional Gait  Assessment   Gait assessed  Yes    Gait Level Surface Walks 20 ft in less than 5.5 sec, no assistive devices, good speed, no evidence for imbalance, normal gait pattern, deviates no more than 6 in outside of the 12 in walkway width.    Change in Gait Speed Able to smoothly change walking speed without loss of balance or gait deviation. Deviate no more than 6 in outside of the 12 in walkway width.    Gait with Horizontal Head Turns Performs head turns smoothly with slight change in gait velocity (eg, minor  disruption to smooth gait path), deviates 6-10 in outside 12 in walkway width, or uses an assistive device.    Gait with Vertical Head Turns Performs task with slight change in gait velocity (eg, minor disruption to smooth gait path), deviates 6 - 10 in outside 12 in walkway width or uses assistive device    Gait and Pivot Turn Pivot turns safely within 3 sec and stops quickly with no loss of balance.    Step Over Obstacle Is able to step over one shoe box (4.5 in total height) without changing gait speed. No evidence of imbalance.    Gait with Narrow Base of Support Ambulates less than 4 steps heel to toe or cannot perform without assistance.    Gait with Eyes Closed Walks 20 ft, slow speed, abnormal gait pattern, evidence for imbalance, deviates 10-15 in outside 12 in walkway width. Requires more than 9 sec to ambulate 20 ft.    Ambulating Backwards Walks 20 ft, uses assistive device, slower speed, mild gait deviations, deviates 6-10 in outside 12 in walkway width.    Steps Alternating feet, must use rail.    Total Score 20    FGA comment: 20/30 = moderate fall risk           PATIENT EDUCATION: Education details: Continue HEP w/ additions  today.  Progress towards goals. Person educated: Patient Education method: Medical illustrator Education comprehension: verbalized understanding  HOME EXERCISE PROGRAM: Access Code: [email or web address] URL: [email or web address] Date: 08/15/2023 Prepared by: Daved Bull  Exercises - Sit to Stand  - 1 x daily - 7 x weekly - 1-2 sets - 10 reps - Tandem Walking with Counter Support  - 1 x daily - 4 x weekly - 3 sets - 10 reps - Backward Walking with Counter Support  - 1 x daily - 4 x weekly - 3 sets - 10 reps - Walking with Eyes Closed and Counter Support  - 1 x daily - 4 x weekly - 3 sets - 10 reps - Standing Near Stance in Corner with Eyes Closed  - 1 x daily - 4 x weekly - 1 sets - 3 reps - 30 seconds hold - TL Sidebending Stretch - Single Arm Overhead  - 1 x daily - 7 x weekly - 3 sets - 10 reps - Seated Trunk Rotation Stretch  - 1 x daily - 7 x weekly - 3 sets - 10 reps  GOALS: Goals reviewed with patient? Yes  SHORT TERM GOALS: Target date: 08/31/2023  Pt will be independent and compliant with introductory strength and balance HEP in order to maintain functional progress and improve mobility. Baseline:  IND and compliant (9/2) Goal status: MET  2.  Pt will improve FGA score to 24/30 in order to demonstrate improved balance and decreased fall risk. Baseline: 21/30 (8/13); 20/30 (9/2) Goal status: NOT MET  3.  mCTSIB to be assessed w/ STG set as appropriate. Baseline: LTG only Goal status: Revised - D/C'd (8/13)  LONG TERM GOALS: Target date: 09/28/2023  Pt will be independent and compliant with advanced and finalized strength and balance HEP in order to maintain functional progress and improve mobility. Baseline: Informally established on eval. Goal status: INITIAL  2.  Pt will improve ABC Scale score to >/=95% in order to decrease fall risk by demonstrating decreased fear of falling. Baseline: 85% Goal status: INITIAL  3.  Pt will improve FGA score  to 24/30 in order to demonstrate improved balance and decreased fall risk. Baseline:  21/30 (8/13) Goal status: REVISED  4.  Pt to complete condition 4 of mCTSIB with average of 3 trials of 30 seconds in order to demonstrate improved vestibular function in relation to static balance. Baseline: 23.33 seconds avg (8/13) Goal status: INITIAL  5.  Pt will decrease 5xSTS to </=12 seconds w/o UE support in order to demonstrate decreased risk for falls and improved functional bilateral LE strength and power. Baseline: 15.04 sec w/ BUE support Goal status: INITIAL  ASSESSMENT:  CLINICAL IMPRESSION: Addressed left hip and flank pain with gentle stretching today with positive response.  She was encouraged to stretch more with additions to HEP.  Her FGA score remains essentially unchanged today indicating moderate fall risk.  PT to continue working on dynamic stability and compensations with cane as needed to improve fall risk.  Will continue per POC.  OBJECTIVE IMPAIRMENTS: Abnormal gait, decreased balance, decreased knowledge of use of DME, decreased strength, impaired vision/preception, improper body mechanics, and postural dysfunction.   ACTIVITY LIMITATIONS: carrying, lifting, standing, squatting, stairs, reach over head, and locomotion level  PARTICIPATION LIMITATIONS: meal prep, cleaning, community activity, and yard work  PERSONAL FACTORS: Age, Fitness, Past/current experiences, and 1-2 comorbidities: low vision, impaired hearing are also affecting patient's functional outcome.   REHAB POTENTIAL: Excellent  CLINICAL DECISION MAKING: Evolving/moderate complexity  EVALUATION COMPLEXITY: Moderate  PLAN:  PT FREQUENCY: 1x/week  PT DURATION: 8 weeks  PLANNED INTERVENTIONS: [id number]- PT Re-evaluation, 97750- Physical Performance Testing, 97110-Therapeutic exercises, 97530- Therapeutic activity, [id number]- Neuromuscular re-education, 97535- Self Care, [id number]- Manual therapy, [phone]- Gait training,  Patient/Family education, Balance training, Stair training, Vestibular training, and DME instructions  PLAN FOR NEXT SESSION: Hurrycane stair descent.  Core engagement per pt request.  Expand HEP for high level balance and BLE strength.  Work into full tandem stance.  Therastones.  Compliant surface ramp/decline tasks. Glut/quad strength.  SciFit/treadmill endurance.  Some discomfort w/ STS/other knee flexion activities due to knee pain w/ crepitus  Daved KATHEE Bull, PT, DPT 09/04/2023, 10:58 AM

## 2023-09-04 NOTE — Patient Instructions (Signed)
-   TL Sidebending Stretch - Single Arm Overhead  - 1 x daily - 7 x weekly - 3 sets - 10 reps - Seated Trunk Rotation Stretch  - 1 x daily - 7 x weekly - 3 sets - 10 reps

## 2023-09-04 NOTE — Therapy (Signed)
 OUTPATIENT OCCUPATIONAL THERAPY LOW VISION TREATMENT & DISCHARGE SUMMARY  Patient Name: Brittney Stewart MRN: [id number] DOB:12-02-1940, 83 y.o., female Today's Date: 09/04/2023  PCP: Roanna Ezekiel NOVAK, MD REFERRING PROVIDER: Roanna Ezekiel NOVAK, MD  END OF SESSION:  OT End of Session - 09/04/23 9062     Visit Number 5    Number of Visits 10    Date for OT Re-Evaluation 10/05/23    Authorization Type Medicare A&B/BCBS Supp 2025 Covered 100% Follow Medicare Guidelines    Progress Note Due on Visit 10    OT Start Time [phone]    OT Stop Time 1015    OT Time Calculation (min) 38 min    Activity Tolerance Patient tolerated treatment well    Behavior During Therapy WFL for tasks assessed/performed          Past Medical History:  Diagnosis Date   Allergic rhinitis, cause unspecified    Benign heart murmur    GERD (gastroesophageal reflux disease)    Hypertension    Inflamed sebaceous cyst    Insomnia, unspecified    Macular degeneration, wet (HCC)    Left eye   Urine incontinence    Vitamin D deficiency    Past Surgical History:  Procedure Laterality Date   APPENDECTOMY     BACK SURGERY     CERVICAL SPINE SURGERY     COLONOSCOPY  10/11   dexa scan  02/2010   DIAGNOSTIC MAMMOGRAM  04/2010   PELVIC FRACTURE SURGERY  1968   SHOULDER SURGERY     complete right, partial left   TUMOR REMOVAL     Patient Active Problem List   Diagnosis Date Noted   Primary osteoarthritis of both hands 09/28/2021   DDD (degenerative disc disease), cervical 09/21/2017   DDD (degenerative disc disease), lumbar 09/21/2017   Inflamed sebaceous cyst 12/04/2011   Hypertension 12/04/2011   GERD (gastroesophageal reflux disease) 12/04/2011   Allergic rhinitis 12/04/2011   Hyperlipidemia 12/04/2011    ONSET DATE: 07/11/2023  REFERRING DIAG: R53.81 (ICD-10-CM) - Other malaise  THERAPY DIAG:  Visuospatial deficit  Macular degeneration of both eyes, unspecified type  Rationale for Evaluation  and Treatment: Rehabilitation  SUBJECTIVE:   SUBJECTIVE STATEMENT:  Pt had a little vertigo over the weekend and this sometimes happen if she gets water in her ear in the shower.    Pt reports difficulty with the CAPTCHA pictures required for verification of identity as a person vs robot.  Pt is still waiting to get her 2 new pairs of glasses - one for music and one with progressive lenses for driving and she reports they will have her at 20/20 corrected vision verus 20/40 currently.    Pt accompanied by: self  PERTINENT HISTORY:   Per MD report 2024: Stable age related macular degeneration. Need to use vitamins and monitor with Amsler grid. Follow with Dr Renell. Have not seen most recent notes but pt reports injections now OU. Sounds like antiVEGF OD and newer drug for dry OS.  Seen previously for OT in 2024 - She had been taking recorder lessons which was causing discomfort in her bilateral CMC joints. She had stiffness in her hands. She had been using a hand massager which helps to some extent. She continued to have some discomfort in her left shoulder joint which was replaced. She had difficulty sleeping on the left side.   Dupuytren's contracture of right hand-asymptomatic. H/O total shoulder replacement, right. H/O of left shoulder hemiarthroplasty. Rheumatoid factor positive  PRECAUTIONS: Fall  WEIGHT BEARING RESTRICTIONS: No  PAIN:  Are you having pain? No  FALLS: Has patient fallen in last 6 months? Yes. Number of falls 1 tripped over dog - 110 lb [place] - 2 weeks ago and landed on tile floor and managed to get up but she twisted funny and had pain on her side but that seems to be ok - minor twinges.   LIVING ENVIRONMENT: Lives with: lives with their spouse Lives in: House Stairs: Yes: External: 4 steps; bilateral but cannot reach both - Indoors equipped with elevator Has following equipment at home: Wheelchair (manual) and perhaps walker that belonged to  her mother  PLOF: Independent - Pt reports 3000-4000 steps/day, active in music community, including several recorder ensembles.  Pt was headed to a [employer] after OT/PT evaluations.  PATIENT GOALS: Not sure - but knows she could use some recommendations for her vision ie) needs brighter light to read, being able to make fonts bigger etc.  Trouble reading directions esp red and in the dark, buttons on stoves are hard to read.  OBJECTIVE:  Note: Objective measures were completed at Evaluation unless otherwise noted.  GLASSES: bifocals  CURRENT MODIFICATIONS/ADAPTIONS: backlit Ipad for reading music  DEVICES: Ipad, light with magnifier, flashlight hanging on keychain  PHONE: Iphone - reported awareness of magnifier  HAND DOMINANCE: Right  ADLs: Overall ADLs: Pt rerports she is independent  Transfers/ambulation related to ADLs: Ind but will push things around for support Eating: Ind Grooming: Ind UB Dressing: Ind LB Dressing: Ind Toileting: Ind Bathing: Ind Tub Shower transfers: Walk in shower, has seat built in and grab bars - vertical and horizontal. Equipment: Shower seat with back, Grab bars, and Walk in shower  IADLs: Pt is still driving in the daytime - does not drie at night or in the rain but notes she can get assistance of a driver as needed. 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  MOBILITY STATUS: Independent and Hx of falls - no AE at this time  POSTURE COMMENTS:  rounded shoulders and forward head Sitting balance: WFL  ACTIVITY TOLERANCE: Activity tolerance: Good  FUNCTIONAL OUTCOME MEASURES: Revised Self-Reported Assessment of Functional Visual Performance - 87%  (13% impairment)  UPPER EXTREMITY ROM:   End range shoulder ROM due to history of shoulder surgeries  UPPER EXTREMITY MMT:     UE strength - WFL  HAND FUNCTION: Grip strength: Right: 47.8, 55.1,  lbs; Left: 47.1, 50.2 lbs Average: Right: 51.5 lbs; Left: 48.7 lbs  COORDINATION: 9 Hole Peg test: Right: 26.15 sec; Left: 25.29 sec  SENSATION: Aware of perhaps some numbness more in the morning  EDEMA: NA  MUSCLE TONE: WFL  COGNITION: Overall cognitive status: Within functional limits for tasks assessed  VISION: Subjective report: Pt reports injections every 2 months alternating between tx for wet and dry macular degeneration Baseline vision: Bifocals and Wears glasses all the time Visual history: macular degeneration  VISION ASSESSMENT: To be further assessed in functional context Tracking appeared [facility] with slight variations  Left eye - has some trouble with seeing lower half of person's face  Pt admits she would give up driving if she thought she was unsafe as she reports  being able to have a driver, which she does use if she is going to [facility] for appts.  Patient has difficulty with following activities due to following visual impairments:  -really relies on light to help with vision -finding dropped medication (depending on background) -pouring coffee into cups and knowing when it's full -pt made herself a bigger chart for financial record keeping -not reading as much as before due to difficulties -sometimes managing curbs -reading street signs on occasion due to lighting -more difficulty in grocery store seeing smaller items that are a distance away from her  OBSERVATIONS: Pt ambulates with no AE and no frank loss of balance but some hand support sought on occasion. The pt is well kept and has glasses donned.                                                                                                                            TODAY'S TREATMENT:    - Self  care education and pt training initiated for duration as noted below including:  OTR completed further discharge instructions and review re: general vision strategies to improve independence and safety with ADL and IADL completion secondary to visual impairment.  Reviewed options r/t goals such as:  Compensatory strategies for visual impairment  - Pt reports probable need for nail polish to help with placement and identification of tactile buttons and marker for microwave and stove - Pt shown how to use the auditory option for CAPTCHA by pressing the earphone tab at the bottom of these verifications - Pt aware of using her magnifier app on her phone located it easily on her home screen from alphabetical order organization of her apps  - OT provided pt information re: Accessibility options on her computer r/t magnifier, zoom level/increments etc   PATIENT EDUCATION: Education details: Low Vision recommendations Person educated: Patient Education method: Explanation, Demonstration, Verbal cues, and Handouts/Email Education comprehension: verbalized understanding, returned demonstration, verbal cues required, and needs further education  HOME EXERCISE PROGRAM: 08/01/23: initiated education re: vision strategies and joint protection. 08/15/23: Iphone modification options 08/20/23: Low vision recommendation handouts  GOALS: Goals reviewed with patient? Yes  SHORT TERM GOALS: Target date: 09/07/23  Patient will independently recall at least 2 compensatory strategies for visual impairment without cueing. Baseline: New to outpt OT  Goal status: MET 08/28/23: Buttons in place on microwave and above the buttons on stove, flashlight on her key chain for menus  2.  Patient will return demonstration of visual adaptation use and verbalize understanding of how to obtain item(s) if desired.  Baseline: New to outpt OT  Goal status: MET  3.  Patient will utilize adaptive visual strategies and/or devices  to independently and safely complete daily household tasks (e.g., medication management, pouring liquids). Baseline: New to outpt OT  Goal status: MET 08/28/23: Pt reports using white cup for coffee to help with awareness of liquids and aware of aides from [employer] for liquid level detection;  pt sorts medication in pill box   4.  Pt will verbalize understanding of adapted strategies and/or equipment PRN to increase safety and independence with ADLs and IADLs for max joint protection. Baseline: history of B hand arthritis Goal status: MET 08/28/23: Careful with picking up objects with 2 hands, wears wrist gloves/splint,    LONG TERM GOALS: Target date: 10/05/23  Patient will demonstrate improved functional mobility and safety with environmental navigation (e.g., curbs, store aisles) using visual compensatory strategies in 90% of observed tasks. Baseline: New to outpt OT  Goal status: MET  2.  Patient will use visual scanning, magnification, or app-based assistance to identify items on grocery shelves at mid- to far-distance in 3/5 observed tasks. Baseline: New to outpt OT  Goal status: MET 09/04/23: Worked on Water engineer App on computer   ASSESSMENT:  CLINICAL IMPRESSION: Patient is a 83 y.o. female who was seen today for occupational therapy treatment for of vision related impairments due to macular degeneration.  Pt appreciative of recommendations provided throughout treatment and patient is satisfied with discharge today and no longer demonstrates medical necessity for continued skilled occupational therapy services.  PERFORMANCE DEFICITS: in functional skills including ADLs, IADLs, coordination, sensation, ROM, strength, flexibility, Fine motor control, Gross motor control, hearing, mobility, balance, decreased knowledge of precautions, decreased knowledge of use of DME, vision, and UE functional use, cognitive skills including safety awareness, and psychosocial skills including coping  strategies, environmental adaptation, habits, and routines and behaviors.   IMPAIRMENTS: are limiting patient from ADLs, IADLs, rest and sleep, work, and leisure.   CO-MORBIDITIES: has co-morbidities such as arthritis and macular degeneration that affects occupational performance. Patient will benefit from skilled OT to address above impairments and improve overall function.  REHAB POTENTIAL: Good  PLAN:  OCCUPATIONAL THERAPY DISCHARGE SUMMARY  Visits from Start of Care: 5  Current functional level related to goals / functional outcomes: Pt has met all goals to satisfactory levels and is pleased with outcomes.   Remaining deficits: Pt has is aware of modifications and options to address visual functional deficits.   Education / Equipment: Pt has all needed materials and education. Pt understands how to continue on with self-management. See tx notes for more details.   Patient agrees to discharge due to max benefits received from outpatient occupational therapy at this time.      Clarita LITTIE Pride, OT 09/04/2023, 12:20 PM

## 2023-09-11 ENCOUNTER — Encounter: Admitting: Occupational Therapy

## 2023-09-11 ENCOUNTER — Ambulatory Visit: Admitting: Physical Therapy

## 2023-09-18 ENCOUNTER — Encounter: Admitting: Occupational Therapy

## 2023-09-18 ENCOUNTER — Ambulatory Visit: Admitting: Physical Therapy

## 2023-09-18 ENCOUNTER — Encounter: Payer: Self-pay | Admitting: Physical Therapy

## 2023-09-18 VITALS — BP 158/76 | HR 58

## 2023-09-18 DIAGNOSIS — R2689 Other abnormalities of gait and mobility: Secondary | ICD-10-CM

## 2023-09-18 DIAGNOSIS — R2681 Unsteadiness on feet: Secondary | ICD-10-CM

## 2023-09-18 DIAGNOSIS — Z9181 History of falling: Secondary | ICD-10-CM

## 2023-09-18 DIAGNOSIS — M6281 Muscle weakness (generalized): Secondary | ICD-10-CM

## 2023-09-18 DIAGNOSIS — R29898 Other symptoms and signs involving the musculoskeletal system: Secondary | ICD-10-CM

## 2023-09-18 DIAGNOSIS — R278 Other lack of coordination: Secondary | ICD-10-CM

## 2023-09-18 NOTE — Therapy (Signed)
 OUTPATIENT PHYSICAL THERAPY NEURO TREATMENT   Patient Name: Gladyse Corvin MRN: [id number] DOB:September 22, 1940, 83 y.o., female Today's Date: 09/18/2023   PCP: Roanna Ezekiel NOVAK, MD REFERRING PROVIDER: Roanna Ezekiel NOVAK, MD  END OF SESSION:  PT End of Session - 09/18/23 1027     Visit Number 6    Number of Visits 9   8 + eval   Date for PT Re-Evaluation 10/12/23   Pushed out due to potential multi-disciplinary scheduling delay   Authorization Type Medicare A&B, Secondary BCBS    Progress Note Due on Visit 10    PT Start Time 1017    PT Stop Time 1105    PT Time Calculation (min) 48 min    Equipment Utilized During Treatment Gait belt    Activity Tolerance Patient tolerated treatment well;Patient limited by fatigue;Other (comment)   stiffness   Behavior During Therapy WFL for tasks assessed/performed          Past Medical History:  Diagnosis Date   Allergic rhinitis, cause unspecified    Benign heart murmur    GERD (gastroesophageal reflux disease)    Hypertension    Inflamed sebaceous cyst    Insomnia, unspecified    Macular degeneration, wet (HCC)    Left eye   Urine incontinence    Vitamin D deficiency    Past Surgical History:  Procedure Laterality Date   APPENDECTOMY     BACK SURGERY     CERVICAL SPINE SURGERY     COLONOSCOPY  10/11   dexa scan  02/2010   DIAGNOSTIC MAMMOGRAM  04/2010   PELVIC FRACTURE SURGERY  1968   SHOULDER SURGERY     complete right, partial left   TUMOR REMOVAL     Patient Active Problem List   Diagnosis Date Noted   Primary osteoarthritis of both hands 09/28/2021   DDD (degenerative disc disease), cervical 09/21/2017   DDD (degenerative disc disease), lumbar 09/21/2017   Inflamed sebaceous cyst 12/04/2011   Hypertension 12/04/2011   GERD (gastroesophageal reflux disease) 12/04/2011   Allergic rhinitis 12/04/2011   Hyperlipidemia 12/04/2011    ONSET DATE: 2 weeks ago  REFERRING DIAG: R53.81 (ICD-10-CM) - Other  malaise  THERAPY DIAG:  Other lack of coordination  History of falling  Muscle weakness (generalized)  Other abnormalities of gait and mobility  Unsteadiness on feet  Other symptoms and signs involving the musculoskeletal system  Rationale for Evaluation and Treatment: Rehabilitation  SUBJECTIVE:  SUBJECTIVE STATEMENT: Pt denies pain or recent falls.  She reports her legs feel weak and she feels stiff today - thinks it could be weather and recent illness related.  She brought her cane today and reports this is helping her intermittently. Pt accompanied by: self (drives herself)  PERTINENT HISTORY: HTN, GERD, low vision, HLD, cervical and lumbar DDD, OA of both hands, bilateral hearing aides  PAIN:  Are you having pain? Yes: NPRS scale: 2 Pain location: bilateral anterior lateral shins Pain description: tingling Aggravating factors: nothing Relieving factors: nothing  PRECAUTIONS: Fall  RED FLAGS: None   WEIGHT BEARING RESTRICTIONS: No  FALLS: Has patient fallen in last 6 months? Yes. Number of falls 1 - tripped over german shepherd  LIVING ENVIRONMENT: Lives with: lives with their spouse Lives in: House/apartment Stairs: Yes: Internal: 3 flights steps; bilateral railing; she uses elevator in home to get from basement to 1st/2nd floor and External: 4 steps; bilateral but cannot reach both Has following equipment at home: Single point cane, Wheelchair (manual), Shower bench, Grab bars, and walking stick  PLOF: Independent  PATIENT GOALS: to not be afraid of falling  OBJECTIVE:  Note: Objective measures were completed at Evaluation unless otherwise noted.  DIAGNOSTIC FINDINGS: No recent or relevant imaging.  COGNITION: Overall cognitive status: Within functional limits for  tasks assessed   SENSATION: Light touch: WFL  COORDINATION: LE RAMS:  WNL Bilateral Heel-to-Shin:  WNL  EDEMA:  None significant in BLE  MUSCLE TONE: None noted in BLE  POSTURE: forward head (mild)  LOWER EXTREMITY ROM:     Active  Right Eval Left Eval  Hip flexion Grossly WNL  Hip extension   Hip abduction   Hip adduction   Hip internal rotation   Hip external rotation   Knee flexion   Knee extension   Ankle dorsiflexion   Ankle plantarflexion   Ankle inversion    Ankle eversion     (Blank rows = not tested)  LOWER EXTREMITY MMT:    MMT Right Eval Left Eval  Hip flexion 4 3+  Hip extension    Hip abduction    Hip adduction    Hip internal rotation    Hip external rotation    Knee flexion    Knee extension 4+; crepitus 5  Ankle dorsiflexion 5 5  Ankle plantarflexion    Ankle inversion    Ankle eversion    (Blank rows = not tested)  BED MOBILITY:  Findings: Sit to supine Complete Independence Supine to sit Complete Independence Rolling to Right Complete Independence Rolling to Left Complete Independence  TRANSFERS: Sit to stand: Complete Independence  Assistive device utilized: None     Stand to sit: Complete Independence  Assistive device utilized: None     Chair to chair: Complete Independence  Assistive device utilized: None       RAMP:  Not tested  CURB:  Not tested  STAIRS: Not tested GAIT: Findings: Gait Characteristics: step through pattern, decreased hip/knee flexion- Right, decreased hip/knee flexion- Left, and narrow BOS, Distance walked: various clinic distances, Assistive device utilized:None, Level of assistance: Complete Independence, and Comments: Mild right drift at very end of pathway.  FUNCTIONAL TESTS:  5 times sit to stand: 15.04 sec w/ BUE support 10 meter walk test: 7.50 sec SBA no AD = 1.33 m/sec OR 4.4 ft/sec Functional gait assessment:  TBA  Interpretation of scores: Non-Specific Older Adults Cutoff Score:  <=22/30 = risk of falls Parkinson's Disease Cutoff score <15/30= fall risk (  Hoehn & Yahr 1-4)  Minimally Clinically Important Difference (MCID)  Stroke (acute, subacute, and chronic) = MDC: 4.2 points Vestibular (acute) = MDC: 6 points Community Dwelling Older Adults =  MCID: 4 points Parkinson's Disease  =  MDC: 4.3 points  (Academy of Neurologic Physical Therapy (nd). Functional Gait Assessment. Retrieved from https://www.neuropt.org/docs/default-source/cpgs/core-outcome-measures/function-gait-assessment-pocket-guide-proof9-(2).pdf?sfvrsn=b29f35043_0.)  MCTSIB: TBA  PATIENT SURVEYS:  ABC scale: The Activities-Specific Balance Confidence (ABC) Scale 0% 10 20 30  40 50 60 70 80 90 100% No confidence<->completely confident  "How confident are you that you will not lose your balance or become unsteady when you . . .   Date tested 08/01/2023  Walk around the house 100%  2. Walk up or down stairs 50%  3. Bend over and pick up a slipper from in front of a closet floor 100%  4. Reach for a small can off a shelf at eye level 100%  5. Stand on tip toes and reach for something above your head 80%  6. Stand on a chair and reach for something 100%  7. Sweep the floor 100%  8. Walk outside the house to a car parked in the driveway 90%  9. Get into or out of a car 100%  10. Walk across a parking lot to the mall 100%  11. Walk up or down a ramp 50%  12. Walk in a crowded mall where people rapidly walk past you 100%  13. Are bumped into by people as you walk through the mall 90%  14. Step onto or off of an escalator while you are holding onto the railing 100%  15. Step onto or off an escalator while holding onto parcels such that you cannot hold onto the railing 100%  16. Walk outside on icy sidewalks 0%  Total: #/16 85%                                                                                                                                 TREATMENT DATE: 09/18/2023  -2lb dowel rod long  lever trunk rotation 2x10 each side -Seated thoracic extension against chair back x10 -Seated vertical thoracolumbar mobilization w/ chest opener 2x10 -4lb wood chops 2x10 each side -Circuit style standing march w/ 4lb long lever ball hold x10 > repeated seated x10; repeated x2 w/o rest -Standing deadbugs x 14 alternating sides > standing birddogs x12 each side -Seated overhead reach into crunch w/ heel raise 3x10 -Seated windmills 2x10 -Seated eccentric lean back for core engagement x12  PATIENT EDUCATION: Education details: Continue HEP w/ additions for core today. Person educated: Patient Education method: Medical illustrator Education comprehension: verbalized understanding  HOME EXERCISE PROGRAM: Access Code: [email or web address] URL: [email or web address] Date: 08/15/2023 Prepared by: Daved Bull  Exercises - Sit to Stand  - 1 x daily - 7 x weekly - 1-2 sets - 10 reps - Tandem Walking with Counter Support  - 1 x daily - 4 x weekly - 3  sets - 10 reps - Backward Walking with Counter Support  - 1 x daily - 4 x weekly - 3 sets - 10 reps - Walking with Eyes Closed and Counter Support  - 1 x daily - 4 x weekly - 3 sets - 10 reps - Standing Near Stance in Corner with Eyes Closed  - 1 x daily - 4 x weekly - 1 sets - 3 reps - 30 seconds hold - TL Sidebending Stretch - Single Arm Overhead  - 1 x daily - 7 x weekly - 3 sets - 10 reps - Seated Trunk Rotation Stretch  - 1 x daily - 7 x weekly - 3 sets - 10 reps - Seated Eccentric Abdominal Lean Back  - 1 x daily - 4 x weekly - 2 sets - 10 reps - Seated Windmill Trunk Rotation Stretch  - 1 x daily - 4 x weekly - 1-2 sets - 10 reps - Bird Dog on Counter  - 1 x daily - 4 x weekly - 1-2 sets - 10 reps  GOALS: Goals reviewed with patient? Yes  SHORT TERM GOALS: Target date: 08/31/2023  Pt will be independent and compliant with introductory strength and balance HEP in order to maintain functional progress and improve  mobility. Baseline:  IND and compliant (9/2) Goal status: MET  2.  Pt will improve FGA score to 24/30 in order to demonstrate improved balance and decreased fall risk. Baseline: 21/30 (8/13); 20/30 (9/2) Goal status: NOT MET  3.  mCTSIB to be assessed w/ STG set as appropriate. Baseline: LTG only Goal status: Revised - D/C'd (8/13)  LONG TERM GOALS: Target date: 09/28/2023  Pt will be independent and compliant with advanced and finalized strength and balance HEP in order to maintain functional progress and improve mobility. Baseline: Informally established on eval. Goal status: INITIAL  2.  Pt will improve ABC Scale score to >/=95% in order to decrease fall risk by demonstrating decreased fear of falling. Baseline: 85% Goal status: INITIAL  3.  Pt will improve FGA score to 24/30 in order to demonstrate improved balance and decreased fall risk. Baseline: 21/30 (8/13) Goal status: REVISED  4.  Pt to complete condition 4 of mCTSIB with average of 3 trials of 30 seconds in order to demonstrate improved vestibular function in relation to static balance. Baseline: 23.33 seconds avg (8/13) Goal status: INITIAL  5.  Pt will decrease 5xSTS to </=12 seconds w/o UE support in order to demonstrate decreased risk for falls and improved functional bilateral LE strength and power. 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  She tolerates all core work without increased pain response with good form.  Made additions to HEP to address this as pt has requested in prior visits.  She continues to benefit from skilled PT to improve use of Hurrycane for functional mobility and general strength and balance with respect to low vision challenges.  Will continue per POC.  OBJECTIVE IMPAIRMENTS: Abnormal gait, decreased balance, decreased  knowledge of use of DME, decreased strength, impaired vision/preception, improper body mechanics, and postural dysfunction.   ACTIVITY LIMITATIONS: carrying, lifting, standing, squatting, stairs, reach over head, and locomotion level  PARTICIPATION LIMITATIONS: meal prep, cleaning, community activity, and yard work  PERSONAL FACTORS: Age, Fitness, Past/current experiences, and 1-2 comorbidities: low vision, impaired hearing are also affecting patient's functional outcome.  REHAB POTENTIAL: Excellent  CLINICAL DECISION MAKING: Evolving/moderate complexity  EVALUATION COMPLEXITY: Moderate  PLAN:  PT FREQUENCY: 1x/week  PT DURATION: 8 weeks  PLANNED INTERVENTIONS: 97164- PT Re-evaluation, 97750- Physical Performance Testing, 97110-Therapeutic exercises, 97530- Therapeutic activity, [id number]- Neuromuscular re-education, 97535- Self Care, [id number]- Manual therapy, [phone]- Gait training, Patient/Family education, Balance training, Stair training, Vestibular training, and DME instructions  PLAN FOR NEXT SESSION: Hurrycane stair descent.  Expand HEP for high level balance and BLE strength.  Work into full tandem stance.  Therastones.  Compliant surface ramp/decline tasks. Glut/quad strength.  SciFit/treadmill endurance.  Some discomfort w/ STS/other knee flexion activities due to knee pain w/ crepitus  Daved KATHEE Bull, PT, DPT 09/18/2023, 11:09 AM

## 2023-09-18 NOTE — Patient Instructions (Signed)
 Access Code: [email or web address] URL: [email or web address] Date: 09/18/2023 Prepared by: Daved Bull  Exercises - Sit to Stand  - 1 x daily - 7 x weekly - 1-2 sets - 10 reps - Tandem Walking with Counter Support  - 1 x daily - 4 x weekly - 3 sets - 10 reps - Backward Walking with Counter Support  - 1 x daily - 4 x weekly - 3 sets - 10 reps - Walking with Eyes Closed and Counter Support  - 1 x daily - 4 x weekly - 3 sets - 10 reps - Standing Near Stance in Corner with Eyes Closed  - 1 x daily - 4 x weekly - 1 sets - 3 reps - 30 seconds hold - TL Sidebending Stretch - Single Arm Overhead  - 1 x daily - 7 x weekly - 3 sets - 10 reps - Seated Trunk Rotation Stretch  - 1 x daily - 7 x weekly - 3 sets - 10 reps - Seated Eccentric Abdominal Lean Back  - 1 x daily - 4 x weekly - 2 sets - 10 reps - Seated Windmill Trunk Rotation Stretch  - 1 x daily - 4 x weekly - 1-2 sets - 10 reps - Bird Dog on Counter  - 1 x daily - 4 x weekly - 1-2 sets - 10 reps

## 2023-09-25 ENCOUNTER — Encounter: Admitting: Occupational Therapy

## 2023-09-25 ENCOUNTER — Ambulatory Visit: Admitting: Physical Therapy

## 2023-10-02 ENCOUNTER — Emergency Department (HOSPITAL_COMMUNITY)

## 2023-10-02 ENCOUNTER — Other Ambulatory Visit: Payer: Self-pay

## 2023-10-02 ENCOUNTER — Ambulatory Visit: Admitting: Physical Therapy

## 2023-10-02 ENCOUNTER — Encounter: Admitting: Occupational Therapy

## 2023-10-02 ENCOUNTER — Emergency Department (HOSPITAL_COMMUNITY): Admission: EM | Admit: 2023-10-02 | Discharge: 2023-10-02 | Disposition: A | Discharge status: Home / Self Care

## 2023-10-02 DIAGNOSIS — K219 Gastro-esophageal reflux disease without esophagitis: Secondary | ICD-10-CM | POA: Insufficient documentation

## 2023-10-02 DIAGNOSIS — R55 Syncope and collapse: Secondary | ICD-10-CM | POA: Diagnosis present

## 2023-10-02 DIAGNOSIS — I10 Essential (primary) hypertension: Secondary | ICD-10-CM | POA: Insufficient documentation

## 2023-10-02 DIAGNOSIS — R519 Headache, unspecified: Secondary | ICD-10-CM | POA: Diagnosis present

## 2023-10-02 DIAGNOSIS — Z79899 Other long term (current) drug therapy: Secondary | ICD-10-CM | POA: Diagnosis not present

## 2023-10-02 DIAGNOSIS — I159 Secondary hypertension, unspecified: Secondary | ICD-10-CM | POA: Insufficient documentation

## 2023-10-02 LAB — BASIC METABOLIC PANEL WITH GFR
Anion gap: 12 (ref 5–15)
BUN: 17 mg/dL (ref 8–23)
CO2: 23 mmol/L (ref 22–32)
Calcium: 9.8 mg/dL (ref 8.9–10.3)
Chloride: 104 mmol/L (ref 98–111)
Creatinine, Ser: 0.7 mg/dL (ref 0.44–1.00)
GFR, Estimated: >60 mL/min (ref >60)
Glucose, Bld: 102 mg/dL — ABNORMAL HIGH (ref 70–99)
Potassium: 4.3 mmol/L (ref 3.5–5.1)
Sodium: 139 mmol/L (ref 135–145)

## 2023-10-02 LAB — TROPONIN I (HIGH SENSITIVITY): Troponin I (High Sensitivity): 6 ng/L (ref <18)

## 2023-10-02 LAB — CBC
HCT: 44.7 % (ref 36.0–46.0)
Hemoglobin: 14.7 g/dL (ref 12.0–15.0)
MCH: 30.8 pg (ref 26.0–34.0)
MCHC: 32.9 g/dL (ref 30.0–36.0)
MCV: 93.5 fL (ref 80.0–100.0)
Platelets: 171 K/uL (ref 150–400)
RBC: 4.78 MIL/uL (ref 3.87–5.11)
RDW: 12.6 % (ref 11.5–15.5)
WBC: 4.8 K/uL (ref 4.0–10.5)
nRBC: 0 % (ref 0.0–0.2)

## 2023-10-02 MED ORDER — HYDRALAZINE HCL 25 MG PO TABS: 50.0000 mg | ORAL_TABLET | Freq: Once | ORAL | Status: DC

## 2023-10-02 MED ORDER — HYDRALAZINE HCL 20 MG/ML IJ SOLN: 10.0000 mg | Freq: Once | INTRAMUSCULAR | Status: DC

## 2023-10-02 MED ORDER — AMLODIPINE BESYLATE 5 MG PO TABS
10.0000 mg | ORAL_TABLET | Freq: Once | ORAL | Status: AC
  Administered 2023-10-02: 10 mg via ORAL
  Filled 2023-10-02: qty 2

## 2023-10-02 NOTE — ED Notes (Signed)
 Pt denies chest pain/SHOB/dizziness but does report some blurry vision.

## 2023-10-02 NOTE — ED Provider Notes (Signed)
 [place] EMERGENCY DEPARTMENT AT [facility] Provider Note   CSN: [id number] Arrival date & time: 10/02/23  8279     Patient presents with: Hypertension   Brittney Stewart is a 83 y.o. female.   This is an 83 year old female presenting to the emergency department for evaluation of elevated blood pressure.  She was at the doctor's office today found to be hypertensive with blood pressure in the 200s.  Reportedly she felt woozy and flushed, but did not pass out.  Had mild headache that has improved.  No stroke symptoms.  Not having chest pain, no shortness of breath.  Making normal urine.   Hypertension       Prior to Admission medications   Medication Sig Start Date End Date Taking? Authorizing Provider  Acetaminophen (TYLENOL PO) Take by mouth as needed.    [provider]  amLODipine (NORVASC) 10 MG tablet Patient is taking 5 mg in the morning and 10 mg at night. 04/23/19   [provider]  Ascorbic Acid (VITAMIN C PO) Take 1 tablet by mouth daily.     [provider]  CALCIUM PO Take 500 mg by mouth daily.     [provider]  celecoxib (CELEBREX) 200 MG capsule Take 200 mg by mouth daily.    [provider]  cetirizine (ZYRTEC) 10 MG tablet Take 10 mg by mouth as needed. 01/10/21   [provider]  Cinnamon 500 MG capsule Take 500 mg by mouth daily. Patient not taking: Reported on 10/25/2022    [provider]  ciprofloxacin (CIPRO) 500 MG tablet 1 tab(s) orally every 12 hours for 7 days Patient not taking: Reported on 07/28/2022 04/27/22   [provider]  Coenzyme Q10 (COQ10) 200 MG CAPS Take 1 capsule by mouth daily.    [provider]  diclofenac  Sodium (VOLTAREN ) 1 % GEL Apply topically as needed.    [provider]  ezetimibe (ZETIA) 10 MG tablet Take 10 mg by mouth daily. 11/15/20   [provider]  fluorouracil (EFUDEX) 5 % cream Apply topically 2 (two) times  daily. Patient not taking: Reported on 07/28/2022 04/13/22   [provider]  fluticasone (FLONASE) 50 MCG/ACT nasal spray Place 1 spray into both nostrils daily. 01/03/21   [provider]  Homeopathic Products (ARNICARE EX) Apply topically as needed.    [provider]  hydrOXYzine (VISTARIL) 25 MG capsule Take 25 mg by mouth at bedtime as needed. Patient not taking: Reported on 07/28/2022 09/23/20   [provider]  Lido-Capsaicin-Men-Methyl Sal (MEDI-PATCH-LIDOCAINE  EX) Apply topically as needed.    [provider]  Magnesium Oxide 250 MG TABS Take 250 mg by mouth daily.     [provider]  Multiple Vitamins-Minerals (PRESERVISION AREDS 2 PO) Take 2 capsules by mouth daily.    [provider]  Multiple Vitamins-Minerals (ZINC PO) Take 1 tablet by mouth daily.  Patient not taking: Reported on 08/01/2023    [provider]  nitroGLYCERIN (NITROSTAT) 0.4 MG SL tablet Place 0.4 mg under the tongue every 5 (five) minutes as needed for chest pain.    [provider]  olmesartan (BENICAR) 40 MG tablet Take 40 mg by mouth daily. 12/01/19   [provider]  pantoprazole (PROTONIX) 40 MG tablet Take 40 mg by mouth daily. Patient taking differently: Take 40 mg by mouth as needed. 09/10/19   [provider]  REPATHA SURECLICK 140 MG/ML SOAJ Inject 1 mL into the skin  every 14 (fourteen) days. 10/09/22   [provider]  SALINE NASAL SPRAY NA Place into the nose as needed.    [provider]  VASCEPA 1 g capsule Take 2 g by mouth 2 (two) times daily. 12/01/19   [provider]  vitamin B-12 (CYANOCOBALAMIN) 1000 MCG tablet Take 1,000 mcg by mouth daily.    [provider]  Vitamin D, Ergocalciferol, (DRISDOL) 1.25 MG (50000 UNIT) CAPS capsule Take 50,000 Units by mouth every 14 (fourteen) days. Patient not taking: Reported on 08/01/2023 05/06/19   [provider]     Allergies: Penicillin v potassium    Review of Systems  Updated Vital Signs BP (!) 159/66   Pulse (!) 58   Temp 98.5 F (36.9 C)   Resp 17   Ht 5' 5 (1.651 m)   Wt 79 kg   SpO2 98%   BMI 28.98 kg/m   Physical Exam Vitals and nursing note reviewed.  Constitutional:      General: She is not in acute distress. HENT:     Head: Normocephalic.     Mouth/Throat:     Mouth: Mucous membranes are moist.  Eyes:     Extraocular Movements: Extraocular movements intact.     Conjunctiva/sclera: Conjunctivae normal.  Cardiovascular:     Rate and Rhythm: Normal rate and regular rhythm.  Pulmonary:     Effort: Pulmonary effort is normal.     Breath sounds: Normal breath sounds.  Abdominal:     General: Abdomen is flat. There is no distension.     Palpations: Abdomen is soft.     Tenderness: There is no abdominal tenderness. There is no guarding or rebound.  Musculoskeletal:        General: Normal range of motion.     Cervical back: Normal range of motion.  Skin:    General: Skin is warm and dry.     Capillary Refill: Capillary refill takes less than 2 seconds.  Neurological:     General: No focal deficit present.     Mental Status: She is alert and oriented to person, place, and time.  Psychiatric:        Mood and Affect: Mood normal.        Behavior: Behavior normal.     (all labs ordered are listed, but only abnormal results are displayed) Labs Reviewed  BASIC METABOLIC PANEL WITH GFR - Abnormal; Notable for the following components:      Result Value   Glucose, Bld 102 (*)    All other components within normal limits  CBC  TROPONIN I (HIGH SENSITIVITY)    EKG: EKG Interpretation Date/Time:  Tuesday October 02 2023 18:28:41 EDT Ventricular Rate:  64 PR Interval:  190 QRS Duration:  105 QT Interval:  412 QTC Calculation: 426 R Axis:   69  Text Interpretation: Sinus rhythm Confirmed by Neysa Clap [id number]) on 10/02/2023 7:35:15 PM  Radiology: CT Head  Wo Contrast Result Date: 10/02/2023 EXAM: CT HEAD WITHOUT CONTRAST 10/02/2023 06:56:00 PM TECHNIQUE: CT of the head was performed without the administration of intravenous contrast. Automated exposure control, iterative reconstruction, and/or weight based adjustment of the mA/kV was utilized to reduce the radiation dose to as low as reasonably achievable. COMPARISON: MRI 06/26/2022. CLINICAL HISTORY: Headache, new onset (Age >= 51y). FINDINGS: BRAIN AND VENTRICLES: No acute hemorrhage. No evidence of acute infarct. No hydrocephalus. No extra-axial collection. No mass effect or midline shift. ORBITS: No acute abnormality. SINUSES: Chronic right maxillary sinusitis. SOFT  TISSUES AND SKULL: No acute soft tissue abnormality. No skull fracture. IMPRESSION: 1. No acute intracranial abnormality. Electronically signed by: Norman Gatlin MD 10/02/2023 07:25 PM EDT RP Workstation: [id number]   DG Chest Portable 1 View Result Date: 10/02/2023 CLINICAL DATA:  Near syncope. EXAM: PORTABLE CHEST 1 VIEW COMPARISON:  September 29, 2021 FINDINGS: The heart size and mediastinal contours are within normal limits. There is moderate severity calcification of the aortic arch. No acute infiltrate, pleural effusion or pneumothorax is identified. There is evidence of prior bilateral shoulder arthroplasty. Postoperative changes are seen within the lower cervical spine. Degenerative changes are present throughout the thoracic spine. IMPRESSION: No active cardiopulmonary disease. Electronically Signed   By: Suzen Dials M.D.   On: 10/02/2023 18:58     Procedures   Medications Ordered in the ED  amLODipine (NORVASC) tablet 10 mg (10 mg Oral Given 10/02/23 1955)    Clinical Course as of 10/02/23 2344  Tue Oct 02, 2023  1938 Blood pressure has been trending down was currently 176 systolic.  Patient remains asymptomatic.  Lab workup today reassuring.  CT head without acute intracranial process.  Chest x-ray with no pulmonary  edema.  Troponin not elevated.  Not having chest pain.  EKG without ischemic changes.  No leukocytosis or anemia.  Normal kidney function.  She has no sign of endorgan damage.  Patient's PCP is actually in the room and notes that she has steadily come down on her medications and would recommend 10 mg of Norvasc.  I agree.  It is ordered.  Given ACEPs guidelines on asymptomatic hypertension will discharge in stable condition, and has close follow-up with PCP [TY]    Clinical Course User Index [TY] Neysa Caron PARAS, DO                                 Medical Decision Making This is a well-appearing 83 year old female presenting to the emergency department for elevated blood pressure.  Has a history of hypertension, GERD as well as macular degeneration.  Initial blood pressure in the 200s systolic.  Otherwise normal vital signs.  Physical exam without localizing deficits and she essentially is asymptomatic at this point.  However she did have mild headache earlier, and felt flushed/woozy.  Will get screening labs, CT head, chest x-ray and EKG.  Patient's PCP actually came to visit in the room and noted that they have been having issues for the past 10 days or so with patient's blood pressure and has been taking hydralazine  as needed.  Amount and/or Complexity of Data Reviewed External Data Reviewed:     Details: Appears to be taking multiple antihypertensives Labs: ordered. Decision-making details documented in ED Course. Radiology: ordered and independent interpretation performed.    Details: No pneumonia pneumothorax ECG/medicine tests: ordered and independent interpretation performed.    Details: No ischemic changes.  Sinus rhythm  Risk Prescription drug management. Decision regarding hospitalization. Risk Details: See ED course       Final diagnoses:  Secondary hypertension    ED Discharge Orders     None          Neysa Caron PARAS, DO 10/02/23 2344

## 2023-10-02 NOTE — Discharge Instructions (Signed)
 Please continue to take your blood pressure medications.  Continue to work closely and follow directions of your primary doctor regarding medication adjustments.  Return if develop fevers, chills, severe headache, vision loss, facial droop, unilateral weakness, chest pain, shortness of breath, uncontrolled nausea vomiting, stop making urine or any new or worsening symptoms that are concerning to you.

## 2023-10-02 NOTE — ED Triage Notes (Addendum)
 Pt was a doctors office for a routine visit and found to be hypertensive. Was hypertensive with EMS, appears flushed, and reports a mlid headache that started earlier today. Reports med compliance at home.   210/30 at doctors 216/110 with EMS

## 2023-10-03 ENCOUNTER — Other Ambulatory Visit: Payer: Self-pay | Admitting: Internal Medicine

## 2023-10-03 DIAGNOSIS — K862 Cyst of pancreas: Secondary | ICD-10-CM

## 2023-10-05 NOTE — Progress Notes (Deleted)
 Office Visit Note  Patient: Brittney Stewart             Date of Birth: [birth year]           MRN: [id number]             PCP: Roanna Ezekiel NOVAK, MD Referring: Roanna Ezekiel NOVAK, MD Visit Date: 10/19/2023 Occupation: Data Unavailable  Subjective:  No chief complaint on file.   History of Present Illness: Brittney Stewart is a 83 y.o. female ***     Activities of Daily Living:  Patient reports morning stiffness for *** {minute/hour:19697}.   Patient {ACTIONS;DENIES/REPORTS:21021675::Denies} nocturnal pain.  Difficulty dressing/grooming: {ACTIONS;DENIES/REPORTS:21021675::Denies} Difficulty climbing stairs: {ACTIONS;DENIES/REPORTS:21021675::Denies} Difficulty getting out of chair: {ACTIONS;DENIES/REPORTS:21021675::Denies} Difficulty using hands for taps, buttons, cutlery, and/or writing: {ACTIONS;DENIES/REPORTS:21021675::Denies}  No Rheumatology ROS completed.   PMFS History:  Patient Active Problem List   Diagnosis Date Noted   Primary osteoarthritis of both hands 09/28/2021   DDD (degenerative disc disease), cervical 09/21/2017   DDD (degenerative disc disease), lumbar 09/21/2017   Inflamed sebaceous cyst 12/04/2011   Hypertension 12/04/2011   GERD (gastroesophageal reflux disease) 12/04/2011   Allergic rhinitis 12/04/2011   Hyperlipidemia 12/04/2011    Past Medical History:  Diagnosis Date   Allergic rhinitis, cause unspecified    Benign heart murmur    GERD (gastroesophageal reflux disease)    Hypertension    Inflamed sebaceous cyst    Insomnia, unspecified    Macular degeneration, wet (HCC)    Left eye   Urine incontinence    Vitamin D deficiency     Family History  Problem Relation Age of Onset   Lung cancer Mother    Arthritis Mother    Hypertension Mother    Hypertension Father    Stroke Father    Diabetes Father    Diabetes Sister    Cancer Sister        brain tumor   Hypertension Sister    Depression Sister    Hypertension Son     Past Surgical History:  Procedure Laterality Date   APPENDECTOMY     BACK SURGERY     CERVICAL SPINE SURGERY     COLONOSCOPY  10/11   dexa scan  02/2010   DIAGNOSTIC MAMMOGRAM  04/2010   PELVIC FRACTURE SURGERY  1968   SHOULDER SURGERY     complete right, partial left   TUMOR REMOVAL     Social History   Tobacco Use   Smoking status: Former    Current packs/day: 0.00    Average packs/day: 0.5 packs/day for 4.0 years (2.0 ttl pk-yrs)    Types: Cigarettes    Start date: 11/27/1963    Quit date: 11/27/1967    Years since quitting: 55.8    Passive exposure: Never   Smokeless tobacco: Never  Vaping Use   Vaping status: Never Used  Substance Use Topics   Alcohol use: Yes    Alcohol/week: 2.0 standard drinks of alcohol    Types: 1 Glasses of wine, 1 Cans of beer per week    Comment: occ   Drug use: Never   Social History   Social History Narrative   Not on file     Immunization History  Administered Date(s) Administered   Influenza-Unspecified 01/02/2018   PFIZER(Purple Top)SARS-COV-2 Vaccination 01/17/2019, 02/07/2019, 10/30/2019   Pfizer(Comirnaty)Fall Seasonal Vaccine 12 years and older 09/12/2022, 09/19/2023   Unspecified SARS-COV-2 Vaccination 10/06/2021     Objective: Vital Signs: There were no vitals taken for this visit.  Physical Exam   Musculoskeletal Exam: ***  CDAI Exam: CDAI Score: -- Patient Global: --; Provider Global: -- Swollen: --; Tender: -- Joint Exam 10/19/2023   No joint exam has been documented for this visit   There is currently no information documented on the homunculus. Go to the Rheumatology activity and complete the homunculus joint exam.  Investigation: No additional findings.  Imaging: CT Head Wo Contrast Result Date: 10/02/2023 EXAM: CT HEAD WITHOUT CONTRAST 10/02/2023 06:56:00 PM TECHNIQUE: CT of the head was performed without the administration of intravenous contrast. Automated exposure control, iterative  reconstruction, and/or weight based adjustment of the mA/kV was utilized to reduce the radiation dose to as low as reasonably achievable. COMPARISON: MRI 06/26/2022. CLINICAL HISTORY: Headache, new onset (Age >= 51y). FINDINGS: BRAIN AND VENTRICLES: No acute hemorrhage. No evidence of acute infarct. No hydrocephalus. No extra-axial collection. No mass effect or midline shift. ORBITS: No acute abnormality. SINUSES: Chronic right maxillary sinusitis. SOFT TISSUES AND SKULL: No acute soft tissue abnormality. No skull fracture. IMPRESSION: 1. No acute intracranial abnormality. Electronically signed by: Norman Gatlin MD 10/02/2023 07:25 PM EDT RP Workstation: [id number]   DG Chest Portable 1 View Result Date: 10/02/2023 CLINICAL DATA:  Near syncope. EXAM: PORTABLE CHEST 1 VIEW COMPARISON:  September 29, 2021 FINDINGS: The heart size and mediastinal contours are within normal limits. There is moderate severity calcification of the aortic arch. No acute infiltrate, pleural effusion or pneumothorax is identified. There is evidence of prior bilateral shoulder arthroplasty. Postoperative changes are seen within the lower cervical spine. Degenerative changes are present throughout the thoracic spine. IMPRESSION: No active cardiopulmonary disease. Electronically Signed   By: Suzen Dials M.D.   On: 10/02/2023 18:58    Recent Labs: Lab Results  Component Value Date   WBC 4.8 10/02/2023   HGB 14.7 10/02/2023   PLT 171 10/02/2023   NA 139 10/02/2023   K 4.3 10/02/2023   CL 104 10/02/2023   CO2 23 10/02/2023   GLUCOSE 102 (H) 10/02/2023   BUN 17 10/02/2023   CREATININE 0.70 10/02/2023   BILITOT 1.2 02/03/2008   ALKPHOS 69 02/03/2008   AST 23 02/03/2008   ALT 15 02/03/2008   PROT 6.9 01/26/2021   ALBUMIN 3.7 02/03/2008   CALCIUM 9.8 10/02/2023   GFRAA >60 07/02/2016    Speciality Comments: No specialty comments available.  Procedures:  No procedures performed Allergies: Penicillin v potassium    Assessment / Plan:     Visit Diagnoses: No diagnosis found.  Orders: No orders of the defined types were placed in this encounter.  No orders of the defined types were placed in this encounter.   Face-to-face time spent with patient was *** minutes. Greater than 50% of time was spent in counseling and coordination of care.  Follow-Up Instructions: No follow-ups on file.   Daved JAYSON Gavel, CMA  Note - This record has been created using Animal nutritionist.  Chart creation errors have been sought, but may not always  have been located. Such creation errors do not reflect on  the standard of medical care.

## 2023-10-08 ENCOUNTER — Ambulatory Visit: Admitting: Physical Therapy

## 2023-10-15 ENCOUNTER — Ambulatory Visit
Admission: RE | Admit: 2023-10-15 | Discharge: 2023-10-15 | Disposition: A | Discharge status: Home / Self Care | Source: Ambulatory Visit | Attending: Internal Medicine | Admitting: Internal Medicine

## 2023-10-15 DIAGNOSIS — K862 Cyst of pancreas: Secondary | ICD-10-CM

## 2023-10-15 MED ORDER — GADOPICLENOL 0.5 MMOL/ML IV SOLN
8.0000 mL | Freq: Once | INTRAVENOUS | Status: AC | PRN
  Administered 2023-10-15: 8 mL via INTRAVENOUS

## 2023-10-19 ENCOUNTER — Ambulatory Visit: Payer: Medicare Other | Admitting: Rheumatology

## 2023-10-19 DIAGNOSIS — M51369 Other intervertebral disc degeneration, lumbar region without mention of lumbar back pain or lower extremity pain: Secondary | ICD-10-CM

## 2023-10-19 DIAGNOSIS — M2142 Flat foot [pes planus] (acquired), left foot: Secondary | ICD-10-CM

## 2023-10-19 DIAGNOSIS — M5030 Other cervical disc degeneration, unspecified cervical region: Secondary | ICD-10-CM

## 2023-10-19 DIAGNOSIS — G8929 Other chronic pain: Secondary | ICD-10-CM

## 2023-10-19 DIAGNOSIS — R7689 Other specified abnormal immunological findings in serum: Secondary | ICD-10-CM

## 2023-10-19 DIAGNOSIS — Z96612 Presence of left artificial shoulder joint: Secondary | ICD-10-CM

## 2023-10-19 DIAGNOSIS — Z889 Allergy status to unspecified drugs, medicaments and biological substances status: Secondary | ICD-10-CM

## 2023-10-19 DIAGNOSIS — Z13820 Encounter for screening for osteoporosis: Secondary | ICD-10-CM

## 2023-10-19 DIAGNOSIS — Z96611 Presence of right artificial shoulder joint: Secondary | ICD-10-CM

## 2023-10-19 DIAGNOSIS — M720 Palmar fascial fibromatosis [Dupuytren]: Secondary | ICD-10-CM

## 2023-10-19 DIAGNOSIS — Z8719 Personal history of other diseases of the digestive system: Secondary | ICD-10-CM

## 2023-10-19 DIAGNOSIS — M170 Bilateral primary osteoarthritis of knee: Secondary | ICD-10-CM

## 2023-10-19 DIAGNOSIS — Z8639 Personal history of other endocrine, nutritional and metabolic disease: Secondary | ICD-10-CM

## 2023-10-19 DIAGNOSIS — M19041 Primary osteoarthritis, right hand: Secondary | ICD-10-CM

## 2023-10-19 DIAGNOSIS — Z8781 Personal history of (healed) traumatic fracture: Secondary | ICD-10-CM

## 2023-10-19 DIAGNOSIS — Z8679 Personal history of other diseases of the circulatory system: Secondary | ICD-10-CM

## 2024-01-15 ENCOUNTER — Other Ambulatory Visit: Payer: Self-pay | Admitting: Obstetrics and Gynecology

## 2024-01-15 DIAGNOSIS — N6311 Unspecified lump in the right breast, upper outer quadrant: Secondary | ICD-10-CM

## 2024-02-05 ENCOUNTER — Ambulatory Visit
Admission: RE | Admit: 2024-02-05 | Discharge: 2024-02-05 | Disposition: A | Source: Ambulatory Visit | Attending: Obstetrics and Gynecology | Admitting: Obstetrics and Gynecology

## 2024-02-05 DIAGNOSIS — N6311 Unspecified lump in the right breast, upper outer quadrant: Secondary | ICD-10-CM
